# Patient Record
Sex: Male | Born: 1970
Health system: Southern US, Community
[De-identification: ages and names within clinical notes are randomized; demographics above are authoritative.]

## PROBLEM LIST (undated history)

## (undated) DIAGNOSIS — I4891 Unspecified atrial fibrillation: Secondary | ICD-10-CM

## (undated) DIAGNOSIS — K219 Gastro-esophageal reflux disease without esophagitis: Secondary | ICD-10-CM

## (undated) DIAGNOSIS — H18603 Keratoconus, unspecified, bilateral: Secondary | ICD-10-CM

## (undated) DIAGNOSIS — G4730 Sleep apnea, unspecified: Secondary | ICD-10-CM

## (undated) DIAGNOSIS — F419 Anxiety disorder, unspecified: Secondary | ICD-10-CM

## (undated) DIAGNOSIS — K5900 Constipation, unspecified: Secondary | ICD-10-CM

## (undated) DIAGNOSIS — I10 Essential (primary) hypertension: Secondary | ICD-10-CM

## (undated) DIAGNOSIS — R251 Tremor, unspecified: Secondary | ICD-10-CM

## (undated) DIAGNOSIS — J302 Other seasonal allergic rhinitis: Secondary | ICD-10-CM

## (undated) DIAGNOSIS — J309 Allergic rhinitis, unspecified: Secondary | ICD-10-CM

## (undated) DIAGNOSIS — D649 Anemia, unspecified: Secondary | ICD-10-CM

## (undated) DIAGNOSIS — F32A Depression, unspecified: Secondary | ICD-10-CM

## (undated) DIAGNOSIS — Z8614 Personal history of Methicillin resistant Staphylococcus aureus infection: Secondary | ICD-10-CM

## (undated) HISTORY — DX: Unspecified atrial fibrillation: I48.91

## (undated) HISTORY — DX: Depression, unspecified: F32.A

## (undated) HISTORY — DX: Tremor, unspecified: R25.1

## (undated) HISTORY — DX: Allergic rhinitis, unspecified: J30.9

## (undated) HISTORY — DX: Keratoconus, unspecified, bilateral: H18.603

## (undated) HISTORY — DX: Anxiety disorder, unspecified: F41.9

## (undated) HISTORY — DX: Gastro-esophageal reflux disease without esophagitis: K21.9

## (undated) HISTORY — DX: Constipation, unspecified: K59.00

## (undated) HISTORY — DX: Personal history of Methicillin resistant Staphylococcus aureus infection: Z86.14

## (undated) HISTORY — DX: Anemia, unspecified: D64.9

## (undated) HISTORY — DX: Essential (primary) hypertension: I10

## (undated) HISTORY — DX: Sleep apnea, unspecified: G47.30

## (undated) HISTORY — DX: Other seasonal allergic rhinitis: J30.2

---

## 1997-09-29 ENCOUNTER — Emergency Department (HOSPITAL_COMMUNITY): Admission: EM | Admit: 1997-09-29 | Discharge: 1997-09-29 | Payer: Self-pay | Admitting: Emergency Medicine

## 1997-09-29 ENCOUNTER — Ambulatory Visit (HOSPITAL_COMMUNITY): Admission: RE | Admit: 1997-09-29 | Discharge: 1997-09-29 | Payer: Self-pay | Admitting: Emergency Medicine

## 2000-04-06 ENCOUNTER — Emergency Department (HOSPITAL_COMMUNITY): Admission: EM | Admit: 2000-04-06 | Discharge: 2000-04-06 | Payer: Self-pay | Admitting: Emergency Medicine

## 2000-04-11 ENCOUNTER — Emergency Department (HOSPITAL_COMMUNITY): Admission: EM | Admit: 2000-04-11 | Discharge: 2000-04-11 | Payer: Self-pay | Admitting: Internal Medicine

## 2001-07-22 ENCOUNTER — Encounter: Payer: Self-pay | Admitting: Emergency Medicine

## 2001-07-22 ENCOUNTER — Emergency Department (HOSPITAL_COMMUNITY): Admission: EM | Admit: 2001-07-22 | Discharge: 2001-07-22 | Payer: Self-pay | Admitting: *Deleted

## 2001-11-29 ENCOUNTER — Encounter: Payer: Self-pay | Admitting: Emergency Medicine

## 2001-11-29 ENCOUNTER — Emergency Department (HOSPITAL_COMMUNITY): Admission: EM | Admit: 2001-11-29 | Discharge: 2001-11-29 | Payer: Self-pay

## 2004-01-06 ENCOUNTER — Ambulatory Visit: Payer: Self-pay | Admitting: Internal Medicine

## 2004-04-08 ENCOUNTER — Emergency Department (HOSPITAL_COMMUNITY): Admission: EM | Admit: 2004-04-08 | Discharge: 2004-04-08 | Payer: Self-pay | Admitting: Family Medicine

## 2004-04-12 ENCOUNTER — Emergency Department (HOSPITAL_COMMUNITY): Admission: EM | Admit: 2004-04-12 | Discharge: 2004-04-12 | Payer: Self-pay | Admitting: Family Medicine

## 2004-08-01 ENCOUNTER — Emergency Department (HOSPITAL_COMMUNITY): Admission: EM | Admit: 2004-08-01 | Discharge: 2004-08-01 | Payer: Self-pay | Admitting: Emergency Medicine

## 2004-09-08 ENCOUNTER — Emergency Department (HOSPITAL_COMMUNITY): Admission: EM | Admit: 2004-09-08 | Discharge: 2004-09-08 | Payer: Self-pay | Admitting: Emergency Medicine

## 2004-09-11 ENCOUNTER — Emergency Department (HOSPITAL_COMMUNITY): Admission: AD | Admit: 2004-09-11 | Discharge: 2004-09-11 | Payer: Self-pay | Admitting: Family Medicine

## 2005-06-05 ENCOUNTER — Emergency Department (HOSPITAL_COMMUNITY): Admission: EM | Admit: 2005-06-05 | Discharge: 2005-06-05 | Payer: Self-pay | Admitting: Family Medicine

## 2005-11-07 ENCOUNTER — Emergency Department (HOSPITAL_COMMUNITY): Admission: EM | Admit: 2005-11-07 | Discharge: 2005-11-07 | Payer: Self-pay | Admitting: Emergency Medicine

## 2006-01-12 ENCOUNTER — Emergency Department (HOSPITAL_COMMUNITY): Admission: EM | Admit: 2006-01-12 | Discharge: 2006-01-12 | Payer: Self-pay | Admitting: Family Medicine

## 2006-02-23 ENCOUNTER — Ambulatory Visit: Payer: Self-pay | Admitting: Internal Medicine

## 2006-12-31 ENCOUNTER — Encounter: Payer: Self-pay | Admitting: *Deleted

## 2006-12-31 DIAGNOSIS — Z8679 Personal history of other diseases of the circulatory system: Secondary | ICD-10-CM | POA: Insufficient documentation

## 2006-12-31 DIAGNOSIS — I10 Essential (primary) hypertension: Secondary | ICD-10-CM | POA: Insufficient documentation

## 2007-02-25 ENCOUNTER — Emergency Department (HOSPITAL_COMMUNITY): Admission: EM | Admit: 2007-02-25 | Discharge: 2007-02-25 | Payer: Self-pay | Admitting: Family Medicine

## 2007-03-06 ENCOUNTER — Emergency Department (HOSPITAL_COMMUNITY): Admission: EM | Admit: 2007-03-06 | Discharge: 2007-03-06 | Payer: Self-pay | Admitting: Family Medicine

## 2007-06-05 ENCOUNTER — Encounter: Payer: Self-pay | Admitting: Family

## 2010-01-05 ENCOUNTER — Encounter: Payer: Self-pay | Admitting: Family

## 2010-03-20 ENCOUNTER — Encounter: Payer: Self-pay | Admitting: Family

## 2010-03-22 ENCOUNTER — Ambulatory Visit: Admit: 2010-03-22 | Payer: Self-pay | Admitting: Family

## 2010-03-25 ENCOUNTER — Encounter: Payer: Self-pay | Admitting: Family

## 2010-03-25 ENCOUNTER — Ambulatory Visit: Payer: Self-pay | Admitting: Family

## 2010-03-25 DIAGNOSIS — I517 Cardiomegaly: Secondary | ICD-10-CM | POA: Insufficient documentation

## 2010-03-30 NOTE — Assessment & Plan Note (Signed)
Summary: TO RE-EST/HEA-- rm 5   Vital Signs:  Patient profile:   40 year old male Height:      68.75 inches Weight:      230 pounds BMI:     34.34 Temp:     98.5 degrees F oral Pulse rate:   60 / minute Pulse rhythm:   regular Resp:     18 per minute BP sitting:   126 / 70  (right arm) Cuff size:   large  Vitals Entered By: Mervin Kung CMA Duncan Dull) (March 25, 2010 2:57 PM) Is Patient Diabetic? No Pain Assessment Patient in pain? no      Comments Pt seen Sunday by [facility], diagnosed with enlarged heart and bronchitis. Will call for records.  Nicki Guadalajara Fergerson CMA Duncan Dull)  March 25, 2010 3:03 PM    History of Present Illness: Mr.  Devin [name] is a 40 year old male who presents today to re-establish care.  He has seen Dr. Artist Pais in the past- his last visit was in 2008.    1) Bronchitis- Was seen at [facility] on Sunday with Bronchitis and was found to have an enlarged heart.  Denies history of Rheumatic fever.  Denies shortness of breath.  Today notes some ches discomfort from all of his coughing.  Cough was dry.  Was treated with Zithromax, tessalon perles and an albuterol inhaler.    2) GERD-  uses ranitidine as needed, but has not needed lately.    3) HTN- reports that his BP improved with weight loss.    Preventive Screening-Counseling & Management  Alcohol-Tobacco     Alcohol drinks/day: 0     Smoking Status: never  Caffeine-Diet-Exercise     Caffeine use/day: 8 drinks daily     Does Patient Exercise: yes     Type of exercise: running     Times/week: 5  Allergies: 1)  ! Penicillin  Past History:  Past Medical History: Hx of HYPERTENSION, CONTROLLED (ICD-401.1) HEART MURMUR, HX OF (ICD-V12.50) Hx of MRSA      Past Surgical History: none reported  Family History: father-- living, HTN mother-- living,  CHF, diabetes, hypercholesterolemia, thyroid disease (graves disease) heavy smoker  2  brothers A&W 5 sisters-- 2 with thyroid  disease  63 Daughter-1 with asthma & ADHD 72 Daughter- alive and well 2004 Daughter- alive and well  Social History: Occupation: Education administrator  Married Never Smoked Alcohol use-no Regular exercise-yes (runs and uses elliptical machine)  Quit school in the 8th gread Caffeine use/day:  8 drinks daily Does Patient Exercise:  yes  Review of Systems       Constitutional: Denies Fever ENT:  + nasal congestion or sore throat. Resp: + cough (improving) CV:  denies palpitations GI:  Denies nausea or vomitting, or diarrhea GU: Denies dysuria Lymphatic: Denies lymphadenopathy Musculoskeletal:  Denies muscle/joint pain Skin:  Denies Rashes Psychiatric: Denies depression or anxiety- wife notes "severe mood swings." Neuro: occasional numbness of right hand with prolonged driving or sleep.       Physical Exam  General:  Well-developed,well-nourished,in no acute distress; alert,appropriate and cooperative throughout examination Head:  Normocephalic and atraumatic without obvious abnormalities. No apparent alopecia or balding. Eyes:  PERRLA, sclera are clear Ears:  External ear exam shows no significant lesions or deformities.  Otoscopic examination reveals clear canals, tympanic membranes are intact bilaterally without bulging, retraction, inflammation or discharge. Hearing is grossly normal bilaterally. Mouth:  Oral mucosa and oropharynx without lesions or exudates.  Teeth in good  repair. Neck:  No deformities, masses, or tenderness noted. No thyroid enlargement noted Lungs:  Normal respiratory effort, chest expands symmetrically. Lungs are clear to auscultation, no crackles or wheezes. Heart:  PMI is palpated in 6th intercostal space  s1, s2, RRR,  soft grade 1/6 systolic murmur Extremities:  No clubbing, cyanosis, edema, or deformity noted with normal full range of motion of all joints.   Skin:  Dry skin noted, no rashes or induration noted. Psych:  Cognition and judgment appear  intact. Alert and cooperative with normal attention span and concentration. No apparent delusions, illusions, hallucinations   Impression & Recommendations:  Problem # 1:  CARDIOMEGALY (ICD-429.3) Assessment New New finding per patient.  Need to rule out valve disease or CHF.  Records requested from [facility].  May be due to history of hypertension.  Pt is noted to have a soft murmur.  Will request 2-d echo.  Sinus bradycardia on EKG today.  Rate was noted to be 52-60 on two separate readings while here.   Orders: Misc. Referral (Misc. Ref)  Problem # 2:  Hx of HYPERTENSION, CONTROLLED (ICD-401.1) Assessment: Comment Only Patient reports that he has lost 31 pounds since October with exercise.   BP is stable today.    Patient Instructions: 1)  You will be contacted about your referral for your echocardiogram at [facility]. 2)  Please follow up in 1 month for a complete physical. 3)  Come fasting to this appointment.   Orders Added: 1)  Misc. Referral [Misc. Ref] 2)  New Patient Level IV [14782]    Current Allergies (reviewed today): ! PENICILLIN     Preventive Care Screening  Last Tetanus Booster:    Date:  02/21/2008    Results:  Historical     Vital Signs:  Patient Profile:   40 year old male Height:     68.75 inches Weight:      230 pounds BMI:     34.34 Temp:     98.5 degrees F oral Pulse rate:   60 / minute Pulse rhythm:   regular Resp:     18 per minute BP sitting:   126 / 70 Cuff size:   large

## 2010-03-31 ENCOUNTER — Encounter: Payer: Self-pay | Admitting: Family

## 2010-04-01 ENCOUNTER — Telehealth: Payer: Self-pay | Admitting: Family

## 2010-04-04 ENCOUNTER — Ambulatory Visit (HOSPITAL_COMMUNITY): Payer: Self-pay | Attending: Internal Medicine

## 2010-04-04 DIAGNOSIS — I10 Essential (primary) hypertension: Secondary | ICD-10-CM | POA: Insufficient documentation

## 2010-04-04 DIAGNOSIS — I079 Rheumatic tricuspid valve disease, unspecified: Secondary | ICD-10-CM | POA: Insufficient documentation

## 2010-04-04 DIAGNOSIS — I517 Cardiomegaly: Secondary | ICD-10-CM | POA: Insufficient documentation

## 2010-04-04 DIAGNOSIS — I379 Nonrheumatic pulmonary valve disorder, unspecified: Secondary | ICD-10-CM | POA: Insufficient documentation

## 2010-04-07 NOTE — Progress Notes (Signed)
Summary: records received  Phone Note Other Incoming   Summary of Call: Records received from [facility] and forwarded to Provider for review.. Initial call taken by: Mervin Kung CMA Duncan Dull),  April 01, 2010 10:28 AM

## 2010-04-11 ENCOUNTER — Telehealth: Payer: Self-pay | Admitting: Family

## 2010-04-12 ENCOUNTER — Telehealth: Payer: Self-pay | Admitting: Family

## 2010-04-13 NOTE — Letter (Signed)
Summary: Records from [facility] 2009  Records from [facility] 2009   Imported By: Maryln Gottron 04/08/2010 11:05:22  _____________________________________________________________________  External Attachment:    Type:   Image     Comment:   External Document

## 2010-04-19 NOTE — Progress Notes (Signed)
Summary: records received  Phone Note From Other Clinic   Summary of Call: Received records from [facility] and [facility] and forwarded to Provider for review. Initial call taken by: Mervin Kung CMA Duncan Dull),  April 11, 2010 4:37 PM

## 2010-04-19 NOTE — Progress Notes (Signed)
  Phone Note Outgoing Call   Call placed by: Lemont Fillers FNP,  April 12, 2010 8:38 AM Call placed to: Patient Summary of Call: Echo results reviewed with patient. Initial call taken by: Lemont Fillers FNP,  April 12, 2010 8:38 AM

## 2010-04-28 NOTE — Letter (Signed)
Summary: Records from [facility] 2010 - 2011  Records from [facility] 2010 - 2011   Imported By: Maryln Gottron 04/20/2010 14:15:39  _____________________________________________________________________  External Attachment:    Type:   Image     Comment:   External Document

## 2010-04-28 NOTE — Letter (Signed)
Summary: Records from [facility] 2006 - 2012  Records from [facility] 2006 - 2012   Imported By: Maryln Gottron 04/20/2010 14:13:47  _____________________________________________________________________  External Attachment:    Type:   Image     Comment:   External Document

## 2010-04-29 ENCOUNTER — Other Ambulatory Visit: Payer: Self-pay | Admitting: Family

## 2010-04-29 ENCOUNTER — Encounter: Payer: Self-pay | Admitting: Family

## 2010-04-29 ENCOUNTER — Encounter (INDEPENDENT_AMBULATORY_CARE_PROVIDER_SITE_OTHER): Payer: Self-pay | Admitting: Family

## 2010-04-29 DIAGNOSIS — H18609 Keratoconus, unspecified, unspecified eye: Secondary | ICD-10-CM | POA: Insufficient documentation

## 2010-04-29 DIAGNOSIS — Z0000 Encounter for general adult medical examination without abnormal findings: Secondary | ICD-10-CM

## 2010-04-29 DIAGNOSIS — F418 Other specified anxiety disorders: Secondary | ICD-10-CM | POA: Insufficient documentation

## 2010-04-29 LAB — CONVERTED CEMR LAB
Blood Glucose, AC Bkfst: 83 mg/dL
TSH: 1.547 microintl units/mL (ref 0.350–4.500)

## 2010-04-29 LAB — TSH: TSH: 1.547 u[IU]/mL (ref 0.350–4.500)

## 2010-05-02 ENCOUNTER — Encounter: Payer: Self-pay | Admitting: Family

## 2010-05-03 NOTE — Assessment & Plan Note (Signed)
Summary: cpemhf--rm 4   Vital Signs:  Patient profile:   40 year old male Height:      68.75 inches Weight:      218.25 pounds BMI:     0.04 Temp:     97.9 degrees F oral Pulse rate:   72 / minute Pulse rhythm:   regular Resp:     16 per minute BP sitting:   128 / 70  (right arm) Cuff size:   large  Vitals Entered By: Mervin Kung CMA Duncan Dull) (April 29, 2010 8:41 AM) CC: Pt here for physical, fasting. Is Patient Diabetic? No Pain Assessment Patient in pain? no        Primary Care Provider:  Lemont Fillers FNP  CC:  Pt here for physical and fasting.Marland Kitchen  History of Present Illness: Mr. Ahn is a 40 year old male who presents today for his complete physical.  Preventative-  runs 2-5 days a week for 35 minutes each time.  (5K) Diet- eats too many biscuits.  Otherwise diet is healthy.  Immunizations up to date.    Irritability-  Pt notes that he becomes easily frustrated with his family.  Denies suicide ideation.  Denies buying sprees/sexual indiscretions.  Doesn't sleep well.  Usually wakes up several times a night.  Can only sleep for about 5 hours.  Poor libido.   Keratoconus eye disease- tells me that he has seen several opthalmologists for this.  Notes that vision is poor.  Has trouble seeing at night when he is driving.    Preventive Screening-Counseling & Management  Alcohol-Tobacco     Alcohol drinks/day: 0     Smoking Status: never  Caffeine-Diet-Exercise     Caffeine use/day: 8 drinks daily     Does Patient Exercise: yes     Type of exercise: running     Times/week: 5  Allergies: 1)  ! Penicillin  Past History:  Past Surgical History: Last updated: 03/25/2010 none reported  Family History: Last updated: 03/25/2010 father-- living, HTN mother-- living,  CHF, diabetes, hypercholesterolemia, thyroid disease (graves disease) heavy smoker  2  brothers A&W 5 sisters-- 2 with thyroid disease  59 Daughter-1 with asthma & ADHD 53  Daughter- alive and well 2004 Daughter- alive and well  Social History: Last updated: 03/25/2010 Occupation: Education administrator  Married Never Smoked Alcohol use-no Regular exercise-yes (runs and uses elliptical machine)  Quit school in the 8th gread  Past Medical History: Hx of HYPERTENSION, CONTROLLED (ICD-401.1) HEART MURMUR, HX OF (ICD-V12.50) Hx of MRSA Keratoconus eye disease      Family History: Reviewed history from 03/25/2010 and no changes required. father-- living, HTN mother-- living,  CHF, diabetes, hypercholesterolemia, thyroid disease (graves disease) heavy smoker  2  brothers A&W 5 sisters-- 2 with thyroid disease  82 Daughter-1 with asthma & ADHD 53 Daughter- alive and well 2004 Daughter- alive and well  Social History: Reviewed history from 03/25/2010 and no changes required. Occupation: [name]  Married Never Smoked Alcohol use-no Regular exercise-yes (runs and uses elliptical machine)  Quit school in the 8th gread  Review of Systems          General:  Denies fever and sweats. Eyes:  reports that he has seen multiple opthalmologists for his vision problems.. ENT:  Denies earache and sore throat. CV:  Denies palpitations; denies chest pain. Resp:  Denies cough and shortness of breath. GI:  Denies bloody stools and diarrhea. GU:  Denies dysuria and nocturia. MS:  bilateral knee pain, no  joint swelling. Derm:  Complains of dryness; denies rash; + mole under left arm. Neuro:  Denies numbness and weakness. Psych:  Complains of easily angered; denies anxiety and depression; Feels that stress plays a role. Endo:  Denies excessive thirst and excessive urination.  Physical Exam  General:  Well-developed,well-nourished,in no acute distress; alert,appropriate and cooperative throughout examination Head:  Normocephalic and atraumatic without obvious abnormalities. No apparent alopecia or balding. Eyes:  PERRLA, sclera clear. Ears:  External ear exam  shows no significant lesions or deformities.  Otoscopic examination reveals clear canals, tympanic membranes are intact bilaterally without bulging, retraction, inflammation or discharge. Hearing is grossly normal bilaterally. Mouth:  Oral mucosa and oropharynx without lesions or exudates.   Neck:  No deformities, masses, or tenderness noted. Lungs:  Normal respiratory effort, chest expands symmetrically. Lungs are clear to auscultation, no crackles or wheezes. Heart:  Normal rate and regular rhythm. S1 and S2 normal without gallop, murmur, click, rub or other extra sounds. Abdomen:  Bowel sounds positive,abdomen soft and non-tender without masses, organomegaly or hernias noted. Msk:  No deformity or scoliosis noted of thoracic or lumbar spine.   Extremities:  No clubbing, cyanosis, edema, or deformity noted with normal full range of motion of all joints.   Neurologic:  No cranial nerve deficits noted. Station and gait are normal. Plantar reflexes are down-going bilaterally. DTRs are symmetrical throughout. Sensory, motor and coordinative functions appear intact. Skin:  Intact without suspicious lesions or rashes.  + skin tag left axilla, freckle noted on right cheek.   Cervical Nodes:  No lymphadenopathy noted Psych:  Pt tearful during his visit today, pleasant and cooperative. Oriented X3, not suicidal, and not homicidal.  No apparent delusions.   Impression & Recommendations:  Problem # 1:  Preventive Health Care (ICD-V70.0)  Patient counseled on diet, exercise and weight loss.  Immunizations reviewed and up to date.     He has been doing great with his weight loss.  He has lost approx 13 pounds since his last visit.  Checked fasting CBG in office.  Strong family hx of thyroid dz.  Will check TSH.  Pt wishes to defer lipid panel to another visit due to cost.   Orders: T-TSH ([address]-60454) Fingerstick ([address]) Glucose, (CBG) (91478)  Problem # 2:  KERATOCONUS (ICD-371.60) Assessment:  Comment Only Pt has seen multiple opthalmologists- including [facility] in [place].  Reports difficulty seeing at night.  He was advised not to drive at night or during bad weather.  Will try to obtain his medical records from the specialists.  Problem # 3:  DEPRESSION (ICD-311) Assessment: New Pt completed PHQ9 today and pt scored: moderately severe depression.  Will add sertraline.  Pt couseled on side effects and f/u as noted in pt instructions.  Plan f/u in 1 month.  His updated medication list for this problem includes:    Sertraline Hcl 50 Mg Tabs (Sertraline hcl) ..... One tablet by mouth daily  Complete Medication List: 1)  Sertraline Hcl 50 Mg Tabs (Sertraline hcl) .... One tablet by mouth daily  Patient Instructions: 1)  Please start 1/2 tablet one a day for one week, then increase to a full tablet. 2)  It will likely take several weeks before you will notice improvement. 3)  Side effects of this medicine may include drowsiness or nausea.  If this becomes an issue for you call for further instructions. 4)  Very rarely people may develop suicidal thoughts when taking these types of medicines- should this happen  to you, discontinue medication and go directly to the emergency room. 5)  Please arrange a follow up appointment in 1 month  Prescriptions: SERTRALINE HCL 50 MG TABS (SERTRALINE HCL) one tablet by mouth daily  #30 x 1   Entered and Authorized by:   Lemont Fillers FNP   Signed by:   Lemont Fillers FNP on 04/29/2010   Method used:   Electronically to        [facility]  [address] [phone]* (retail)       [address]       [place], [place]  [address]       Ph: [phone] or [phone]       Fax: [phone]   RxID:   [id number]    Orders Added: 1)  T-TSH [[id number]] 2)  Fingerstick [36416] 3)  Glucose, (CBG) [82962] 4)  Est. Patient Level II [87564]    Current Allergies (reviewed today): ! PENICILLIN    Laboratory Results     Blood Tests    Date/Time Reported: Mervin Kung CMA Duncan Dull)  April 29, 2010 9:52 AM   CBG Fasting:: 83mg /dL

## 2010-05-10 NOTE — Letter (Signed)
   [place] [facility] [address] [place], [place]  [address]  [place] Phone: [phone]      May 02, 2010   [facility] [place] [address] [place], [place] [address]  RE:  LAB RESULTS  Dear  Mr. Goostree,  The following is an interpretation of your most recent lab tests.  Please take note of any instructions provided or changes to medications that have resulted from your lab work.  THYROID STUDIES:  Thyroid studies normal TSH: 1.547      Sincerely Yours,    Lemont Fillers FNP  Appended Document:  mailed

## 2010-05-10 NOTE — Letter (Signed)
Summary: Patient Health Questionnaire  Patient Health Questionnaire   Imported By: Maryln Gottron 05/05/2010 14:52:36  _____________________________________________________________________  External Attachment:    Type:   Image     Comment:   External Document

## 2010-05-26 ENCOUNTER — Encounter: Payer: Self-pay | Admitting: Family

## 2010-05-30 ENCOUNTER — Encounter: Payer: Self-pay | Admitting: Family

## 2010-05-30 ENCOUNTER — Ambulatory Visit: Payer: Self-pay | Admitting: Family

## 2010-05-30 VITALS — BP 116/64 | HR 56 | Temp 97.8°F | Resp 16 | Ht 68.74 in | Wt 213.0 lb

## 2010-05-30 DIAGNOSIS — F329 Major depressive disorder, single episode, unspecified: Secondary | ICD-10-CM

## 2010-05-30 MED ORDER — CITALOPRAM HYDROBROMIDE 20 MG PO TABS: 20.0000 mg | ORAL_TABLET | Freq: Every day | ORAL | Status: DC

## 2010-05-30 NOTE — Progress Notes (Signed)
  Subjective:    Patient ID: Devin [name], male    DOB: 1970/09/22, 40 y.o.   MRN: [id number]  HPI  Devin [name] is a 40 year old male who presents today for follow up of his depression.  Since starting sertraline, he has felt more lethargic.  Trouble getting up in the AM, now taking naps.  Still working out regularly.  His temper is improved.  Daughter recently broke her leg, and is very busy with his children.  Using "a lot of energy drinks."     Review of Systems  Constitutional: Positive for fatigue.  Neurological: Negative for weakness.  Psychiatric/Behavioral:       Denies anxiety or panic attacks.   Past Medical History  Diagnosis Date  . Hypertension     controlled  . History of MRSA infection   . Keratoconus of both eyes     History   Social History  . Marital Status: Married    Spouse Name: N/A    Number of Children: 3  . Years of Education: N/A   Occupational History  . [name]    Social History Main Topics  . Smoking status: Never Smoker   . Smokeless tobacco: Never Used  . Alcohol Use: No  . Drug Use: Not on file  . Sexually Active: Not on file   Other Topics Concern  . Not on file   Social History Narrative   Regular exercise: yes (runs and uses elliptical machine)Quit school in the 8th grade.    No past surgical history on file.  Family History  Problem Relation Age of Onset  . Diabetes Mother   . Hyperlipidemia Mother   . Heart disease Mother     CHF  . Thyroid disease Mother     Graves Disease  . Hypertension Father   . Asthma Daughter   . ADD / ADHD Daughter     Allergies  Allergen Reactions  . Penicillins     REACTION: causes rash and sob.    No current outpatient prescriptions on file prior to visit.    BP 116/64  Pulse 56  Temp(Src) 97.8 F (36.6 C) (Oral)  Resp 16  Ht 5' 8.74" (1.746 m)  Wt 213 lb (96.616 kg)  BMI 31.69 kg/m2        Objective:   Physical Exam  Constitutional: He appears well-developed and  well-nourished.  Cardiovascular: Normal rate and regular rhythm.   Pulmonary/Chest: Effort normal and breath sounds normal.  Psychiatric: He has a normal mood and affect. His behavior is normal.          Assessment & Plan:

## 2010-05-30 NOTE — Patient Instructions (Addendum)
Stop zoloft, start citalopram once daily. Call if you develop worsening depression or if you are very tired after starting this medicine.

## 2010-06-01 NOTE — Assessment & Plan Note (Signed)
Depression is improving, however he is feeling too drowsy on the sertraline. We'll give him trial of citalopram. Have patient follow up in one-month. 15 minutes spent with the patient today, greater than 50% of this time was spent counseling the patient on his depression.

## 2010-06-29 ENCOUNTER — Ambulatory Visit: Payer: Self-pay | Admitting: Family

## 2010-09-06 ENCOUNTER — Other Ambulatory Visit: Payer: Self-pay | Admitting: Family

## 2010-09-07 NOTE — Telephone Encounter (Signed)
Received refill request for Citalopram. Sent 30 day supply to pharmacy with note to schedule appt for future refills. Judeth Cornfield, can you call pt to schedule f/u.

## 2010-09-07 NOTE — Telephone Encounter (Signed)
Left detailed message for patient stating that 30 day refill was sent to pharmacy and that he needs to schedule a follow up appt.

## 2010-10-07 ENCOUNTER — Telehealth: Payer: Self-pay | Admitting: Family

## 2010-10-07 ENCOUNTER — Ambulatory Visit: Payer: Self-pay | Admitting: Family

## 2010-10-07 DIAGNOSIS — F329 Major depressive disorder, single episode, unspecified: Secondary | ICD-10-CM

## 2010-10-07 MED ORDER — CITALOPRAM HYDROBROMIDE 20 MG PO TABS: 20.0000 mg | ORAL_TABLET | Freq: Every day | ORAL | Status: DC

## 2010-10-07 NOTE — Telephone Encounter (Signed)
8/17 appt cancelled due to provider.  RX for 30 days sent to pharmacy.

## 2010-10-07 NOTE — Telephone Encounter (Signed)
We had to rsch appt for 10-07-10. appt was for med refill. Can we refill citalopram to last patient until his next appt? [facility] in [place].

## 2010-11-09 LAB — POCT RAPID STREP A: Streptococcus, Group A Screen (Direct): NEGATIVE

## 2010-11-23 ENCOUNTER — Other Ambulatory Visit: Payer: Self-pay | Admitting: *Deleted

## 2010-11-23 DIAGNOSIS — F329 Major depressive disorder, single episode, unspecified: Secondary | ICD-10-CM

## 2010-11-23 MED ORDER — CITALOPRAM HYDROBROMIDE 20 MG PO TABS
20.0000 mg | ORAL_TABLET | Freq: Every day | ORAL | Status: DC
Start: 2010-11-23 — End: 2010-12-09

## 2010-11-23 NOTE — Telephone Encounter (Signed)
Received call from pt's wife stating pt has been working out of town and unable to arrange follow up. Pt is almost out of Citalopram and is requesting refill until he can be seen. Olena Heckle that we could give a 2 week supply until pt is seen. Appt scheduled for 12/02/10 at 3:45pm. Refill sent to pharmacy.

## 2010-12-02 ENCOUNTER — Ambulatory Visit: Payer: Self-pay | Admitting: Family

## 2010-12-09 ENCOUNTER — Ambulatory Visit: Payer: Self-pay | Admitting: Family

## 2010-12-09 ENCOUNTER — Other Ambulatory Visit: Payer: Self-pay | Admitting: *Deleted

## 2010-12-09 DIAGNOSIS — F329 Major depressive disorder, single episode, unspecified: Secondary | ICD-10-CM

## 2010-12-09 DIAGNOSIS — F3289 Other specified depressive episodes: Secondary | ICD-10-CM

## 2010-12-09 MED ORDER — CITALOPRAM HYDROBROMIDE 20 MG PO TABS: 20.0000 mg | ORAL_TABLET | Freq: Every day | ORAL | Status: DC

## 2010-12-09 NOTE — Telephone Encounter (Signed)
OK to send a 1 week supply.

## 2010-12-09 NOTE — Telephone Encounter (Signed)
Received call from pt's wife that pt's mom is in the hospital dying and he is unable to come in for his appt today. Pt is requesting that we send in a 1 week supply until he can be seen. Please advise.

## 2010-12-09 NOTE — Telephone Encounter (Signed)
Notified pt's wife and sent 1 week supply to pharmacy.

## 2010-12-19 ENCOUNTER — Encounter: Payer: Self-pay | Admitting: Family

## 2010-12-19 ENCOUNTER — Ambulatory Visit: Payer: Self-pay | Admitting: Family

## 2010-12-19 DIAGNOSIS — F329 Major depressive disorder, single episode, unspecified: Secondary | ICD-10-CM

## 2010-12-19 MED ORDER — DULOXETINE HCL 60 MG PO CPEP: 60.0000 mg | ORAL_CAPSULE | Freq: Every day | ORAL | Status: DC

## 2010-12-19 NOTE — Patient Instructions (Addendum)
Weigh yourself once weekly.   Call if you continue to gain weight.  Follow up in 6 weeks.

## 2010-12-19 NOTE — Assessment & Plan Note (Signed)
Improved. I am concerned about his weight gain however as a side effect of the citalopram. Will transition him to cymbalta and will help initiate patient assist.  Samples provided today.  Pt was encouraged to resume his work out routine and to work on his [employer]. He is to follow up in 6 weeks.

## 2010-12-19 NOTE — Progress Notes (Signed)
  Subjective:    Patient ID: Devin [name], male    DOB: Jun 16, 1970, 40 y.o.   MRN: [id number]  HPI  [name] is a 40 yr old male who presents today for follow up of his depression.  He was last seen in April.  Depression-Last visit he was noted to be sleepy during the day on sertraline.  He is less sleepy on citalopram.  Feels that his depression is better controlled.   He feels less irritable.  Denies suicide ideation or homocidal ideation.  He stopped working out 2 months ago. Notes that his diet has not been healthy. He has gained 20 pounds since his last visit. Has restarted weight watchers this week.  Wants to run to tonight.    Review of Systems    Objective:   Physical Exam  Constitutional: He appears well-developed and well-nourished.  Psychiatric: He has a normal mood and affect. His behavior is normal. Judgment and thought content normal.          Assessment & Plan:

## 2011-01-10 ENCOUNTER — Other Ambulatory Visit: Payer: Self-pay | Admitting: *Deleted

## 2011-01-10 MED ORDER — DULOXETINE HCL 60 MG PO CPEP: 60.0000 mg | ORAL_CAPSULE | Freq: Every day | ORAL | Status: DC

## 2011-01-10 NOTE — Telephone Encounter (Signed)
Message copied by Kathi Simpers on Tue Jan 10, 2011  9:51 AM ------      Message from: [place], MELISSA      Created: Mon Dec 19, 2010  4:19 PM       Could you pls initiate pt assistance for cymbalta? thanks

## 2011-01-10 NOTE — Telephone Encounter (Signed)
Pt assistance form for Cymbalta printed from [employer]. Pt has been notified and will stop by office on Friday to complete application and provide proof of income. Form has been left at front desk. Samples left at front desk for pt to pick up on Friday.

## 2011-01-18 MED ORDER — DULOXETINE HCL 60 MG PO CPEP: 60.0000 mg | ORAL_CAPSULE | Freq: Every day | ORAL | Status: DC

## 2011-01-18 NOTE — Telephone Encounter (Signed)
Pt dropped off financial documentation and signed application yesterday. Rx printed and forwarded to Provider for signature and completion of application.

## 2011-01-18 NOTE — Telephone Encounter (Signed)
Rx signed and mailed with application to: [facility] [address] [place], [place]  [address]  A refill/reorder form will be included with each shipment and will need to be completed and returned to get next shipment.

## 2011-01-30 ENCOUNTER — Ambulatory Visit: Payer: Self-pay | Admitting: Family

## 2011-01-30 ENCOUNTER — Encounter: Payer: Self-pay | Admitting: Family

## 2011-01-30 DIAGNOSIS — F329 Major depressive disorder, single episode, unspecified: Secondary | ICD-10-CM

## 2011-01-30 MED ORDER — DULOXETINE HCL 60 MG PO CPEP: 60.0000 mg | ORAL_CAPSULE | Freq: Every day | ORAL | Status: DC

## 2011-01-30 NOTE — Patient Instructions (Signed)
Please follow up in 4 months, sooner if problems or concerns.  

## 2011-01-30 NOTE — Progress Notes (Signed)
  Subjective:    Patient ID: Devin [name], male    DOB: [birth year], 41 y.o.   MRN: [id number]  HPI  [name] is a 40 yr old male who presents today for follow up of his depression.  He is currently on cymbalta.  Reports that his depression is well controlled. He does note that his mother died 1 month ago.  He was not close to her.  So far he hasn't been very emotional about her death. He recently started back to the gym.  He has been running 25 miles a week.  He reports improved energy.    Review of Systems See HPI  Past Medical History  Diagnosis Date  . Hypertension     controlled  . History of MRSA infection   . Keratoconus of both eyes     History   Social History  . Marital Status: Married    Spouse Name: N/A    Number of Children: 3  . Years of Education: N/A   Occupational History  . [name]    Social History Main Topics  . Smoking status: Never Smoker   . Smokeless tobacco: Never Used  . Alcohol Use: No  . Drug Use: Not on file  . Sexually Active: Not on file   Other Topics Concern  . Not on file   Social History Narrative   Regular exercise: yes (runs and uses elliptical machine)Quit school in the 8th grade.    No past surgical history on file.  Family History  Problem Relation Age of Onset  . Diabetes Mother   . Hyperlipidemia Mother   . Heart disease Mother     CHF  . Thyroid disease Mother     Graves Disease  . Hypertension Father   . Asthma Daughter   . ADD / ADHD Daughter     Allergies  Allergen Reactions  . Penicillins     REACTION: causes rash and sob.    No current outpatient prescriptions on file prior to visit.    BP 120/80  Pulse 72  Temp(Src) 97.9 F (36.6 C) (Oral)  Resp 16  Ht 5\' 8"  (1.727 m)  Wt 227 lb (102.967 kg)  BMI 34.52 kg/m2       Objective:   Physical Exam  Constitutional: He appears well-developed and well-nourished.  Cardiovascular: Normal rate and regular rhythm.   Murmur  heard. Pulmonary/Chest: Effort normal and breath sounds normal. No respiratory distress. He has no wheezes. He has no rales. He exhibits no tenderness.  Psychiatric: He has a normal mood and affect. His behavior is normal. Judgment and thought content normal.          Assessment & Plan:

## 2011-01-31 ENCOUNTER — Telehealth: Payer: Self-pay | Admitting: *Deleted

## 2011-01-31 MED ORDER — DULOXETINE HCL 60 MG PO CPEP: 60.0000 mg | ORAL_CAPSULE | Freq: Every day | ORAL | Status: DC

## 2011-01-31 NOTE — Assessment & Plan Note (Signed)
Improved.  Will continue cymbalta.  15 minutes spent with the pt today.  Nearly all of this time was spent counseling him on his depression.

## 2011-01-31 NOTE — Telephone Encounter (Signed)
Received voice message from pt's wife stating pt is unable to afford Cymbalta Rx and wanted to know the status of pt assistance request. Left detailed message on her voicemail that application was mailed on 01/18/11 and may take up to 4 weeks to get a determination. Advised her I will leave samples x 3 boxes at the front desk to help until pt assistance is determined and to call if any questions.

## 2011-02-07 ENCOUNTER — Telehealth: Payer: Self-pay | Admitting: *Deleted

## 2011-02-07 NOTE — Telephone Encounter (Signed)
Message copied by Kathi Simpers on Tue Feb 07, 2011 10:30 AM ------      Message from: Mervin Kung A      Created: Wed Jan 18, 2011  1:15 PM       Mailed cymbalta pt assist application to [facility]. Awaiting approval/denial status and samples.

## 2011-02-07 NOTE — Telephone Encounter (Signed)
Received approval letter from [facility] that pt has been approved for Cymbalta

## 2011-02-10 ENCOUNTER — Telehealth: Payer: Self-pay | Admitting: *Deleted

## 2011-02-10 NOTE — Telephone Encounter (Signed)
[facility] w/contact name & number that his [patient assistance] medication had arrived at the office and would be available for p/u until 5pm today, 2pm on Mon 12.24.12 and then not again until Wednesday 12.26.12 [Holiday schedule]. Placed at front w/Marjorie.

## 2011-06-05 ENCOUNTER — Ambulatory Visit: Payer: Self-pay | Admitting: Family

## 2011-06-23 ENCOUNTER — Ambulatory Visit: Payer: Self-pay | Admitting: Family

## 2011-06-26 ENCOUNTER — Ambulatory Visit: Payer: Self-pay | Admitting: Family

## 2011-06-26 ENCOUNTER — Encounter: Payer: Self-pay | Admitting: Family

## 2011-06-26 VITALS — BP 150/80 | HR 61 | Temp 98.0°F | Resp 16 | Ht 68.0 in | Wt 256.1 lb

## 2011-06-26 DIAGNOSIS — G8929 Other chronic pain: Secondary | ICD-10-CM | POA: Insufficient documentation

## 2011-06-26 DIAGNOSIS — M25562 Pain in left knee: Secondary | ICD-10-CM | POA: Insufficient documentation

## 2011-06-26 DIAGNOSIS — I10 Essential (primary) hypertension: Secondary | ICD-10-CM

## 2011-06-26 DIAGNOSIS — F329 Major depressive disorder, single episode, unspecified: Secondary | ICD-10-CM

## 2011-06-26 MED ORDER — MELOXICAM 7.5 MG PO TABS: 7.5000 mg | ORAL_TABLET | Freq: Every day | ORAL | Status: DC

## 2011-06-26 NOTE — Assessment & Plan Note (Signed)
BP is up today.  He has gained nearly 30 pounds since December which he attributes to stopping running and overeating.  I have advised him on a low sodium diet, exercise and weight loss.  Follow up in 1 month.  If no improvement, plan to start BP med at that time.

## 2011-06-26 NOTE — Assessment & Plan Note (Signed)
Suspect OA which is exacerbated by recent weight gain.

## 2011-06-26 NOTE — Assessment & Plan Note (Signed)
41 yr old male with history of depression.  Appears well controlled on cymbalta which he is receiving through patient assist.

## 2011-06-26 NOTE — Progress Notes (Signed)
  Subjective:    Patient ID: Devin [name], male    DOB: 07-Apr-1970, 41 y.o.   MRN: [id number]  HPI  Devin [name] is a 41 yr old male who presents today with chief complaint of left knee pain.  Reports that knee started acting up 1 month ago.  Has tried ibuprofen without improvement.  Notes occasional swelling and "locking up on him."  Worse with running.  Hurts beneath the left knee cap.  He reports that he had some mild associated bruising on his knee.   Depression- on cymbalta.    Review of Systems See HPI  Past Medical History  Diagnosis Date  . Hypertension     controlled  . History of MRSA infection   . Keratoconus of both eyes     History   Social History  . Marital Status: Married    Spouse Name: N/A    Number of Children: 3  . Years of Education: N/A   Occupational History  . [name]    Social History Main Topics  . Smoking status: Never Smoker   . Smokeless tobacco: Never Used  . Alcohol Use: No  . Drug Use: Not on file  . Sexually Active: Not on file   Other Topics Concern  . Not on file   Social History Narrative   Regular exercise: yes (runs and uses elliptical machine)Quit school in the 8th grade.    No past surgical history on file.  Family History  Problem Relation Age of Onset  . Diabetes Mother   . Hyperlipidemia Mother   . Heart disease Mother     CHF  . Thyroid disease Mother     Graves Disease  . Hypertension Father   . Asthma Daughter   . ADD / ADHD Daughter     Allergies  Allergen Reactions  . Penicillins     REACTION: causes rash and sob.    Current Outpatient Prescriptions on File Prior to Visit  Medication Sig Dispense Refill  . DULoxetine (CYMBALTA) 60 MG capsule Take 1 capsule (60 mg total) by mouth daily.  21 capsule  0  . Multiple Vitamin (MULTIVITAMIN) tablet Take 1 tablet by mouth daily.          BP 150/80  Pulse 61  Temp(Src) 98 F (36.7 C) (Oral)  Resp 16  Ht 5\' 8"  (1.727 m)  Wt 256 lb 1.3 oz (116.157  kg)  BMI 38.94 kg/m2  SpO2 99%       Objective:   Physical Exam  Constitutional: He appears well-developed and well-nourished. No distress.  Musculoskeletal:       + crepitus bilateral knees.  Left knee is stable:  Neg Drawer sign and no swelling noted          Assessment & Plan:   BP Readings from Last 3 Encounters:  06/26/11 150/80  01/30/11 120/80  12/19/10 130/64

## 2011-06-26 NOTE — Patient Instructions (Signed)
Please follow up in 1 month.  

## 2011-07-21 ENCOUNTER — Ambulatory Visit: Payer: Self-pay | Admitting: Family

## 2011-07-24 ENCOUNTER — Telehealth: Payer: Self-pay | Admitting: *Deleted

## 2011-07-24 MED ORDER — DULOXETINE HCL 60 MG PO CPEP: 60.0000 mg | ORAL_CAPSULE | Freq: Every day | ORAL | Status: DC

## 2011-07-24 NOTE — Telephone Encounter (Signed)
Received call from pt's wife on Friday that pt has run out of samples for Cymbalta and needs to reorder from [facility]. Advised her I would leave samples at the front desk to hold him over until refill is mailed to him. Completed fax refill request and forwarded to Provider for signature.

## 2011-07-24 NOTE — Telephone Encounter (Signed)
Signed.

## 2011-07-24 NOTE — Telephone Encounter (Signed)
Refill request form faxed to [phone].

## 2011-07-25 ENCOUNTER — Ambulatory Visit: Payer: Self-pay | Admitting: Family

## 2011-07-25 ENCOUNTER — Encounter: Payer: Self-pay | Admitting: Family

## 2011-07-25 VITALS — BP 134/80 | HR 69 | Temp 98.0°F | Resp 16 | Ht 68.0 in | Wt 256.1 lb

## 2011-07-25 DIAGNOSIS — M542 Cervicalgia: Secondary | ICD-10-CM

## 2011-07-25 DIAGNOSIS — T148XXA Other injury of unspecified body region, initial encounter: Secondary | ICD-10-CM

## 2011-07-25 DIAGNOSIS — M549 Dorsalgia, unspecified: Secondary | ICD-10-CM

## 2011-07-25 MED ORDER — MELOXICAM 7.5 MG PO TABS: 7.5000 mg | ORAL_TABLET | Freq: Every day | ORAL | Status: DC

## 2011-07-25 MED ORDER — SULFAMETHOXAZOLE-TMP DS 800-160 MG PO TABS: 1.0000 | ORAL_TABLET | Freq: Two times a day (BID) | ORAL | Status: AC
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
## 2011-07-25 NOTE — Patient Instructions (Signed)
Call if symptoms worsen, or if no improvement in 2-3 weeks.

## 2011-07-31 DIAGNOSIS — T148XXA Other injury of unspecified body region, initial encounter: Secondary | ICD-10-CM | POA: Insufficient documentation

## 2011-07-31 DIAGNOSIS — S134XXA Sprain of ligaments of cervical spine, initial encounter: Secondary | ICD-10-CM | POA: Insufficient documentation

## 2011-07-31 DIAGNOSIS — M549 Dorsalgia, unspecified: Secondary | ICD-10-CM | POA: Insufficient documentation
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
## 2011-07-31 NOTE — Assessment & Plan Note (Signed)
Will rx empirically for possible early cellulitis with bactrim.
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
## 2011-08-07 NOTE — Telephone Encounter (Signed)
Received Cymbalta 60mg  #4 bottles from [employer] pt assistance. Left detailed message on pt's home # that samples are at the front desk for pick up.

## 2011-12-14 ENCOUNTER — Telehealth: Payer: Self-pay | Admitting: Family

## 2011-12-14 NOTE — Telephone Encounter (Signed)
The patient is out and needs cymbalta 60 mg samples and his meds from the manufacturer ordered asap

## 2011-12-15 MED ORDER — DULOXETINE HCL 60 MG PO CPEP: 60.0000 mg | ORAL_CAPSULE | Freq: Every day | ORAL | Status: DC

## 2011-12-15 NOTE — Telephone Encounter (Signed)
Signed.

## 2011-12-15 NOTE — Telephone Encounter (Signed)
Refill form completed and forwarded to Provider for signature. We do not have samples on hand and pt is out of medication.  Please advise.

## 2011-12-15 NOTE — Telephone Encounter (Signed)
Spoke with pt, he states he has been out of medication x 2 weeks. Has been having nightmares. Advised pt we are out of samples but I could send a 2 week supply to his pharmacy to purchase out of pocket until pt assistance samples arrive. Form faxed to [facility] [phone]. Pt states he is unsure if he wants to continue Cymbalta. Advised pt per verbal from Provider that he will need f/u with [name] to discuss before he restarts medication if he firmly wants to stop medication. Pt states he will talk with his wife first and will let us know on Monday if he wishes to d/c med. Pt requested 2 week supply be sent to pharmacy to have in case he wants to restart it over the weekend. Refill sent. Awaiting samples from [facility].

## 2011-12-15 NOTE — Telephone Encounter (Signed)
Rx did not go through electronically. Rep came be office this p.m. With samples. Notified pt and he will pick up 2 boxes from our office. Awaiting samples from [facility].

## 2011-12-20 ENCOUNTER — Encounter: Payer: Self-pay | Admitting: Family

## 2011-12-20 ENCOUNTER — Ambulatory Visit (INDEPENDENT_AMBULATORY_CARE_PROVIDER_SITE_OTHER): Payer: Self-pay | Admitting: Family

## 2011-12-20 VITALS — BP 130/86 | HR 86 | Temp 97.8°F | Resp 18 | Ht 68.0 in | Wt 268.0 lb

## 2011-12-20 DIAGNOSIS — R635 Abnormal weight gain: Secondary | ICD-10-CM

## 2011-12-20 DIAGNOSIS — F329 Major depressive disorder, single episode, unspecified: Secondary | ICD-10-CM

## 2011-12-20 NOTE — Patient Instructions (Signed)
Please complete your blood work prior to leaving.  Follow up in 6 months, sooner if problems or concerns.  

## 2011-12-20 NOTE — Progress Notes (Signed)
  Subjective:    Patient ID: Devin [name], male    DOB: January 12, 1971, 41 y.o.   MRN: [id number]  HPI [name] is a 41 yr old male who presents today for follow up.   Depression- Reports + weight gain.  Reports that he feels more motivated to exercise since he stopped cymbalta. Feels motivated to lose his weight.  Reports that he loves to run.  Reports overall mood is good.  Elevated blood pressure- Watches sodium intake.   Review of Systems Se HPI    Objective:   Physical Exam  Constitutional: He is oriented to person, place, and time.       Overweight white male. NAD.  Musculoskeletal: He exhibits no edema.  Neurological: He is alert and oriented to person, place, and time.  Psychiatric: He has a normal mood and affect. His behavior is normal. Judgment and thought content normal.          Assessment & Plan:

## 2011-12-21 ENCOUNTER — Encounter: Payer: Self-pay | Admitting: Family

## 2011-12-21 DIAGNOSIS — R635 Abnormal weight gain: Secondary | ICD-10-CM | POA: Insufficient documentation

## 2011-12-21 NOTE — Assessment & Plan Note (Signed)
TSH is normal. Pt has not been exercising.  Encouraged healthy diet and exercise.

## 2011-12-21 NOTE — Assessment & Plan Note (Signed)
Seems stable off of cymbalta. Continue off.  Monitor.

## 2012-01-12 NOTE — Telephone Encounter (Signed)
Samples were received from pt assistance. Pt noted at 12/20/11 office visit that he no longer wanted to take Cymbalta. Company will not take medication back. Meds discarded.

## 2012-02-22 ENCOUNTER — Ambulatory Visit: Payer: Self-pay | Admitting: Internal Medicine

## 2012-06-17 ENCOUNTER — Ambulatory Visit: Payer: Self-pay | Admitting: Family

## 2013-07-11 ENCOUNTER — Ambulatory Visit (INDEPENDENT_AMBULATORY_CARE_PROVIDER_SITE_OTHER): Payer: No Typology Code available for payment source | Admitting: Family

## 2013-07-11 ENCOUNTER — Encounter: Payer: Self-pay | Admitting: Family

## 2013-07-11 VITALS — BP 130/74 | HR 73 | Temp 98.3°F | Resp 16 | Ht 68.0 in | Wt 256.1 lb

## 2013-07-11 DIAGNOSIS — F329 Major depressive disorder, single episode, unspecified: Secondary | ICD-10-CM

## 2013-07-11 DIAGNOSIS — I10 Essential (primary) hypertension: Secondary | ICD-10-CM

## 2013-07-11 DIAGNOSIS — F3289 Other specified depressive episodes: Secondary | ICD-10-CM

## 2013-07-11 MED ORDER — CITALOPRAM HYDROBROMIDE 20 MG PO TABS: ORAL_TABLET | ORAL | Status: DC

## 2013-07-11 NOTE — Assessment & Plan Note (Signed)
BP stable off of med.

## 2013-07-11 NOTE — Progress Notes (Signed)
Subjective:    Patient ID: Devin [name], male    DOB: 09/24/1970, 43 y.o.   MRN: [id number]  HPI  Mr. Marson is a 43 yr old male who presents today with chief complaint of difficulty staying asleep. Only eating 1 meal a day.  Reports that he has been busy working.  Reports + anxiety.  Anxiety has been bothering him for since the winter time.  Reports that the last 2-3 months he has had depression. Doesn't look forward to things.  Wife reports that he believes she is having an affair which claims is not true. She reports that he remains fixated on this.  She even resorted to a lie detector test to prove to him that this is not true. When asked if he has HI/SI, wife told me that he told her a few weeks back that he was going to "face time me and blow his head off."  He denies current SI/HI.  Wife recently returned to work and he is no longer able to take to her during the day which is difficult for him.   Wt Readings from Last 3 Encounters:  07/11/13 256 lb 1.3 oz (116.157 kg)  12/20/11 268 lb (121.564 kg)  07/25/11 256 lb 1.3 oz (116.157 kg)       Review of Systems See HPI  Past Medical History  Diagnosis Date  . Hypertension     controlled  . History of MRSA infection   . Keratoconus of both eyes     History   Social History  . Marital Status: Married    Spouse Name: N/A    Number of Children: 3  . Years of Education: N/A   Occupational History  . [name]    Social History Main Topics  . Smoking status: Never Smoker   . Smokeless tobacco: Never Used  . Alcohol Use: No  . Drug Use: Not on file  . Sexual Activity: Not on file   Other Topics Concern  . Not on file   Social History Narrative   Regular exercise: yes (runs and uses elliptical machine)   Quit school in the 8th grade.    No past surgical history on file.  Family History  Problem Relation Age of Onset  . Diabetes Mother   . Hyperlipidemia Mother   . Heart disease Mother     CHF  .  Thyroid disease Mother     Graves Disease  . Hypertension Father   . Asthma Daughter   . ADD / ADHD Daughter     Allergies  Allergen Reactions  . Penicillins     REACTION: causes rash and sob.    No current outpatient prescriptions on file prior to visit.   No current facility-administered medications on file prior to visit.    BP 130/74  Pulse 73  Temp(Src) 98.3 F (36.8 C) (Oral)  Resp 16  Ht 5\' 8"  (1.727 m)  Wt 256 lb 1.3 oz (116.157 kg)  BMI 38.95 kg/m2  SpO2 99%       Objective:   Physical Exam  Constitutional: He is oriented to person, place, and time. He appears well-developed and well-nourished. No distress.  HENT:  Head: Normocephalic and atraumatic.  Musculoskeletal: He exhibits no edema.  Lymphadenopathy:    He has no cervical adenopathy.  Neurological: He is alert and oriented to person, place, and time.  Psychiatric: His behavior is normal. Judgment normal.  Flat affect, tearful  Assessment & Plan:

## 2013-07-11 NOTE — Patient Instructions (Addendum)
Start citalopram 20mg , 1/2 tab once daily for 1 week, then increase to a full tablet once daily on week two. Please schedule a follow up appointment in 1 month.

## 2013-07-11 NOTE — Progress Notes (Signed)
Pre visit review using our clinic review tool, if applicable. No additional management support is needed unless otherwise documented below in the visit note. 

## 2013-07-11 NOTE — Assessment & Plan Note (Addendum)
Deteriorated.  I think that this is cause for insomnia issues.  He has tolerated citalopram in the past. Will initiate citalopram 20mg .  I instructed pt to start 1/2 tablet once daily for 1 week and then increase to a full tablet once daily on week two as tolerated.  We discussed common side effects such as nausea, drowsiness and weight gain.  Also discussed rare but serious side effect of suicide ideation.  He is instructed to discontinue medication go directly to ED if this occurs.  Pt verbalizes understanding.  Plan follow up in 1 month to evaluate progress.    20 minutes spent with pt today.  >50% of this time was spent counseling pt on anxiety and depression.

## 2013-07-15 ENCOUNTER — Telehealth: Payer: Self-pay | Admitting: Family

## 2013-07-15 ENCOUNTER — Telehealth: Payer: Self-pay | Admitting: *Deleted

## 2013-07-15 MED ORDER — ALPRAZOLAM 0.25 MG PO TABS: 0.2500 mg | ORAL_TABLET | Freq: Two times a day (BID) | ORAL | Status: DC | PRN

## 2013-07-15 NOTE — Telephone Encounter (Signed)
Relevant patient education assigned to patient using Emmi. ° °

## 2013-07-15 NOTE — Telephone Encounter (Signed)
Received message from pt's wife that she thinks pt has been having side effects since stating Celexa and she wants [name] to call pt. Spoke with pt, he reports paranoia and panic sensations since starting Celexa. Has noticed that he will be trembling. Wants to know if this could be side effect of Celexa? Was not having these symptoms prior to starting Celexa.  Per verbal from Provider, ok to try xanax 0.25mg  three times daily as needed for anxiety, #20 x no refills. Do not drive or operate machinery when taking xanax. Notified pt. He voices understanding. States he doesn't want to take anything long term that could possibly cause addiction. Advised pt this only intended as short term use until he is adjusted to celexa. He was advised to call us if symptoms do not improve and Provider will consider changing medication. Made pt aware to seek care in ER if he develops suicidal or homicidal thoughts and he voices understanding. Rx called to pharmacy voicemail.

## 2013-08-12 ENCOUNTER — Other Ambulatory Visit: Payer: Self-pay | Admitting: Family

## 2013-08-18 ENCOUNTER — Ambulatory Visit (INDEPENDENT_AMBULATORY_CARE_PROVIDER_SITE_OTHER): Payer: BC Managed Care – PPO | Admitting: Family

## 2013-08-18 ENCOUNTER — Encounter: Payer: Self-pay | Admitting: Family

## 2013-08-18 VITALS — BP 128/80 | HR 61 | Temp 98.1°F | Resp 16 | Ht 68.0 in | Wt 261.1 lb

## 2013-08-18 DIAGNOSIS — F341 Dysthymic disorder: Secondary | ICD-10-CM

## 2013-08-18 DIAGNOSIS — F418 Other specified anxiety disorders: Secondary | ICD-10-CM

## 2013-08-18 MED ORDER — CITALOPRAM HYDROBROMIDE 20 MG PO TABS: ORAL_TABLET | ORAL | Status: DC

## 2013-08-18 NOTE — Assessment & Plan Note (Signed)
Improving on citalopram. Continue same. We discussed adding in some exercise as he really enjoys this and he has gained some weight recently.  I have asked him to monitor his weight at home and call if further weight gain.  15 min spent with pt today.  >50% of this time was spent counseling pt on anxiety, depression, exercise.

## 2013-08-18 NOTE — Patient Instructions (Signed)
Continue citalopram.   Please follow up in 2 months.

## 2013-08-18 NOTE — Progress Notes (Signed)
Pre visit review using our clinic review tool, if applicable. No additional management support is needed unless otherwise documented below in the visit note.,  

## 2013-08-18 NOTE — Progress Notes (Signed)
   Subjective:    Patient ID: Devin [name], male    DOB: [birth year], 43 y.o.   MRN: [id number]  HPI  Devin [name] is a 43 yr old male who presents today for follow up of depression. Last visit citalopram was added to his regimen. Reports that anxiety is better.  Sleeping much better.  Feels like he is sleeping too much- has been going to bed earlier and sleeping through the night.  Denies SI/HI. Wants to get back into running/working out.   Wt Readings from Last 3 Encounters:  08/18/13 261 lb 1.9 oz (118.443 kg)  07/11/13 256 lb 1.3 oz (116.157 kg)  12/20/11 268 lb (121.564 kg)      Review of Systems See HPI  Past Medical History  Diagnosis Date  . Hypertension     controlled  . History of MRSA infection   . Keratoconus of both eyes     History   Social History  . Marital Status: Married    Spouse Name: N/A    Number of Children: 3  . 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 Smoking status: Never Smoker   . Smokeless tobacco: Never Used  . Alcohol Use: No  . Drug Use: Not on file  . Sexual Activity: Not on file   Other Topics Concern  . Not on file   Social History Narrative   Regular exercise: yes (runs and uses elliptical machine)   Quit school in the 8th grade.    No past surgical history on file.  Family History  Problem Relation Age of Onset  . Diabetes Mother   . Hyperlipidemia Mother   . Heart disease Mother     CHF  . Thyroid disease Mother     Graves Disease  . Hypertension Father   . Asthma Daughter   . ADD / ADHD Daughter     Allergies  Allergen Reactions  . Penicillins     REACTION: causes rash and sob.    Current Outpatient Prescriptions on File Prior to Visit  Medication Sig Dispense Refill  . ALPRAZolam (XANAX) 0.25 MG tablet Take 1 tablet (0.25 mg total) by mouth 2 (two) times daily as needed for anxiety.  20 tablet  0   No current facility-administered medications on  file prior to visit.    BP 128/80  Pulse 61  Temp(Src) 98.1 F (36.7 C) (Oral)  Resp 16  Ht 5\' 8"  (1.727 m)  Wt 261 lb 1.9 oz (118.443 kg)  BMI 39.71 kg/m2  SpO2 98%       Objective:   Physical Exam  Constitutional: He appears well-developed and well-nourished. No distress.  Psychiatric: He has a normal mood and affect. His behavior is normal. Judgment and thought content normal.          Assessment & Plan:

## 2013-09-01 ENCOUNTER — Encounter: Payer: Self-pay | Admitting: Family

## 2013-09-01 ENCOUNTER — Ambulatory Visit (INDEPENDENT_AMBULATORY_CARE_PROVIDER_SITE_OTHER): Payer: BC Managed Care – PPO | Admitting: Family

## 2013-09-01 VITALS — BP 130/82 | HR 71 | Temp 98.3°F | Resp 16 | Ht 68.0 in | Wt 265.1 lb

## 2013-09-01 DIAGNOSIS — F341 Dysthymic disorder: Secondary | ICD-10-CM

## 2013-09-01 DIAGNOSIS — F418 Other specified anxiety disorders: Secondary | ICD-10-CM

## 2013-09-01 DIAGNOSIS — M542 Cervicalgia: Secondary | ICD-10-CM

## 2013-09-01 MED ORDER — METHYLPREDNISOLONE 4 MG PO KIT: PACK | ORAL | Status: DC

## 2013-09-01 MED ORDER — VENLAFAXINE HCL 37.5 MG PO TABS: ORAL_TABLET | ORAL | Status: DC

## 2013-09-01 NOTE — Patient Instructions (Signed)
Start medrol dose pak for neck pain. Decrease citalopram to 10mg  (1/2 tab) once daily. Start effexor. Call if depression or neck pain worsen or if symptoms do not improve. Follow up in 1 month.

## 2013-09-01 NOTE — Progress Notes (Signed)
   Subjective:    Patient ID: Devin [name], male    DOB: 09/04/1970, 43 y.o.   MRN: [id number]  HPI  Devin [name] is a 43 yr old male who presents today with two concerns:  1) Neck pain/stiffness- has been present x 3 days.  Has been using ibuprofen without improvement.  Pain radiates down the left side of the neck into the left shoulder. Trouble sleeping due to pain.   2) Depression-  Reports that he has days when he does not want to get out of bed.  Feeling very sleep on citalopram and has gained 4 pounds since his last visit.    Review of Systems See HPI  Past Medical History  Diagnosis Date  . Hypertension     controlled  . History of MRSA infection   . Keratoconus of both eyes     History   Social History  . Marital Status: Married    Spouse Name: N/A    Number of Children: 3  . 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 Smoking status: Never Smoker   . Smokeless tobacco: Never Used  . Alcohol Use: No  . Drug Use: Not on file  . Sexual Activity: Not on file   Other Topics Concern  . Not on file   Social History Narrative   Regular exercise: yes (runs and uses elliptical machine)   Quit school in the 8th grade.    No past surgical history on file.  Family History  Problem Relation Age of Onset  . Diabetes Mother   . Hyperlipidemia Mother   . Heart disease Mother     CHF  . Thyroid disease Mother     Graves Disease  . Hypertension Father   . Asthma Daughter   . ADD / ADHD Daughter     Allergies  Allergen Reactions  . Penicillins     REACTION: causes rash and sob.    Current Outpatient Prescriptions on File Prior to Visit  Medication Sig Dispense Refill  . ALPRAZolam (XANAX) 0.25 MG tablet Take 1 tablet (0.25 mg total) by mouth 2 (two) times daily as needed for anxiety.  20 tablet  0   No current facility-administered medications on file prior to visit.    BP 130/82  Pulse 71   Temp(Src) 98.3 F (36.8 C) (Oral)  Resp 16  Ht 5\' 8"  (1.727 m)  Wt 265 lb 1.3 oz (120.239 kg)  BMI 40.31 kg/m2  SpO2 99%       Objective:   Physical Exam  Constitutional: He appears well-developed and well-nourished. No distress.  Musculoskeletal:       Cervical back: He exhibits tenderness.  Bilateral hand grasps/UE strength is 5/5.   Neurological:  Reflex Scores:      Bicep reflexes are 2+ on the right side and 3+ on the left side. Psychiatric: He has a normal mood and affect. His speech is normal and behavior is normal.  Slightly flat affect          Assessment & Plan:

## 2013-09-01 NOTE — Progress Notes (Signed)
Pre visit review using our clinic review tool, if applicable. No additional management support is needed unless otherwise documented below in the visit note. 

## 2013-09-01 NOTE — Assessment & Plan Note (Signed)
Remains uncontrolled. Will decrease citalopram, add effexor with close monitoring of BP.  If continued weight gain and sleepiness persist despite decreased dose of citalopram, consider discontinuation next visit.

## 2013-09-01 NOTE — Assessment & Plan Note (Signed)
Suspect cervical DD. Advised pt to start medrol dose pak. If symptoms worsen or if symptoms do not improve, consider MRI C spine for further evaluation.

## 2013-10-06 ENCOUNTER — Other Ambulatory Visit: Payer: Self-pay | Admitting: Family

## 2013-10-20 ENCOUNTER — Ambulatory Visit: Payer: BC Managed Care – PPO | Admitting: Family

## 2013-10-28 ENCOUNTER — Telehealth: Payer: Self-pay | Admitting: Family

## 2013-10-28 MED ORDER — CYCLOBENZAPRINE HCL 5 MG PO TABS: 5.0000 mg | ORAL_TABLET | Freq: Every day | ORAL | Status: DC

## 2013-10-28 NOTE — Telephone Encounter (Signed)
Notified pt's spouse and she voices understanding. 

## 2013-10-28 NOTE — Telephone Encounter (Signed)
Caller name: Nadim  Relation to pt: self  Call back number: [phone]   Reason for call:   pt was seen in [facility] 10/27/13 for pain in left shoulder pt is currently on Prednisone. Pt is still in pain in need of clinical advice.

## 2013-10-28 NOTE — Telephone Encounter (Signed)
Spoke with pt's wife. Pt woke up Sunday with shoulder pain and went to urgent care. States previous episode of left side neck and shoulder pain resolved after treatment in July. She states pt has been prescribed a prednisone dose pack and low dose of Norco. States pt is still in considerable pain and has decreased range of motion due to his pain. Scheduled pt appt for tomorrow at 1:15pm. Is there anything else you recommend?

## 2013-10-28 NOTE — Telephone Encounter (Signed)
I sent rx for flexeril that he can take at bedtime as needed.  Do not take during day due to risk of drowsiness.

## 2013-10-29 ENCOUNTER — Encounter: Payer: Self-pay | Admitting: Family

## 2013-10-29 ENCOUNTER — Ambulatory Visit (INDEPENDENT_AMBULATORY_CARE_PROVIDER_SITE_OTHER): Payer: BC Managed Care – PPO | Admitting: Family

## 2013-10-29 VITALS — BP 150/80 | HR 99 | Temp 97.6°F | Resp 16 | Ht 68.0 in | Wt 272.2 lb

## 2013-10-29 DIAGNOSIS — R259 Unspecified abnormal involuntary movements: Secondary | ICD-10-CM

## 2013-10-29 DIAGNOSIS — F418 Other specified anxiety disorders: Secondary | ICD-10-CM

## 2013-10-29 DIAGNOSIS — G252 Other specified forms of tremor: Secondary | ICD-10-CM | POA: Insufficient documentation

## 2013-10-29 DIAGNOSIS — I10 Essential (primary) hypertension: Secondary | ICD-10-CM

## 2013-10-29 DIAGNOSIS — R635 Abnormal weight gain: Secondary | ICD-10-CM

## 2013-10-29 DIAGNOSIS — M542 Cervicalgia: Secondary | ICD-10-CM

## 2013-10-29 DIAGNOSIS — F341 Dysthymic disorder: Secondary | ICD-10-CM

## 2013-10-29 DIAGNOSIS — Z23 Encounter for immunization: Secondary | ICD-10-CM

## 2013-10-29 MED ORDER — PROPRANOLOL HCL 40 MG PO TABS
40.0000 mg | ORAL_TABLET | Freq: Two times a day (BID) | ORAL | Status: DC
Start: 2013-10-29 — End: 2013-11-24

## 2013-10-29 NOTE — Assessment & Plan Note (Signed)
BP up slightly today. Propranolol will hopefully help both tremor and BP.

## 2013-10-29 NOTE — Progress Notes (Signed)
Subjective:    Patient ID: Devin [name], male    DOB: Jun 11, 1970, 43 y.o.   MRN: [id number]  HPI  [name] presents today with several concerns:  1) Left neck and shoulder pain-  Reports that he went to the urgent care on Sunday and was given rx for prednisone.  Also taking ibuprofen. Symptoms started Saturday. Pain starts in the left base of neck and radiates down the left arm.  Reports increased pain when he tries to lift the left arm.  Notes only slight improvement in symptoms since he started prednisone. He denies numbness in the left arm.   2) Weight gain-  Our records show that his weight in may was 256. He has gained 16 pounds since that time. Drinks 18 beers on a Friday or Saturday.   Wt Readings from Last 3 Encounters:  10/29/13 272 lb 3.2 oz (123.469 kg)  09/01/13 265 lb 1.3 oz (120.239 kg)  08/18/13 261 lb 1.9 oz (118.443 kg)   3) Tremors-  Reports + hx of familial tremor. He has had a tremor his "whole life."  Interfering with his ability to wear contacts because he cannot place contacts due to tremor.   4) Depression/anxiety-  He is currently on citalopram and effexor.  Reports that his symptoms are well controlled.   BP Readings from Last 3 Encounters:  10/29/13 150/80  09/01/13 130/82  08/18/13 128/80    Review of Systems    see HPI  Past Medical History  Diagnosis Date  . Hypertension     controlled  . History of MRSA infection   . Keratoconus of both eyes     History   Social History  . Marital Status: Married    Spouse Name: N/A    Number of Children: 3  . Years of Education: N/A   Occupational History  . [name]    Social History Main Topics  . Smoking status: Never Smoker   . Smokeless tobacco: Never Used  . Alcohol Use: No  . Drug Use: Not on file  . Sexual Activity: Not on file   Other Topics Concern  . Not on file   Social History Narrative   Regular exercise: yes (runs and uses elliptical machine)   Quit school in the  8th grade.    No past surgical history on file.  Family History  Problem Relation Age of Onset  . Diabetes Mother   . Hyperlipidemia Mother   . Heart disease Mother     CHF  . Thyroid disease Mother     Graves Disease  . Hypertension Father   . Asthma Daughter   . ADD / ADHD Daughter     Allergies  Allergen Reactions  . Penicillins     REACTION: causes rash and sob.    Current Outpatient Prescriptions on File Prior to Visit  Medication Sig Dispense Refill  . ALPRAZolam (XANAX) 0.25 MG tablet Take 1 tablet (0.25 mg total) by mouth 2 (two) times daily as needed for anxiety.  20 tablet  0  . citalopram (CELEXA) 20 MG tablet 10 mg. Take one tab by mouth daily      . cyclobenzaprine (FLEXERIL) 5 MG tablet Take 1 tablet (5 mg total) by mouth at bedtime.  15 tablet  0  . venlafaxine (EFFEXOR) 37.5 MG tablet TAKE ONE TABLET BY MOUTH ONCE DAILY FOR 3 DAYS AND THEN ONE TABLET TWICE DAILY  60 tablet  0   No current facility-administered medications on  file prior to visit.    BP 150/80  Pulse 99  Temp(Src) 97.6 F (36.4 C) (Oral)  Resp 16  Ht  (1.727 m)  Wt 272 lb 3.2 oz (123.469 kg)  BMI 41.40 kg/m2  SpO2 99%    Objective:   Physical Exam  Constitutional: He appears well-developed. No distress.  HENT:  Head: Normocephalic and atraumatic.  Musculoskeletal: He exhibits no edema.  + tenderness to palpation at base of left neck and down the left shoulder.    Lymphadenopathy:    He has no cervical adenopathy.  Neurological: He is alert.  Fine resting hand tremor is noted.   Psychiatric: He has a normal mood and affect. His behavior is normal. Judgment and thought content normal.          Assessment & Plan:

## 2013-10-29 NOTE — Assessment & Plan Note (Signed)
Recurrent. Continue prednisone.  Advised pt to call if symptoms not improved in 2 days, at which time will consider MRI of the C spine.

## 2013-10-29 NOTE — Assessment & Plan Note (Signed)
I am concerned that SSRI is contributing to weight gain. He is motivated to start weight watchers and increase his exercise.  I have asked him to weigh himself at home and notify me if he has continued weight gain.

## 2013-10-29 NOTE — Patient Instructions (Addendum)
Start Propranolol for tremor. Continue prednisone.   Call me on Friday if your symptoms are not improved. Work hard on diet (start [employer] in addition to your exercise). Follow up in 1-2 weeks for BP recheck.

## 2013-10-29 NOTE — Progress Notes (Signed)
Pre visit review using our clinic review tool, if applicable. No additional management support is needed unless otherwise documented below in the visit note. 

## 2013-10-29 NOTE — Assessment & Plan Note (Signed)
Trial of propranolol.    

## 2013-10-29 NOTE — Assessment & Plan Note (Signed)
Currently stable. I am hesitant to change his meds unless absolutely necessary (see weight gain below).

## 2013-11-03 ENCOUNTER — Ambulatory Visit: Payer: BC Managed Care – PPO | Admitting: Family

## 2013-11-06 ENCOUNTER — Other Ambulatory Visit: Payer: Self-pay | Admitting: Family Medicine

## 2013-11-11 ENCOUNTER — Ambulatory Visit: Payer: BC Managed Care – PPO | Admitting: Family

## 2013-11-11 ENCOUNTER — Encounter: Payer: Self-pay | Admitting: *Deleted

## 2013-11-24 ENCOUNTER — Ambulatory Visit (INDEPENDENT_AMBULATORY_CARE_PROVIDER_SITE_OTHER): Payer: BC Managed Care – PPO | Admitting: Family

## 2013-11-24 ENCOUNTER — Encounter: Payer: Self-pay | Admitting: Family

## 2013-11-24 VITALS — BP 128/68 | HR 51 | Temp 98.2°F | Resp 16 | Ht 68.0 in | Wt 274.4 lb

## 2013-11-24 DIAGNOSIS — R258 Other abnormal involuntary movements: Secondary | ICD-10-CM

## 2013-11-24 DIAGNOSIS — M542 Cervicalgia: Secondary | ICD-10-CM

## 2013-11-24 DIAGNOSIS — I10 Essential (primary) hypertension: Secondary | ICD-10-CM

## 2013-11-24 DIAGNOSIS — G252 Other specified forms of tremor: Secondary | ICD-10-CM

## 2013-11-24 DIAGNOSIS — R259 Unspecified abnormal involuntary movements: Secondary | ICD-10-CM

## 2013-11-24 MED ORDER — MELOXICAM 7.5 MG PO TABS: 7.5000 mg | ORAL_TABLET | Freq: Every day | ORAL | Status: DC

## 2013-11-24 MED ORDER — PROPRANOLOL HCL 40 MG PO TABS: 40.0000 mg | ORAL_TABLET | Freq: Two times a day (BID) | ORAL | Status: DC

## 2013-11-24 NOTE — Assessment & Plan Note (Addendum)
BP is improved on propranolol.  Mild bradycardia noted today.  Pt is asymptomatic. Continue same. Monitor heart rate.

## 2013-11-24 NOTE — Progress Notes (Signed)
Pre visit review using our clinic review tool, if applicable. No additional management support is needed unless otherwise documented below in the visit note. 

## 2013-11-24 NOTE — Patient Instructions (Addendum)
You will be contacted about your MRI. You may start meloxicam once daily as needed for pain. Stop ibuprofen. Follow up in 3 months, sooner if problems/concerns.

## 2013-11-24 NOTE — Assessment & Plan Note (Signed)
Will refer for mri of the c-spine for further evaluation.

## 2013-11-24 NOTE — Progress Notes (Signed)
Subjective:    Patient ID: Devin [name], male    DOB: [birth year], 43 y.o.   MRN: [id number]  HPI  Mr. Devin [name] is a 43 yr old male who presents today for follow up. He just returned from a cruise.  1) Tremor- was given trial of propranolol. No improvement in his tremor. Declines neuro referral to neuro.    2) HTN- BP is improved on propranolol.  BP Readings from Last 3 Encounters:  11/24/13 128/68  10/29/13 150/80  09/01/13 130/82   3) Shoulder/neck pain- persists. He would like to proceed with MRI of the C-Spine. Slight improvement with a tens unit. Pain radiates from neck down the left shoulder. Denies numbness or weakness of the LUE.       Review of Systems    see HPI  Past Medical History  Diagnosis Date  . Hypertension     controlled  . History of MRSA infection   . Keratoconus of both eyes     History   Social History  . Marital Status: Married    Spouse Name: N/A    Number of Children: 3  . Years of Education: N/A   Occupational History  . Devin [name]    Social History Main Topics  . Smoking status: Never Smoker   . Smokeless tobacco: Never Used  . Alcohol Use: No  . Drug Use: Not on file  . Sexual Activity: Not on file   Other Topics Concern  . Not on file   Social History Narrative   Regular exercise: yes (runs and uses elliptical machine)   Quit school in the 8th grade.    No past surgical history on file.  Family History  Problem Relation Age of Onset  . Diabetes Mother   . Hyperlipidemia Mother   . Heart disease Mother     CHF  . Thyroid disease Mother     Graves Disease  . Hypertension Father   . Asthma Daughter   . ADD / ADHD Daughter     Allergies  Allergen Reactions  . Penicillins     REACTION: causes rash and sob.    Current Outpatient Prescriptions on File Prior to Visit  Medication Sig Dispense Refill  . ALPRAZolam (XANAX) 0.25 MG tablet Take 1 tablet (0.25 mg total) by mouth 2 (two) times daily as needed for  anxiety.  20 tablet  0  . citalopram (CELEXA) 20 MG tablet 10 mg. Take one tab by mouth daily      . cyclobenzaprine (FLEXERIL) 5 MG tablet Take 1 tablet (5 mg total) by mouth at bedtime.  15 tablet  0  . predniSONE (DELTASONE) 20 MG tablet Take 20 mg by mouth.      . propranolol (INDERAL) 40 MG tablet Take 1 tablet (40 mg total) by mouth 2 (two) times daily.  60 tablet  1  . venlafaxine (EFFEXOR) 37.5 MG tablet Take 1 tablet (37.5 mg total) by mouth 2 (two) times daily.  60 tablet  0   No current facility-administered medications on file prior to visit.    BP 128/68  Pulse 51  Temp(Src) 98.2 F (36.8 C) (Oral)  Resp 16  Ht 5\' 8"  (1.727 m)  Wt 274 lb 6.4 oz (124.467 kg)  BMI 41.73 kg/m2  SpO2 100%    Objective:   Physical Exam  Constitutional: He is oriented to person, place, and time. He appears well-developed and well-nourished. No distress.  Musculoskeletal:  No shoulder tenderness to palpation. + tenderness  to palpation at the base of the neck on left  Neurological: He is alert and oriented to person, place, and time.  Psychiatric: He has a normal mood and affect. His behavior is normal. Judgment and thought content normal.          Assessment & Plan:

## 2013-11-24 NOTE — Assessment & Plan Note (Signed)
Unchanged. We discussed referral to neurology but he declines at this time.

## 2013-11-29 ENCOUNTER — Ambulatory Visit (HOSPITAL_BASED_OUTPATIENT_CLINIC_OR_DEPARTMENT_OTHER)
Admission: RE | Admit: 2013-11-29 | Discharge: 2013-11-29 | Disposition: A | Discharge status: Home / Self Care | Payer: BC Managed Care – PPO | Source: Ambulatory Visit | Attending: Family | Admitting: Family

## 2013-11-29 DIAGNOSIS — M25512 Pain in left shoulder: Secondary | ICD-10-CM | POA: Diagnosis not present

## 2013-11-29 DIAGNOSIS — M542 Cervicalgia: Secondary | ICD-10-CM

## 2013-11-30 ENCOUNTER — Telehealth: Payer: Self-pay | Admitting: Family

## 2013-11-30 DIAGNOSIS — M5090 Cervical disc disorder, unspecified, unspecified cervical region: Secondary | ICD-10-CM

## 2013-11-30 NOTE — Telephone Encounter (Signed)
Please contact pt and let him know that MRI shows bulging disc and degenerative disc changes in his cervical spine. This explains the pain he has been having radiating into his left shoulder.  I would like for him to be evaluated by Neurosurgery.

## 2013-12-01 MED ORDER — MELOXICAM 7.5 MG PO TABS
7.5000 mg | ORAL_TABLET | Freq: Every day | ORAL | Status: DC
Start: 2013-12-01 — End: 2013-12-23

## 2013-12-01 NOTE — Telephone Encounter (Signed)
Notified pt and he voices understanding and is agreeable to proceed with referral. Pt has 1 week left of Meloxicam and wants to know if he will be able to get another refills of that or what should he do for his pain until he gets in with neurosurgery?

## 2013-12-01 NOTE — Telephone Encounter (Signed)
Refill sent.

## 2013-12-02 NOTE — Telephone Encounter (Signed)
Notified pt. 

## 2013-12-08 ENCOUNTER — Telehealth: Payer: Self-pay | Admitting: Family

## 2013-12-08 MED ORDER — TRAMADOL HCL 50 MG PO TABS: 50.0000 mg | ORAL_TABLET | Freq: Three times a day (TID) | ORAL | Status: DC | PRN

## 2013-12-08 NOTE — Telephone Encounter (Signed)
See rx for tramadol

## 2013-12-08 NOTE — Telephone Encounter (Signed)
Rx faxed to pharmacy. Marj, pt states pain is getting worse. Can you call neuro and see if they will expedite appt?

## 2013-12-08 NOTE — Telephone Encounter (Signed)
Shoulder pain is worse.  [facility] has not set appt yet.  [facility]

## 2013-12-18 ENCOUNTER — Encounter: Payer: Self-pay | Admitting: Family

## 2013-12-18 ENCOUNTER — Ambulatory Visit (INDEPENDENT_AMBULATORY_CARE_PROVIDER_SITE_OTHER): Payer: BC Managed Care – PPO | Admitting: Family

## 2013-12-18 VITALS — BP 118/60 | HR 56 | Temp 98.6°F | Wt 277.8 lb

## 2013-12-18 DIAGNOSIS — J209 Acute bronchitis, unspecified: Secondary | ICD-10-CM | POA: Insufficient documentation

## 2013-12-18 MED ORDER — BENZONATATE 100 MG PO CAPS: 100.0000 mg | ORAL_CAPSULE | Freq: Three times a day (TID) | ORAL | Status: DC | PRN

## 2013-12-18 MED ORDER — DOXYCYCLINE HYCLATE 100 MG PO TABS: 100.0000 mg | ORAL_TABLET | Freq: Two times a day (BID) | ORAL | Status: DC

## 2013-12-18 NOTE — Progress Notes (Signed)
Subjective:    Patient ID: Devin BrackenJamie L [name], male    DOB: [birth year], 43 y.o.   MRN: [id number]  HPI  Mr. Devin [name] is a 43 yr old male who presents today with chief complaint of cough. Cough started last night and is described as deep.  Cough is dry mostly with occasional "nasty stuff coming up."  Reports associated sore throat,  No associated fever.  Using sudafed without improvement.  Has felt sleepy/malaise yesterday.   Review of Systems See HPI  Past Medical History  Diagnosis Date  . Hypertension     controlled  . History of MRSA infection   . Keratoconus of both eyes     History   Social History  . Marital Status: Married    Spouse Name: N/A    Number of Children: 3  . Years of Education: N/A   Occupational History  . 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 Smoking status: Never Smoker   . Smokeless tobacco: Never Used  . Alcohol Use: No  . Drug Use: Not on file  . Sexual Activity: Not on file   Other Topics Concern  . Not on file   Social History Narrative   Regular exercise: yes (runs and uses elliptical machine)   Quit school in the 8th grade.    No past surgical history on file.  Family History  Problem Relation Age of Onset  . Diabetes Mother   . Hyperlipidemia Mother   . Heart disease Mother     CHF  . Thyroid disease Mother     Graves Disease  . Hypertension Father   . Asthma Daughter   . ADD / ADHD Daughter     Allergies  Allergen Reactions  . Penicillins     REACTION: causes rash and sob.    Current Outpatient Prescriptions on File Prior to Visit  Medication Sig Dispense Refill  . citalopram (CELEXA) 20 MG tablet 10 mg. Take one tab by mouth daily      . cyclobenzaprine (FLEXERIL) 5 MG tablet Take 1 tablet (5 mg total) by mouth at bedtime.  15 tablet  0  . meloxicam (MOBIC) 7.5 MG tablet Take 1 tablet (7.5 mg total) by mouth daily.  14 tablet  0  . propranolol (INDERAL) 40 MG tablet Take 1 tablet (40 mg total) by mouth 2 (two)  times daily.  60 tablet  2  . traMADol (ULTRAM) 50 MG tablet Take 1 tablet (50 mg total) by mouth every 8 (eight) hours as needed.  20 tablet  0  . venlafaxine (EFFEXOR) 37.5 MG tablet Take 1 tablet (37.5 mg total) by mouth 2 (two) times daily.  60 tablet  0  . ALPRAZolam (XANAX) 0.25 MG tablet Take 1 tablet (0.25 mg total) by mouth 2 (two) times daily as needed for anxiety.  20 tablet  0   No current facility-administered medications on file prior to visit.    BP 118/60  Pulse 56  Temp(Src) 98.6 F (37 C) (Oral)  Wt 277 lb 12.8 oz (126.009 kg)  SpO2 99%       Objective:   Physical Exam  Constitutional: He is oriented to person, place, and time. He appears well-developed and well-nourished. No distress.  HENT:  Right Ear: Tympanic membrane and ear canal normal.  Left Ear: Tympanic membrane and ear canal normal.  Mouth/Throat: Posterior oropharyngeal erythema present. No posterior oropharyngeal edema.  Cardiovascular: Normal rate and regular rhythm.   No murmur heard.  Pulmonary/Chest: Effort normal. He has rales in the left lower field.  Musculoskeletal: He exhibits no edema.  Lymphadenopathy:    He has no cervical adenopathy.  Neurological: He is alert and oriented to person, place, and time.  Psychiatric: He has a normal mood and affect. His behavior is normal. Judgment and thought content normal.          Assessment & Plan:

## 2013-12-18 NOTE — Progress Notes (Signed)
Pre visit review using our clinic review tool, if applicable. No additional management support is needed unless otherwise documented below in the visit note. 

## 2013-12-18 NOTE — Patient Instructions (Signed)
Start doxycycline and as needed tessalon for cough. Call if symptoms worsen or if not improved in 1 week.

## 2013-12-18 NOTE — Assessment & Plan Note (Signed)
Pt with some crackles at the left base, will rx empirically with doxy for bronchitis/early pneumonia. Add prn tessalon.

## 2013-12-23 ENCOUNTER — Other Ambulatory Visit: Payer: Self-pay | Admitting: Family

## 2013-12-23 NOTE — Telephone Encounter (Signed)
Please advise mobic refill. Tramadol rx printed and forwarded to provider for signature.

## 2013-12-24 NOTE — Telephone Encounter (Signed)
Rx was faxed to pharmacy on 12/23/13.

## 2014-02-03 HISTORY — PX: NECK SURGERY: SHX720

## 2014-02-24 ENCOUNTER — Encounter: Payer: Self-pay | Admitting: Family

## 2014-02-24 ENCOUNTER — Ambulatory Visit (INDEPENDENT_AMBULATORY_CARE_PROVIDER_SITE_OTHER): Payer: BC Managed Care – PPO | Admitting: Family

## 2014-02-24 VITALS — BP 110/78 | HR 57 | Temp 97.7°F | Resp 16 | Ht 68.0 in | Wt 270.0 lb

## 2014-02-24 DIAGNOSIS — F418 Other specified anxiety disorders: Secondary | ICD-10-CM

## 2014-02-24 DIAGNOSIS — M542 Cervicalgia: Secondary | ICD-10-CM | POA: Insufficient documentation

## 2014-02-24 DIAGNOSIS — I10 Essential (primary) hypertension: Secondary | ICD-10-CM

## 2014-02-24 LAB — BASIC METABOLIC PANEL
BUN: 11 mg/dL (ref 6–23)
CHLORIDE: 109 meq/L (ref 96–112)
CO2: 25 meq/L (ref 19–32)
CREATININE: 1 mg/dL (ref 0.4–1.5)
Calcium: 9.1 mg/dL (ref 8.4–10.5)
GFR: 84.54 mL/min (ref >60.00)
Glucose, Bld: 106 mg/dL — ABNORMAL HIGH (ref 70–99)
Potassium: 4.1 mEq/L (ref 3.5–5.1)
Sodium: 141 mEq/L (ref 135–145)

## 2014-02-24 MED ORDER — HYDROCHLOROTHIAZIDE 25 MG PO TABS: 25.0000 mg | ORAL_TABLET | Freq: Every day | ORAL | Status: DC

## 2014-02-24 NOTE — Assessment & Plan Note (Addendum)
BP stable but he reports erectile dysfunction and some fatigue likely related to the beta blocker. DC propranolol and Rx for HCTZ. BMET today and follow up in 2 weeks for visit and BMET.

## 2014-02-24 NOTE — Patient Instructions (Signed)
Please complete lab work prior to leaving.  Stop propranolol start hctz. Follow up in 2 weeks.

## 2014-02-24 NOTE — Progress Notes (Signed)
Subjective:    Patient ID: Devin [name], male    DOB: October 11, 1970, 44 y.o.   MRN: [id number]  HPI Devin [name] is here for follow up today.  1. HYPERTENSION: Has not been taking propranolol for a few weeks. Took yesterday and today. The patient denies the following associated symptoms: chest pain, dyspnea, blurred vision, or lower extremity edema. Reports some fatigue and erectile dysfunction. BP Readings from Last 3 Encounters:  02/24/14 110/78  12/18/13 118/60  11/24/13 128/68   2. Depression: Depression is described as stable. Has not been taking citalopram and venlafaxine for the last 3 weeks due to surgery. Resumed yesterday. Denies nausea. Not taking alprazolam. Denies anxiety.  3. Neck pain: Dr. Jule SerNudleman did cervical fusion surgery 3 weeks ago. Pain in left shoulder is resolved.    Review of Systems  Constitutional: Negative for fever, chills and appetite change.  Gastrointestinal: Positive for constipation. Negative for diarrhea.  Neurological: Positive for tremors.   Past Medical History  Diagnosis Date  . Hypertension     controlled  . History of MRSA infection   . Keratoconus of both eyes     History   Social History  . Marital Status: Married    Spouse Name: N/A    Number of Children: 3  . 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 Smoking status: Never Smoker   . Smokeless tobacco: Never Used  . Alcohol Use: No  . Drug Use: Not on file  . Sexual Activity: Not on file   Other Topics Concern  . Not on file   Social History Narrative   Regular exercise: yes (runs and uses elliptical machine)   Quit school in the 8th grade.    Past Surgical History  Procedure Laterality Date  . Neck surgery  02/03/14    Family History  Problem Relation Age of Onset  . Diabetes Mother   . Hyperlipidemia Mother   . Heart disease Mother     CHF  . Thyroid disease Mother     Graves Disease  .  Hypertension Father   . Asthma Daughter   . ADD / ADHD Daughter     Allergies  Allergen Reactions  . Penicillins     REACTION: causes rash and sob.    Current Outpatient Prescriptions on File Prior to Visit  Medication Sig Dispense Refill  . citalopram (CELEXA) 20 MG tablet 10 mg. Take one tab by mouth daily    . venlafaxine (EFFEXOR) 37.5 MG tablet TAKE ONE TABLET BY MOUTH TWICE DAILY 60 tablet 2   No current facility-administered medications on file prior to visit.    BP 110/78 mmHg  Pulse 57  Temp(Src) 97.7 F (36.5 C) (Oral)  Resp 16  Ht 5\' 8"  (1.727 m)  Wt 270 lb (122.471 kg)  BMI 41.06 kg/m2  SpO2 98%        Objective:   Physical Exam  Constitutional: He is oriented to person, place, and time. He appears well-developed and well-nourished. No distress.  HENT:  Head: Normocephalic and atraumatic.  Neck: Normal range of motion.  Cardiovascular: Normal rate, regular rhythm and normal heart sounds.  Exam reveals no gallop and no friction rub.   No murmur heard. Pulmonary/Chest: Effort normal and breath sounds normal. No respiratory distress. He has no wheezes. He has no rales.  Neurological: He is alert and oriented to person, place, and time.  Skin: Skin is  warm and dry. He is not diaphoretic.  Psychiatric: He has a normal mood and affect. His behavior is normal. Thought content normal.          Assessment & Plan:  Patient seen along with [facility] NP-student.  I have personally seen and examined patient and agree with Ms. Whitmire's assessment and plan.

## 2014-02-24 NOTE — Progress Notes (Signed)
Pre visit review using our clinic review tool, if applicable. No additional management support is needed unless otherwise documented below in the visit note. 

## 2014-02-24 NOTE — Assessment & Plan Note (Signed)
Resolved following cervical fusion, management per neurosurgery.

## 2014-02-24 NOTE — Assessment & Plan Note (Signed)
Had cervical fusion 01/2014 by Dr. Newell CoralNudelman. Neck pain is alleviated.

## 2014-02-24 NOTE — Assessment & Plan Note (Signed)
Depression is alleviated. Continue citalopram and venlafaxine.

## 2014-02-25 ENCOUNTER — Encounter: Payer: Self-pay | Admitting: Family

## 2014-02-26 ENCOUNTER — Telehealth: Payer: Self-pay | Admitting: Family

## 2014-02-26 DIAGNOSIS — N529 Male erectile dysfunction, unspecified: Secondary | ICD-10-CM

## 2014-02-26 NOTE — Telephone Encounter (Signed)
Caller name:Duff Noseworthy Relation to ZO:XWRUpt:self Call back [phone] Pharmacy:  Reason for call: pt would like for you to call him, states you informed him to call you back if he had any questions concerning his meds.

## 2014-02-27 NOTE — Telephone Encounter (Signed)
Patient returned phone call. [facility] # [phone].

## 2014-02-27 NOTE — Telephone Encounter (Signed)
See mychart.  

## 2014-02-27 NOTE — Telephone Encounter (Signed)
Left message on pt's voicemail to call back or send mychart message with detailed questions.

## 2014-02-27 NOTE — Telephone Encounter (Signed)
Attempted to reach pt and left message that he may leave detailed questions / concerns with telephone operator or send us a mychart message if he doesn't want to go into detail with operators.

## 2014-02-27 NOTE — Telephone Encounter (Signed)
Pt called stating he has had no improvement with erectile dysfunction. Has been occuring x 2 weeks. He said he was told at his last visit to call back if there was no improvement.  Please advise.

## 2014-03-07 ENCOUNTER — Encounter: Payer: Self-pay | Admitting: Family

## 2014-03-11 ENCOUNTER — Ambulatory Visit (INDEPENDENT_AMBULATORY_CARE_PROVIDER_SITE_OTHER): Payer: BC Managed Care – PPO | Admitting: Family

## 2014-03-11 ENCOUNTER — Telehealth: Payer: Self-pay | Admitting: *Deleted

## 2014-03-11 ENCOUNTER — Encounter: Payer: Self-pay | Admitting: Family

## 2014-03-11 DIAGNOSIS — N529 Male erectile dysfunction, unspecified: Secondary | ICD-10-CM

## 2014-03-11 DIAGNOSIS — I10 Essential (primary) hypertension: Secondary | ICD-10-CM

## 2014-03-11 LAB — BASIC METABOLIC PANEL
BUN: 13 mg/dL (ref 6–23)
CO2: 26 mEq/L (ref 19–32)
Calcium: 9.2 mg/dL (ref 8.4–10.5)
Chloride: 106 mEq/L (ref 96–112)
Creatinine, Ser: 0.89 mg/dL (ref 0.40–1.50)
GFR: 100.04 mL/min (ref >60.00)
Glucose, Bld: 98 mg/dL (ref 70–99)
Potassium: 3.9 mEq/L (ref 3.5–5.1)
Sodium: 138 mEq/L (ref 135–145)

## 2014-03-11 MED ORDER — TADALAFIL 20 MG PO TABS: 10.0000 mg | ORAL_TABLET | ORAL | Status: DC | PRN

## 2014-03-11 NOTE — Progress Notes (Signed)
Pre visit review using our clinic review tool, if applicable. No additional management support is needed unless otherwise documented below in the visit note. 

## 2014-03-11 NOTE — Patient Instructions (Signed)
Please complete lab work prior to leaving. Start cialis as needed. Follow up in 3 months.

## 2014-03-11 NOTE — Progress Notes (Signed)
Subjective:    Patient ID: Devin [name], male    DOB: 01-28-1971, 44 y.o.   MRN: [id number]  HPI Mr. Manolis is here today for follow up for HTN. 1. HYPERTENSION: Reports compliance with HCTZ.  The patient denies the following associated symptoms: chest pain, dyspnea, blurred vision, headache, or lower extremity edema.  BP Readings from Last 3 Encounters:  03/11/14 114/80  02/24/14 110/78  12/18/13 118/60  Reports no exercise. Wt Readings from Last 3 Encounters:  03/11/14 269 lb 3.2 oz (122.108 kg)  02/24/14 270 lb (122.471 kg)  12/18/13 277 lb 12.8 oz (126.009 kg)    2. Reports trouble with maintaining and erection for about a month. Has been off of propranolol for over a month.   3. Neck pain-resolved since cervical fusion in December. Reports hoarseness in his voice which surgeon told him is to be expected following surgery. Will follow up with Dr. Newell Coral (surgeon) in 2 months.  Review of Systems Denies depression/anxiety.     Past Medical History  Diagnosis Date  . Hypertension     controlled  . History of MRSA infection   . Keratoconus of both eyes     History   Social History  . Marital Status: Married    Spouse Name: N/A    Number of Children: 3  . Years of Education: N/A   Occupational History  . [name]    Social History Main Topics  . Smoking status: Never Smoker   . Smokeless tobacco: Never Used  . Alcohol Use: No  . Drug Use: Not on file  . Sexual Activity: Not on file   Other Topics Concern  . Not on file   Social History Narrative   Regular exercise: yes (runs and uses elliptical machine)   Quit school in the 8th grade.    Past Surgical History  Procedure Laterality Date  . Neck surgery  02/03/14    Family History  Problem Relation Age of Onset  . Diabetes Mother   . Hyperlipidemia Mother   . Heart disease Mother     CHF  . Thyroid disease Mother     Graves Disease  . Hypertension Father   . Asthma Daughter   . ADD /  ADHD Daughter     Allergies  Allergen Reactions  . Penicillins     REACTION: causes rash and sob.    Current Outpatient Prescriptions on File Prior to Visit  Medication Sig Dispense Refill  . citalopram (CELEXA) 20 MG tablet 10 mg. Take one tab by mouth daily    . hydrochlorothiazide (HYDRODIURIL) 25 MG tablet Take 1 tablet (25 mg total) by mouth daily. 30 tablet 0  . venlafaxine (EFFEXOR) 37.5 MG tablet TAKE ONE TABLET BY MOUTH TWICE DAILY 60 tablet 2   No current facility-administered medications on file prior to visit.    BP 114/80 mmHg  Pulse 54  Temp(Src) 97.7 F (36.5 C) (Oral)  Resp 18  Ht  (1.727 m)  Wt 269 lb 3.2 oz (122.108 kg)  BMI 40.94 kg/m2  SpO2 98%    Objective:   Physical Exam  Constitutional: He is oriented to person, place, and time. He appears well-developed and well-nourished. No distress.  HENT:  Head: Normocephalic and atraumatic.  Cardiovascular: Normal rate, regular rhythm and normal heart sounds.  Exam reveals no gallop and no friction rub.   No murmur heard. Pulmonary/Chest: Effort normal and breath sounds normal. No respiratory distress. He has no wheezes. He has  no rales.  Musculoskeletal: He exhibits no edema.  Neurological: He is alert and oriented to person, place, and time.  Skin: Skin is warm and dry. He is not diaphoretic.  Psychiatric: He has a normal mood and affect. His behavior is normal. Judgment and thought content normal.          Assessment & Plan:

## 2014-03-11 NOTE — Telephone Encounter (Signed)
No lavender was drawn today; a1c cannot be added. Pt is supposed to follow up in 3 months. Can we do this at that time?

## 2014-03-11 NOTE — Telephone Encounter (Signed)
It was not, could you please see if lab can add on A1C? Dx hyperglycemia.

## 2014-03-11 NOTE — Telephone Encounter (Signed)
Received email from pharmacy re: prior auth for cialis rx.  PA initiated via covermymeds. Awaiting determination.

## 2014-03-11 NOTE — Telephone Encounter (Signed)
Please contact pt re: unread mychart message. 

## 2014-03-11 NOTE — Telephone Encounter (Signed)
Pt was seen in the office today. Do you know if this was addressed at the visit?

## 2014-03-11 NOTE — Assessment & Plan Note (Signed)
Stable on citalopram and venlafaxine. Continue at same dose.

## 2014-03-11 NOTE — Telephone Encounter (Signed)
Sugar normal today. Will repeat in 3 mos.

## 2014-03-11 NOTE — Assessment & Plan Note (Addendum)
BP controlled on hctz. Obtain BMET. Discussed beginning exercise for weight control and HTN -  walking 30 minutes 5 days per week. Follow up in 3 months.

## 2014-03-11 NOTE — Assessment & Plan Note (Addendum)
Rx for cialis.  Will also check serum testosterone level (free and total). It is possible that effexor and celexa could be contributing to ED, however his depression is currently well controlled and I hope not to have to d/c these meds.

## 2014-03-11 NOTE — Assessment & Plan Note (Signed)
Stable management per Dr. Newell CoralNudelman. Recovering well.

## 2014-03-12 LAB — TESTOSTERONE, FREE, TOTAL, SHBG
SEX HORMONE BINDING: 15 nmol/L (ref 10–50)
TESTOSTERONE: 147 ng/dL — AB (ref 300–890)
Testosterone, Free: 40.1 pg/mL — ABNORMAL LOW (ref 47.0–244.0)
Testosterone-% Free: 2.7 % (ref 1.6–2.9)

## 2014-03-14 NOTE — Telephone Encounter (Signed)
Devin [name], please contact pt and let him know that his testosterone is low.  I will forward this info to urology.  Marj- could you please forward testosterone level to urology?

## 2014-03-16 NOTE — Telephone Encounter (Signed)
Notified pt re: Cialis denial and urology referral and pt is agreeable to proceed.

## 2014-03-16 NOTE — Telephone Encounter (Signed)
Patient has ED was this indicated on the form?

## 2014-03-16 NOTE — Telephone Encounter (Signed)
PA for cialis denied. Will cover if patient is diagnosed with BPH and/or erectile dysfunction.

## 2014-04-14 ENCOUNTER — Telehealth: Payer: Self-pay | Admitting: Family

## 2014-04-14 ENCOUNTER — Ambulatory Visit (INDEPENDENT_AMBULATORY_CARE_PROVIDER_SITE_OTHER): Payer: BC Managed Care – PPO | Admitting: Family

## 2014-04-14 ENCOUNTER — Ambulatory Visit (HOSPITAL_BASED_OUTPATIENT_CLINIC_OR_DEPARTMENT_OTHER)
Admission: RE | Admit: 2014-04-14 | Discharge: 2014-04-14 | Disposition: A | Discharge status: Home / Self Care | Payer: BLUE CROSS/BLUE SHIELD | Source: Ambulatory Visit | Attending: Family | Admitting: Family

## 2014-04-14 ENCOUNTER — Encounter: Payer: Self-pay | Admitting: Family

## 2014-04-14 VITALS — BP 140/65 | HR 86 | Temp 98.6°F | Resp 20 | Ht 69.0 in | Wt 272.0 lb

## 2014-04-14 DIAGNOSIS — R0989 Other specified symptoms and signs involving the circulatory and respiratory systems: Secondary | ICD-10-CM | POA: Insufficient documentation

## 2014-04-14 DIAGNOSIS — R05 Cough: Secondary | ICD-10-CM | POA: Diagnosis not present

## 2014-04-14 DIAGNOSIS — R059 Cough, unspecified: Secondary | ICD-10-CM

## 2014-04-14 DIAGNOSIS — J209 Acute bronchitis, unspecified: Secondary | ICD-10-CM | POA: Insufficient documentation

## 2014-04-14 MED ORDER — PREDNISONE 10 MG PO TABS: ORAL_TABLET | ORAL | Status: DC

## 2014-04-14 NOTE — Patient Instructions (Addendum)
Please complete chest x ray on the first floor. Start prednisone taper, continue doxycycline, continue albuterol as needed. Follow up if fever >101 or if symptoms are not improved in 1 week.

## 2014-04-14 NOTE — Progress Notes (Signed)
Subjective:    Patient ID: Devin [name], male    DOB: 1970-04-14, 44 y.o.   MRN: [id number]  HPI  Devin [name] is a 44 yr old male who presents today with chief complaint of cough.  Reports that symptoms started 3 weeks ago.  He was seen in urgent care and was treated with levofloxacin.  He complete levofloxacin and symptoms were not improved. He returned to urgent care, was started on doxycycline since 2/15, which he is currently taking. Has been using hycodan cough syrup which helps some and helps him to sleep.  He does have albuterol inhaler which he has used about 3 times, though was not sure that he was using correctly.   Reports that he continues to cough.  He reports that he had a chest x ray initially and was told that he did not have pneumonia.  Denies significant post nasal drip. Denies GERD symptoms. Cough is productive at times of small amount of clear sputum.     Review of Systems    see HPI  Past Medical History  Diagnosis Date  . Hypertension     controlled  . History of MRSA infection   . Keratoconus of both eyes     History   Social History  . Marital Status: Married    Spouse Name: N/A  . Number of Children: 3  . Years of Education: N/A   Occupational History  . Devin [name]    Social History Main Topics  . Smoking status: Never Smoker   . Smokeless tobacco: Never Used  . Alcohol Use: No  . Drug Use: Not on file  . Sexual Activity: Not on file   Other Topics Concern  . Not on file   Social History Narrative   Regular exercise: yes (runs and uses elliptical machine)   Quit school in the 8th grade.    Past Surgical History  Procedure Laterality Date  . Neck surgery  02/03/14    Family History  Problem Relation Age of Onset  . Diabetes Mother   . Hyperlipidemia Mother   . Heart disease Mother     CHF  . Thyroid disease Mother     Graves Disease  . Hypertension Father   . Asthma Daughter   . ADD / ADHD Daughter     Allergies    Allergen Reactions  . Penicillins     REACTION: causes rash and sob.  . Shellfish Allergy     shrimp    Current Outpatient Prescriptions on File Prior to Visit  Medication Sig Dispense Refill  . citalopram (CELEXA) 20 MG tablet 10 mg. Take one tab by mouth daily    . hydrochlorothiazide (HYDRODIURIL) 25 MG tablet Take 1 tablet (25 mg total) by mouth daily. 30 tablet 0  . venlafaxine (EFFEXOR) 37.5 MG tablet TAKE ONE TABLET BY MOUTH TWICE DAILY 60 tablet 2  . tadalafil (CIALIS) 20 MG tablet Take 0.5-1 tablets (10-20 mg total) by mouth every other day as needed for erectile dysfunction. (Patient not taking: Reported on 04/14/2014) 5 tablet 5   No current facility-administered medications on file prior to visit.    BP 140/65 mmHg  Pulse 86  Temp(Src) 98.6 F (37 C) (Oral)  Resp 20  Ht  (1.753 m)  Wt 272 lb (123.378 kg)  BMI 40.15 kg/m2  SpO2 97%    Objective:   Physical Exam  Constitutional: He is oriented to person, place, and time. He appears well-developed and well-nourished. No  distress.  HENT:  Head: Normocephalic and atraumatic.  Right Ear: Tympanic membrane and ear canal normal.  Left Ear: Tympanic membrane and ear canal normal.  Cardiovascular: Normal rate and regular rhythm.   No murmur heard. Pulmonary/Chest: Effort normal and breath sounds normal. No respiratory distress. He has no wheezes. He has no rales.  Musculoskeletal: He exhibits no edema.  Neurological: He is alert and oriented to person, place, and time.  Skin: Skin is warm and dry.  Psychiatric: He has a normal mood and affect. His behavior is normal. Thought content normal.          Assessment & Plan:

## 2014-04-14 NOTE — Telephone Encounter (Signed)
Caller name: Devin [name], Devin [name] Relation to pt: self  Call back number: [phone] [facility] [address] - [place], [place] - [address] [phone] (Phone) [phone] (Fax)         Reason for call:  Pt is inquiring about the fever blister RX.

## 2014-04-14 NOTE — Assessment & Plan Note (Signed)
No overt wheezing today but he continues with a dry hacking cough noted in exam room. Will obtain follow up cxr to exclude pna, continue albuterol and add prednisone taper.  I suspect some bronchospastic component to his cough.  Pt is advised to: Please complete chest x ray on the first floor. Start prednisone taper, continue doxycycline, continue albuterol as needed. Follow up if fever >101 or if symptoms are not improved in 1 week.

## 2014-04-14 NOTE — Progress Notes (Signed)
Pre visit review using our clinic review tool, if applicable. No additional management support is needed unless otherwise documented below in the visit note. 

## 2014-04-15 MED ORDER — VALACYCLOVIR HCL 1 G PO TABS: ORAL_TABLET | ORAL | Status: DC

## 2014-06-30 NOTE — Telephone Encounter (Signed)
Yes. Erectile dysfunction was the only diagnosis listed on the form.

## 2014-08-25 ENCOUNTER — Telehealth: Payer: Self-pay | Admitting: *Deleted

## 2014-08-25 MED ORDER — HYDROCHLOROTHIAZIDE 25 MG PO TABS: 25.0000 mg | ORAL_TABLET | Freq: Every day | ORAL | Status: DC

## 2014-08-25 NOTE — Telephone Encounter (Signed)
Received refill request from [facility] dated 08/19/14 for HCTZ 25mg  once a day. Last refill 02/2014. Last office visit 04/14/14 for acute visit. Refills sent to pharmacy.  When should pt follow up in the office?

## 2014-08-26 NOTE — Telephone Encounter (Signed)
Lets bring him in this month please.

## 2014-08-27 NOTE — Telephone Encounter (Signed)
Notified pt and scheduled f/u for 09/18/14 at 7am.

## 2014-09-18 ENCOUNTER — Encounter: Payer: Self-pay | Admitting: Family

## 2014-09-18 ENCOUNTER — Ambulatory Visit (INDEPENDENT_AMBULATORY_CARE_PROVIDER_SITE_OTHER): Payer: BLUE CROSS/BLUE SHIELD | Admitting: Family

## 2014-09-18 VITALS — BP 130/84 | HR 67 | Temp 97.7°F | Resp 16 | Ht 69.0 in | Wt 268.0 lb

## 2014-09-18 DIAGNOSIS — I10 Essential (primary) hypertension: Secondary | ICD-10-CM

## 2014-09-18 DIAGNOSIS — F418 Other specified anxiety disorders: Secondary | ICD-10-CM | POA: Diagnosis not present

## 2014-09-18 DIAGNOSIS — E669 Obesity, unspecified: Secondary | ICD-10-CM | POA: Insufficient documentation

## 2014-09-18 NOTE — Assessment & Plan Note (Signed)
Currently stable off of meds. Advised pt to call if he begins to develop recurrent depression symptoms.  He verbalizes understanding.

## 2014-09-18 NOTE — Assessment & Plan Note (Signed)
Discussed adding cardio back into his routine.

## 2014-09-18 NOTE — Progress Notes (Signed)
Subjective:    Patient ID: Devin [name], male    DOB: [birth year], 44 y.o.   MRN: [id number]  HPI  Devin [name] is a 44 yr old male who presents today for follow up.  1) Depression/Anxiety-  Reports that he has not bee taking citalopram or effexor. Stopped meds months ago.  Reports feeling well off of meds.    2) HTN-  Reports that he stopped the hctz due to feeling faint while taking.  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 Hypertension     controlled  . History of MRSA infection   . Keratoconus of both eyes     History   Social History  . Marital Status: Married    Spouse Name: N/A  . Number of Children: 3  . Years of Education: N/A   Occupational History  . [name]    Social History Main Topics  . Smoking status: Never Smoker   . Smokeless tobacco: Never Used  . Alcohol Use: No  . Drug Use: Not on file  . Sexual Activity: Not on file   Other Topics Concern  . Not on file   Social History Narrative   Regular exercise: yes (runs and uses elliptical machine)   Quit school in the 8th grade.    Past Surgical History  Procedure Laterality Date  . Neck surgery  02/03/14    Family History  Problem Relation Age of Onset  . Diabetes Mother   . Hyperlipidemia Mother   . Heart disease Mother     CHF  . Thyroid disease Mother     Graves Disease  . Hypertension Father   . Asthma Daughter   . ADD / ADHD Daughter     Allergies  Allergen Reactions  . Penicillins     REACTION: causes rash and sob.  . Shellfish Allergy     shrimp    Current Outpatient Prescriptions on File Prior to Visit    Medication Sig Dispense Refill  . Albuterol Sulfate 108 (90 BASE) MCG/ACT AEPB Inhale into the lungs.    . citalopram (CELEXA) 20 MG tablet 10 mg. Take one tab by mouth daily    . doxycycline (VIBRA-TABS) 100 MG tablet Take 100 mg by mouth 2 (two) times daily.    . hydrochlorothiazide (HYDRODIURIL) 25 MG tablet Take 1 tablet (25 mg total) by mouth daily. 30 tablet 3  . HYDROcodone-homatropine (HYCODAN) 5-1.5 MG/5ML syrup Take 5 mLs by mouth every 6 (six) hours as needed for cough.    . predniSONE (DELTASONE) 10 MG tablet 4 tabs by mouth once daily for 2 days, then 3 tabs daily x 2 days, then 2 tabs daily x 2 days, then 1 tab daily x 2 days then stop 20 tablet 0  . tadalafil (CIALIS) 20 MG tablet Take 0.5-1 tablets (10-20 mg total) by mouth every other day as needed for erectile dysfunction. (Patient not taking: Reported on 04/14/2014) 5 tablet 5  . valACYclovir (VALTREX) 1000 MG tablet 2 tabs PO now, then 2 tabs PO in 12 hrs  as needed for cold sores 12 tablet 1  . venlafaxine (EFFEXOR) 37.5 MG tablet TAKE ONE TABLET BY MOUTH TWICE DAILY 60 tablet 2   No current facility-administered medications on file prior to visit.    BP 130/84 mmHg  Pulse 67  Temp(Src) 97.7 F (36.5 C) (Oral)  Resp 16  Ht  (1.753 m)  Wt 268 lb (121.564 kg)  BMI 39.56 kg/m2  SpO2 99%       Objective:   Physical Exam  Constitutional: He is oriented to person, place, and time. He appears well-developed and well-nourished. No distress.  HENT:  Head: Normocephalic and atraumatic.  Cardiovascular: Normal rate and regular rhythm.   No murmur heard. Pulmonary/Chest: Effort normal and breath sounds normal. No respiratory distress. He has no wheezes. He has no rales.  Musculoskeletal: He exhibits no edema.  Neurological: He is alert and oriented to person, place, and time.  Skin: Skin is warm and dry.  Psychiatric: He has a normal mood and affect. His behavior is normal. Thought content normal.           Assessment & Plan:

## 2014-09-18 NOTE — Progress Notes (Signed)
Pre visit review using our clinic review tool, if applicable. No additional management support is needed unless otherwise documented below in the visit note. 

## 2014-09-18 NOTE — Patient Instructions (Signed)
Call if you develop depression symptoms. Try to add some regular cardio to your routine to help with weight loss.

## 2014-09-18 NOTE — Assessment & Plan Note (Signed)
BP looks ok off of HCTZ. Advised pt ok to remain off. He will monitor blood pressure at home and let me know if he begins to get readings >130/90.

## 2014-12-18 ENCOUNTER — Ambulatory Visit: Payer: BLUE CROSS/BLUE SHIELD | Admitting: Family

## 2014-12-25 ENCOUNTER — Ambulatory Visit (INDEPENDENT_AMBULATORY_CARE_PROVIDER_SITE_OTHER): Payer: BLUE CROSS/BLUE SHIELD | Admitting: Family

## 2014-12-25 ENCOUNTER — Encounter: Payer: Self-pay | Admitting: Family

## 2014-12-25 VITALS — BP 136/68 | HR 56 | Temp 98.1°F | Resp 18 | Ht 69.0 in | Wt 270.0 lb

## 2014-12-25 DIAGNOSIS — R0789 Other chest pain: Secondary | ICD-10-CM | POA: Diagnosis not present

## 2014-12-25 DIAGNOSIS — R079 Chest pain, unspecified: Secondary | ICD-10-CM

## 2014-12-25 DIAGNOSIS — I10 Essential (primary) hypertension: Secondary | ICD-10-CM | POA: Diagnosis not present

## 2014-12-25 DIAGNOSIS — Z23 Encounter for immunization: Secondary | ICD-10-CM | POA: Diagnosis not present

## 2014-12-25 DIAGNOSIS — F418 Other specified anxiety disorders: Secondary | ICD-10-CM

## 2014-12-25 MED ORDER — BUPROPION HCL 75 MG PO TABS: ORAL_TABLET | ORAL | Status: DC

## 2014-12-25 NOTE — Progress Notes (Signed)
Subjective:    Patient ID: Devin [name], male    DOB: 04/07/1970, 44 y.o.   MRN: [id number]  HPI   Devin [name] is a 44 yr old male who presents today for follow up.  1) HTN- not on meds.  Not exercising, working more.   BP Readings from Last 3 Encounters:  12/25/14 136/68  09/18/14 130/84  04/14/14 140/65   2) Depression/anxiety-reports some days he "doesn't even care to get up."  Denies SI. Wants to start a medication- worried about gaining more weight.   3) Chest pain- Pt reports that he has had some sharp substernal chest pains. These occurs only at rest when laying down. Denies associated SOB.  Reports some anterior chest tenderness.  Pain is made worse by moving his arm.    Review of Systems See HPI  Past Medical History  Diagnosis Date  . Hypertension     controlled  . History of MRSA infection   . Keratoconus of both eyes     Social History   Social History  . Marital Status: Married    Spouse Name: N/A  . Number of Children: 3  . 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 Smoking status: Never Smoker   . Smokeless tobacco: Never Used  . Alcohol Use: No  . Drug Use: Not on file  . Sexual Activity: Not on file   Other Topics Concern  . Not on file   Social History Narrative   Regular exercise: yes (runs and uses elliptical machine)   Quit school in the 8th grade.    Past Surgical History  Procedure Laterality Date  . Neck surgery  02/03/14    Family History  Problem Relation Age of Onset  . Diabetes Mother   . Hyperlipidemia Mother   . Heart disease Mother     CHF  . Thyroid disease Mother     Graves Disease  . Hypertension Father   . Asthma Daughter   . ADD / ADHD Daughter     Allergies  Allergen Reactions  . Penicillins     REACTION: causes rash and sob.  . Shellfish Allergy     shrimp    Current Outpatient Prescriptions on File Prior to Visit  Medication Sig Dispense  Refill  . valACYclovir (VALTREX) 1000 MG tablet 2 tabs PO now, then 2 tabs PO in 12 hrs as needed for cold sores (Patient not taking: Reported on 12/25/2014) 12 tablet 1   No current facility-administered medications on file prior to visit.    BP 136/68 mmHg  Pulse 56  Temp(Src) 98.1 F (36.7 C) (Oral)  Resp 18  Ht 5\' 9"  (1.753 m)  Wt 270 lb (122.471 kg)  BMI 39.85 kg/m2  SpO2 100%       Objective:   Physical Exam  Constitutional: He is oriented to person, place, and time. He appears well-developed and well-nourished. No distress.  HENT:  Head: Normocephalic and atraumatic.  Cardiovascular: Normal rate and regular rhythm.   No murmur heard. Pulmonary/Chest: Effort normal and breath sounds normal. No respiratory distress. He has no wheezes. He has no rales.  Musculoskeletal: He exhibits no edema.  Neurological: He is alert and oriented to person, place, and time.  Skin: Skin is warm and dry.  Psychiatric: He has a normal mood and affect. His behavior is normal. Thought content normal.  Assessment & Plan:  EKG tracing is personally reviewed.  EKG notes NSR (sinus Bradycardia).  No acute changes.

## 2014-12-25 NOTE — Progress Notes (Signed)
Pre visit review using our clinic review tool, if applicable. No additional management support is needed unless otherwise documented below in the visit note. 

## 2014-12-25 NOTE — Patient Instructions (Addendum)
You will be contacted about your referral for stress test.   Go to ER if you develop recurrent chest pain. Start wellbutrin for depression.

## 2014-12-27 DIAGNOSIS — R0789 Other chest pain: Secondary | ICD-10-CM | POA: Insufficient documentation

## 2014-12-27 NOTE — Assessment & Plan Note (Signed)
BP stable, monitor.  

## 2014-12-27 NOTE — Assessment & Plan Note (Signed)
Deteriorated.  Trial of wellbutrin for depression and will also help some with weight loss efforts.

## 2014-12-27 NOTE — Assessment & Plan Note (Signed)
EKG NSR. Will refer for stress test for further evaluation. Doubt cardiac etiology.

## 2015-01-22 ENCOUNTER — Encounter: Payer: Self-pay | Admitting: Family

## 2015-01-22 ENCOUNTER — Ambulatory Visit (INDEPENDENT_AMBULATORY_CARE_PROVIDER_SITE_OTHER): Payer: BLUE CROSS/BLUE SHIELD | Admitting: Family

## 2015-01-22 VITALS — BP 129/71 | HR 68 | Temp 98.0°F | Resp 18 | Ht 69.0 in | Wt 268.6 lb

## 2015-01-22 DIAGNOSIS — R258 Other abnormal involuntary movements: Secondary | ICD-10-CM | POA: Diagnosis not present

## 2015-01-22 DIAGNOSIS — R259 Unspecified abnormal involuntary movements: Principal | ICD-10-CM

## 2015-01-22 DIAGNOSIS — R0789 Other chest pain: Secondary | ICD-10-CM | POA: Diagnosis not present

## 2015-01-22 DIAGNOSIS — G252 Other specified forms of tremor: Secondary | ICD-10-CM

## 2015-01-22 DIAGNOSIS — F418 Other specified anxiety disorders: Secondary | ICD-10-CM | POA: Diagnosis not present

## 2015-01-22 MED ORDER — BUPROPION HCL ER (XL) 300 MG PO TB24: 300.0000 mg | ORAL_TABLET | Freq: Every day | ORAL | Status: DC

## 2015-01-22 NOTE — Assessment & Plan Note (Signed)
Atypical CP- stable, check status of stress test scheduling.

## 2015-01-22 NOTE — Progress Notes (Signed)
Subjective:    Patient ID: Devin BrackenJamie L [name], male    DOB: 1970/03/14, 44 y.o.   MRN: [id number]  HPI  Devin [name] is a 44 yr old male who presents today for follow up of his depression. Last visit he reported worsening depression symptoms. He was started on wellbutrin. He reports mood is essentially unchanged.  He has not drank ETOH x 2 months.  Started walking 30 min a day.  Wt Readings from Last 3 Encounters:  01/22/15 268 lb 9.6 oz (121.836 kg)  12/25/14 270 lb (122.471 kg)  09/18/14 268 lb (121.564 kg)   Hand Tremors-  Notes hand tremors have worsened.   Atypical Chest pain- last visit pt was referred for stress test. Denies recent chest pain, has not been contacted re: stress test.   Review of Systems    see HPI  Past Medical History  Diagnosis Date  . Hypertension     controlled  . History of MRSA infection   . Keratoconus of both eyes     Social History   Social History  . Marital Status: Married    Spouse Name: N/A  . Number of Children: 3  . 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 Smoking status: Never Smoker   . Smokeless tobacco: Never Used  . Alcohol Use: No  . Drug Use: Not on file  . Sexual Activity: Not on file   Other Topics Concern  . Not on file   Social History Narrative   Regular exercise: yes (runs and uses elliptical machine)   Quit school in the 8th grade.    Past Surgical History  Procedure Laterality Date  . Neck surgery  02/03/14    Family History  Problem Relation Age of Onset  . Diabetes Mother   . Hyperlipidemia Mother   . Heart disease Mother     CHF  . Thyroid disease Mother     Graves Disease  . Hypertension Father   . Asthma Daughter   . ADD / ADHD Daughter     Allergies  Allergen Reactions  . Penicillins     REACTION: causes rash and sob.  . Shellfish Allergy     shrimp    Current Outpatient Prescriptions on File Prior to Visit  Medication Sig  Dispense Refill  . buPROPion (WELLBUTRIN) 75 MG tablet One tab once daily for 3 days, then increase to one tab twice daily 60 tablet 0  . valACYclovir (VALTREX) 1000 MG tablet 2 tabs PO now, then 2 tabs PO in 12 hrs as needed for cold sores (Patient not taking: Reported on 12/25/2014) 12 tablet 1   No current facility-administered medications on file prior to visit.    BP 129/71 mmHg  Pulse 68  Temp(Src) 98 F (36.7 C) (Oral)  Resp 18  Ht 5\' 9"  (1.753 m)  Wt 268 lb 9.6 oz (121.836 kg)  BMI 39.65 kg/m2  SpO2 98%    Objective:   Physical Exam  Constitutional: He is oriented to person, place, and time. He appears well-developed and well-nourished. No distress.  HENT:  Head: Normocephalic and atraumatic.  Cardiovascular: Normal rate and regular rhythm.   No murmur heard. Pulmonary/Chest: Effort normal and breath sounds normal. No respiratory distress. He has no wheezes. He has no rales.  Musculoskeletal: He exhibits no edema.  Neurological: He is alert and oriented to person, place, and time.  Skin: Skin is warm and  dry.  Psychiatric: He has a normal mood and affect. His behavior is normal. Thought content normal.          Assessment & Plan:

## 2015-01-22 NOTE — Assessment & Plan Note (Signed)
Depression is unchanged on current dose of wellbutrin. Increase to 300mg  xl once daily, follow up in 6 weeks.

## 2015-01-22 NOTE — Progress Notes (Signed)
Pre visit review using our clinic review tool, if applicable. No additional management support is needed unless otherwise documented below in the visit note. 

## 2015-01-22 NOTE — Assessment & Plan Note (Signed)
Deteriorated, refer to neuro.

## 2015-01-22 NOTE — Patient Instructions (Signed)
Increase wellbutrin to 300mg  once daily. You will be contacted about your referral to neurology and we will check status of you stress test appointment.

## 2015-01-28 ENCOUNTER — Ambulatory Visit: Payer: BLUE CROSS/BLUE SHIELD | Admitting: Neurology

## 2015-02-02 ENCOUNTER — Telehealth: Payer: Self-pay | Admitting: Family

## 2015-02-02 MED ORDER — VALACYCLOVIR HCL 1 G PO TABS: ORAL_TABLET | ORAL | Status: DC

## 2015-02-02 NOTE — Telephone Encounter (Signed)
Caller name:eILENE Relationship to patient:WIFE Can be [phone] [facility] Reason for call:HAS A FEVER BLISTER NEEDS THE [facility] CALL IN

## 2015-02-02 NOTE — Telephone Encounter (Signed)
Rx sent, notified pt. 

## 2015-03-05 ENCOUNTER — Ambulatory Visit: Payer: BLUE CROSS/BLUE SHIELD | Admitting: Family

## 2015-03-05 ENCOUNTER — Telehealth: Payer: Self-pay | Admitting: Family

## 2015-03-08 ENCOUNTER — Ambulatory Visit (INDEPENDENT_AMBULATORY_CARE_PROVIDER_SITE_OTHER): Payer: BLUE CROSS/BLUE SHIELD | Admitting: Family

## 2015-03-08 ENCOUNTER — Telehealth: Payer: Self-pay | Admitting: Family

## 2015-03-08 ENCOUNTER — Encounter: Payer: Self-pay | Admitting: Family

## 2015-03-08 VITALS — BP 126/83 | HR 53 | Temp 98.3°F | Resp 16 | Ht 69.0 in | Wt 265.6 lb

## 2015-03-08 DIAGNOSIS — R0789 Other chest pain: Secondary | ICD-10-CM

## 2015-03-08 DIAGNOSIS — G252 Other specified forms of tremor: Secondary | ICD-10-CM

## 2015-03-08 DIAGNOSIS — F418 Other specified anxiety disorders: Secondary | ICD-10-CM

## 2015-03-08 DIAGNOSIS — R259 Unspecified abnormal involuntary movements: Secondary | ICD-10-CM

## 2015-03-08 DIAGNOSIS — R258 Other abnormal involuntary movements: Secondary | ICD-10-CM | POA: Diagnosis not present

## 2015-03-08 MED ORDER — BUPROPION HCL ER (XL) 300 MG PO TB24: 300.0000 mg | ORAL_TABLET | Freq: Every day | ORAL | Status: DC

## 2015-03-08 NOTE — Assessment & Plan Note (Signed)
Improved on wellbutrin 300mg .

## 2015-03-08 NOTE — Assessment & Plan Note (Signed)
Unchanged, pt will reschedule with neurology.

## 2015-03-08 NOTE — Assessment & Plan Note (Signed)
Will check status of stress test scheduling.

## 2015-03-08 NOTE — Patient Instructions (Addendum)
Please continue current dose of wellbutrin. We will check the status of your exercise stress test. Please reschedule neurology appointment when you are able.

## 2015-03-08 NOTE — Progress Notes (Signed)
Pre visit review using our clinic review tool, if applicable. No additional management support is needed unless otherwise documented below in the visit note. 

## 2015-03-08 NOTE — Progress Notes (Signed)
Subjective:    Patient ID: Devin [name], male    DOB: 02-19-1971, 45 y.o.   MRN: [id number]  HPI  [name] is a 45 yr old male who presents today for follow up.  1) Depression/anxiety- Last visit he reported ongoing depression symptoms despite wellbutrin 150.  The dose was increased to 300mg  xl once daily.   Feeling better. Tolerating dose, feels more motivated.   2) Resting Tremor- will reschedule with neuro. Was waiting on new insurance card.   3) Atypical CP- has not been contacted re: scheduling of exercise stress test.   Review of Systems See HPI  Past Medical History  Diagnosis Date  . Hypertension     controlled  . History of MRSA infection   . Keratoconus of both eyes     Social History   Social History  . Marital Status: Married    Spouse Name: N/A  . Number of Children: 3  . 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 Smoking status: Never Smoker   . Smokeless tobacco: Never Used  . Alcohol Use: No  . Drug Use: Not on file  . Sexual Activity: Not on file   Other Topics Concern  . Not on file   Social History Narrative   Regular exercise: yes (runs and uses elliptical machine)   Quit school in the 8th grade.    Past Surgical History  Procedure Laterality Date  . Neck surgery  02/03/14    Family History  Problem Relation Age of Onset  . Diabetes Mother   . Hyperlipidemia Mother   . Heart disease Mother     CHF  . Thyroid disease Mother     Graves Disease  . Hypertension Father   . Asthma Daughter   . ADD / ADHD Daughter     Allergies  Allergen Reactions  . Penicillins     REACTION: causes rash and sob.  . Shellfish Allergy     shrimp    Current Outpatient Prescriptions on File Prior to Visit  Medication Sig Dispense Refill  . buPROPion (WELLBUTRIN XL) 300 MG 24 hr tablet Take 1 tablet (300 mg total) by mouth daily. 30 tablet 1  . valACYclovir (VALTREX) 1000 MG tablet 2  tabs PO now, then 2 tabs PO in 12 hrs as needed for cold sores 12 tablet 1   No current facility-administered medications on file prior to visit.    BP 126/83 mmHg  Pulse 53  Temp(Src) 98.3 F (36.8 C) (Oral)  Resp 16  Ht 5\' 9"  (1.753 m)  Wt 265 lb 9.6 oz (120.475 kg)  BMI 39.20 kg/m2  SpO2 100%       Objective:   Physical Exam  Constitutional: He is oriented to person, place, and time. He appears well-developed and well-nourished. No distress.  HENT:  Head: Normocephalic and atraumatic.  Cardiovascular: Normal rate and regular rhythm.   No murmur heard. Pulmonary/Chest: Effort normal and breath sounds normal. No respiratory distress. He has no wheezes. He has no rales.  Musculoskeletal: He exhibits no edema.  Neurological: He is alert and oriented to person, place, and time.  Skin: Skin is warm and dry.  Psychiatric: He has a normal mood and affect. His behavior is normal. Thought content normal.          Assessment & Plan:   Wt Readings from Last 3 Encounters:  03/08/15 265 lb 9.6 oz (120.475  kg)  01/22/15 268 lb 9.6 oz (121.836 kg)  12/25/14 270 lb (122.471 kg)

## 2015-03-08 NOTE — Telephone Encounter (Signed)
Could you please check with [facility] re: exercise stress test scheduling? Pt has not been contacted? thanks

## 2015-03-09 NOTE — Telephone Encounter (Signed)
Pt was no show for appt 03/05/15 8:45am, pt called and rescheduled for 03/08/15 and came in, charge or no charge?

## 2015-03-09 NOTE — Telephone Encounter (Signed)
No charge. 

## 2015-03-09 NOTE — Telephone Encounter (Signed)
Order sent to back to [facility]

## 2015-03-15 ENCOUNTER — Encounter: Payer: Self-pay | Admitting: Family

## 2015-03-15 NOTE — Telephone Encounter (Signed)
Patient is scheduled for 03/30/15 

## 2015-03-15 NOTE — Telephone Encounter (Signed)
This still has not been scheduled, could you please call cardiology and check status? Thank you.

## 2015-03-30 ENCOUNTER — Ambulatory Visit (INDEPENDENT_AMBULATORY_CARE_PROVIDER_SITE_OTHER): Payer: BLUE CROSS/BLUE SHIELD

## 2015-03-30 ENCOUNTER — Encounter: Payer: BLUE CROSS/BLUE SHIELD | Admitting: Physician Assistant

## 2015-03-30 DIAGNOSIS — R079 Chest pain, unspecified: Secondary | ICD-10-CM

## 2015-03-30 LAB — EXERCISE TOLERANCE TEST
CSEPED: 8 min
CSEPEW: 10.1 METS
CSEPPHR: 171 {beats}/min
Exercise duration (sec): 0 s
MPHR: 178 {beats}/min
Percent HR: 96 %
RPE: 16
Rest HR: 69 {beats}/min

## 2015-05-26 ENCOUNTER — Encounter: Payer: Self-pay | Admitting: Family

## 2015-05-26 ENCOUNTER — Ambulatory Visit (INDEPENDENT_AMBULATORY_CARE_PROVIDER_SITE_OTHER): Payer: BLUE CROSS/BLUE SHIELD | Admitting: Family

## 2015-05-26 VITALS — BP 122/72 | HR 54 | Temp 97.7°F | Resp 16 | Ht 69.0 in | Wt 259.4 lb

## 2015-05-26 DIAGNOSIS — F418 Other specified anxiety disorders: Secondary | ICD-10-CM

## 2015-05-26 DIAGNOSIS — L089 Local infection of the skin and subcutaneous tissue, unspecified: Secondary | ICD-10-CM | POA: Diagnosis not present

## 2015-05-26 MED ORDER — VENLAFAXINE HCL 37.5 MG PO TABS: ORAL_TABLET | ORAL | Status: DC

## 2015-05-26 MED ORDER — DOXYCYCLINE HYCLATE 100 MG PO TABS: 100.0000 mg | ORAL_TABLET | Freq: Two times a day (BID) | ORAL | Status: DC

## 2015-05-26 MED ORDER — ALPRAZOLAM 0.25 MG PO TABS: 0.2500 mg | ORAL_TABLET | Freq: Two times a day (BID) | ORAL | Status: DC | PRN

## 2015-05-26 NOTE — Progress Notes (Signed)
Subjective:    Patient ID: Devin BrackenJamie L [name], male    DOB: [birth year], 45 y.o.   MRN: [id number]  HPI  Devin [name] is a 45 yr old male who presents today for follow up of depression/anxiety.   Reports that he has been having panic attacks every few days.  Has been on alprazolam in the past which helped him. (has used an old rx which is helping). Feels like his depression is very well controlled on wellbutrin.  Reports that the anxiety is improved by use of xanax.  Denies SI/HI.  Does continue to have some irritability though.    Review of Systems See HPI  Past Medical History  Diagnosis Date  . Hypertension     controlled  . History of MRSA infection   . Keratoconus of both eyes     Social History   Social History  . Marital Status: Married    Spouse Name: N/A  . Number of Children: 3  . 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 Smoking status: Never Smoker   . Smokeless tobacco: Never Used  . Alcohol Use: No  . Drug Use: Not on file  . Sexual Activity: Not on file   Other Topics Concern  . Not on file   Social History Narrative   Regular exercise: yes (runs and uses elliptical machine)   Quit school in the 8th grade.    Past Surgical History  Procedure Laterality Date  . Neck surgery  02/03/14    Family History  Problem Relation Age of Onset  . Diabetes Mother   . Hyperlipidemia Mother   . Heart disease Mother     CHF  . Thyroid disease Mother     Graves Disease  . Hypertension Father   . Asthma Daughter   . ADD / ADHD Daughter     Allergies  Allergen Reactions  . Penicillins     REACTION: causes rash and sob.  . Shellfish Allergy     shrimp    Current Outpatient Prescriptions on File Prior to Visit  Medication Sig Dispense Refill  . buPROPion (WELLBUTRIN XL) 300 MG 24 hr tablet Take 1 tablet (300 mg total) by mouth daily. 30 tablet 3  . valACYclovir (VALTREX) 1000 MG tablet 2 tabs PO now,  then 2 tabs PO in 12 hrs as needed for cold sores 12 tablet 1   No current facility-administered medications on file prior to visit.    BP 122/72 mmHg  Pulse 54  Temp(Src) 97.7 F (36.5 C) (Oral)  Resp 16  Ht 5\' 9"  (1.753 m)  Wt 259 lb 6.4 oz (117.663 kg)  BMI 38.29 kg/m2  SpO2 98%       Objective:   Physical Exam  Constitutional: He is oriented to person, place, and time. He appears well-developed and well-nourished. No distress.  HENT:  Head: Normocephalic and atraumatic.  Cardiovascular: Normal rate and regular rhythm.   No murmur heard. Pulmonary/Chest: Effort normal and breath sounds normal. No respiratory distress. He has no wheezes. He has no rales.  Musculoskeletal: He exhibits no edema.  Neurological: He is alert and oriented to person, place, and time.  Skin: Skin is warm and dry.  Small pustule right buttock with 1 inch diameter erythema surrounding.   Psychiatric: He has a normal mood and affect. His behavior is normal. Thought content normal.          Assessment &  Plan:  Skin infection- early/mild.  rx with doxy.

## 2015-05-26 NOTE — Assessment & Plan Note (Signed)
Uncontrolled.  Add back effexor.  Continue wellbutrin, add xanax as needed. Controlled substance contract is signed and patient will be sent for UDS.

## 2015-05-26 NOTE — Patient Instructions (Addendum)
Please complete lab work prior to leaving (UDS).  Start effexor.  You may use xanax as needed for anxiety.  Follow up in 1 month.

## 2015-05-26 NOTE — Progress Notes (Signed)
Pre visit review using our clinic review tool, if applicable. No additional management support is needed unless otherwise documented below in the visit note. 

## 2015-06-25 ENCOUNTER — Telehealth: Payer: Self-pay | Admitting: Family

## 2015-06-25 ENCOUNTER — Ambulatory Visit: Payer: BLUE CROSS/BLUE SHIELD | Admitting: Family

## 2015-06-25 DIAGNOSIS — Z0289 Encounter for other administrative examinations: Secondary | ICD-10-CM

## 2015-07-01 NOTE — Telephone Encounter (Signed)
Pt was no show 06/25/15 8:45am follow up appt, pt has not rescheduled, 2nd no show w/in 12 months, charge or no charge?

## 2015-07-04 NOTE — Telephone Encounter (Signed)
Yes please

## 2015-07-05 ENCOUNTER — Encounter: Payer: Self-pay | Admitting: Family

## 2015-07-05 NOTE — Telephone Encounter (Signed)
Marked to charge and mailing no show letter °

## 2015-09-16 ENCOUNTER — Other Ambulatory Visit: Payer: Self-pay | Admitting: Family

## 2015-10-22 ENCOUNTER — Other Ambulatory Visit: Payer: Self-pay | Admitting: Family

## 2015-10-26 NOTE — Telephone Encounter (Signed)
Pt last seen in 05/2015 and advised 1 month follow up of anxiety. Pt cancelled f/u and has not r/s. 2 week supply sent to pharmacy until pt can schedule appt with PCP. Mychart message sent to pt.

## 2015-10-27 ENCOUNTER — Encounter: Payer: Self-pay | Admitting: Family

## 2015-10-27 ENCOUNTER — Ambulatory Visit (INDEPENDENT_AMBULATORY_CARE_PROVIDER_SITE_OTHER): Payer: BLUE CROSS/BLUE SHIELD | Admitting: Family

## 2015-10-27 DIAGNOSIS — R030 Elevated blood-pressure reading, without diagnosis of hypertension: Secondary | ICD-10-CM | POA: Diagnosis not present

## 2015-10-27 DIAGNOSIS — IMO0001 Reserved for inherently not codable concepts without codable children: Secondary | ICD-10-CM | POA: Insufficient documentation

## 2015-10-27 DIAGNOSIS — F418 Other specified anxiety disorders: Secondary | ICD-10-CM

## 2015-10-27 DIAGNOSIS — Z23 Encounter for immunization: Secondary | ICD-10-CM | POA: Diagnosis not present

## 2015-10-27 MED ORDER — BUPROPION HCL ER (XL) 300 MG PO TB24: 300.0000 mg | ORAL_TABLET | Freq: Every day | ORAL | 5 refills | Status: DC

## 2015-10-27 NOTE — Progress Notes (Signed)
Pre visit review using our clinic review tool, if applicable. No additional management support is needed unless otherwise documented below in the visit note. 

## 2015-10-27 NOTE — Assessment & Plan Note (Signed)
Stable on wellbutrin, rarely needing xanax for anxiety.  Refill provided of wellbutrin. Continue same.

## 2015-10-27 NOTE — Progress Notes (Signed)
Subjective:    Patient ID: Devin [name], male    DOB: November 11, 1970, 45 y.o.   MRN: [id number]  HPI  Devin [name] is a 45 yr old male who presents today for follow up of his depression. He reports that he only took effexor for a short while.  He continues wellbutrin. Using xanax.  Has been working a lot.   Wt Readings from Last 3 Encounters:  10/27/15 265 lb 6.4 oz (120.4 kg)  05/26/15 259 lb 6.4 oz (117.7 kg)  03/08/15 265 lb 9.6 oz (120.5 kg)    Elevated blood pressure- pt reports that he has checked his BP a few times at the drug store and it has been elevated.  Reports not eating healthy, not exercising.  BP Readings from Last 3 Encounters:  10/27/15 (!) 143/79  05/26/15 122/72  03/08/15 126/83       Review of Systems    see HPI  Past Medical History:  Diagnosis Date  . History of MRSA infection   . Hypertension    controlled  . Keratoconus of both eyes      Social History   Social History  . Marital status: Married    Spouse name: N/A  . Number of children: 3  . 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 Smoking status: Never Smoker  . Smokeless tobacco: Never Used  . Alcohol use No  . Drug use: Unknown  . Sexual activity: Not on file   Other Topics Concern  . Not on file   Social History Narrative   Regular exercise: yes (runs and uses elliptical machine)   Quit school in the 8th grade.    Past Surgical History:  Procedure Laterality Date  . NECK SURGERY  02/03/14    Family History  Problem Relation Age of Onset  . Diabetes Mother   . Hyperlipidemia Mother   . Heart disease Mother     CHF  . Thyroid disease Mother     Graves Disease  . Hypertension Father   . Asthma Daughter   . ADD / ADHD Daughter     Allergies  Allergen Reactions  . Penicillins     REACTION: causes rash and sob.  . Shellfish Allergy     shrimp    Current Outpatient Prescriptions on File  Prior to Visit  Medication Sig Dispense Refill  . ALPRAZolam (XANAX) 0.25 MG tablet Take 1 tablet (0.25 mg total) by mouth 2 (two) times daily as needed for anxiety. 45 tablet 0  . buPROPion (WELLBUTRIN XL) 300 MG 24 hr tablet TAKE ONE TABLET BY MOUTH ONCE DAILY 14 tablet 0  . valACYclovir (VALTREX) 1000 MG tablet 2 tabs PO now, then 2 tabs PO in 12 hrs as needed for cold sores 12 tablet 1  . venlafaxine (EFFEXOR) 37.5 MG tablet 1 tab by mouth once daily x 3 days, then increase to twice daily 60 tablet 0   No current facility-administered medications on file prior to visit.     BP (!) 143/79 (BP Location: Left Arm, Patient Position: Sitting, Cuff Size: Large)   Pulse 61   Temp 98.1 F (36.7 C) (Oral)   Resp 17   Ht 5\' 9"  (1.753 m)   Wt 265 lb 6.4 oz (120.4 kg)   SpO2 100%   BMI 39.19 kg/m    Objective:   Physical Exam  Constitutional: He is oriented to person, place,  and time. He appears well-developed and well-nourished. No distress.  Neurological: He is alert and oriented to person, place, and time.  Psychiatric: He has a normal mood and affect. His behavior is normal. Judgment and thought content normal.          Assessment & Plan:  Flu shot today

## 2015-10-27 NOTE — Assessment & Plan Note (Signed)
Discussed diet, exercise, weight loss. Plan follow up in 3 months. If still elevated at that time, consider addition of antihypertensive.

## 2015-10-27 NOTE — Patient Instructions (Signed)
Work on diet/exercise and weight loss. (low sodium diet) Follow up in 3 months.,

## 2015-11-16 NOTE — Telephone Encounter (Signed)
Pt scheduled appt and seen on 10/27/15.

## 2016-01-26 ENCOUNTER — Ambulatory Visit: Payer: BLUE CROSS/BLUE SHIELD | Admitting: Family

## 2016-08-05 ENCOUNTER — Encounter (HOSPITAL_COMMUNITY): Payer: Self-pay | Admitting: Emergency Medicine

## 2016-08-05 ENCOUNTER — Ambulatory Visit (HOSPITAL_COMMUNITY)
Admission: EM | Admit: 2016-08-05 | Discharge: 2016-08-05 | Disposition: A | Discharge status: Home / Self Care | Payer: BLUE CROSS/BLUE SHIELD | Attending: Family Medicine | Admitting: Family Medicine

## 2016-08-05 DIAGNOSIS — R1032 Left lower quadrant pain: Secondary | ICD-10-CM | POA: Diagnosis not present

## 2016-08-05 MED ORDER — METRONIDAZOLE 500 MG PO TABS: 500.0000 mg | ORAL_TABLET | Freq: Three times a day (TID) | ORAL | 0 refills | Status: DC

## 2016-08-05 MED ORDER — CIPROFLOXACIN HCL 500 MG PO TABS: 500.0000 mg | ORAL_TABLET | Freq: Two times a day (BID) | ORAL | 0 refills | Status: DC

## 2016-08-05 NOTE — ED Triage Notes (Signed)
Pt c/o left flank pain onset 1 week... sts it was intermittent but has become more constant  Pain increases w/pressure  Denies fevers, chills, urinary sx, n/v/d  LBM = today (no abnormalities noted)  A&O x4... NAD... Ambulatory

## 2016-08-07 ENCOUNTER — Emergency Department (HOSPITAL_COMMUNITY): Payer: BLUE CROSS/BLUE SHIELD

## 2016-08-07 ENCOUNTER — Encounter (HOSPITAL_COMMUNITY): Payer: Self-pay

## 2016-08-07 ENCOUNTER — Emergency Department (HOSPITAL_COMMUNITY)
Admission: EM | Admit: 2016-08-07 | Discharge: 2016-08-07 | Disposition: A | Discharge status: Home / Self Care | Payer: BLUE CROSS/BLUE SHIELD | Attending: Emergency Medicine | Admitting: Emergency Medicine

## 2016-08-07 DIAGNOSIS — R1032 Left lower quadrant pain: Secondary | ICD-10-CM | POA: Diagnosis present

## 2016-08-07 DIAGNOSIS — I10 Essential (primary) hypertension: Secondary | ICD-10-CM | POA: Diagnosis not present

## 2016-08-07 DIAGNOSIS — K6389 Other specified diseases of intestine: Secondary | ICD-10-CM

## 2016-08-07 DIAGNOSIS — Z79899 Other long term (current) drug therapy: Secondary | ICD-10-CM | POA: Insufficient documentation

## 2016-08-07 DIAGNOSIS — Q438 Other specified congenital malformations of intestine: Secondary | ICD-10-CM | POA: Insufficient documentation

## 2016-08-07 DIAGNOSIS — K529 Noninfective gastroenteritis and colitis, unspecified: Secondary | ICD-10-CM

## 2016-08-07 LAB — COMPREHENSIVE METABOLIC PANEL
ALBUMIN: 3.8 g/dL (ref 3.5–5.0)
ALK PHOS: 75 U/L (ref 38–126)
ALT: 31 U/L (ref 17–63)
AST: 19 U/L (ref 15–41)
Anion gap: 8 (ref 5–15)
BUN: 14 mg/dL (ref 6–20)
CALCIUM: 9 mg/dL (ref 8.9–10.3)
CO2: 26 mmol/L (ref 22–32)
CREATININE: 0.96 mg/dL (ref 0.61–1.24)
Chloride: 106 mmol/L (ref 101–111)
GFR calc Af Amer: >60 mL/min (ref >60)
GFR calc non Af Amer: >60 mL/min (ref >60)
GLUCOSE: 120 mg/dL — AB (ref 65–99)
Potassium: 4.4 mmol/L (ref 3.5–5.1)
SODIUM: 140 mmol/L (ref 135–145)
Total Bilirubin: 0.3 mg/dL (ref 0.3–1.2)
Total Protein: 7.2 g/dL (ref 6.5–8.1)

## 2016-08-07 LAB — CBC
HCT: 39.6 % (ref 39.0–52.0)
Hemoglobin: 13.5 g/dL (ref 13.0–17.0)
MCH: 29.5 pg (ref 26.0–34.0)
MCHC: 34.1 g/dL (ref 30.0–36.0)
MCV: 86.7 fL (ref 78.0–100.0)
PLATELETS: 264 10*3/uL (ref 150–400)
RBC: 4.57 MIL/uL (ref 4.22–5.81)
RDW: 13.2 % (ref 11.5–15.5)
WBC: 7.7 10*3/uL (ref 4.0–10.5)

## 2016-08-07 LAB — URINALYSIS, ROUTINE W REFLEX MICROSCOPIC
BACTERIA UA: NONE SEEN
Bilirubin Urine: NEGATIVE
Glucose, UA: NEGATIVE mg/dL
Hgb urine dipstick: NEGATIVE
KETONES UR: NEGATIVE mg/dL
Nitrite: NEGATIVE
PH: 5 (ref 5.0–8.0)
Protein, ur: NEGATIVE mg/dL
SQUAMOUS EPITHELIAL / LPF: NONE SEEN
Specific Gravity, Urine: 1.02 (ref 1.005–1.030)

## 2016-08-07 LAB — LIPASE, BLOOD: Lipase: 21 U/L (ref 11–51)

## 2016-08-07 MED ORDER — IOPAMIDOL (ISOVUE-300) INJECTION 61%
100.0000 mL | Freq: Once | INTRAVENOUS | Status: AC | PRN
  Administered 2016-08-07: 100 mL via INTRAVENOUS

## 2016-08-07 MED ORDER — IOPAMIDOL (ISOVUE-300) INJECTION 61%
INTRAVENOUS | Status: AC
  Administered 2016-08-07: 100 mL via INTRAVENOUS
  Filled 2016-08-07: qty 100

## 2016-08-07 NOTE — ED Provider Notes (Signed)
[facility]-EMERGENCY DEPT Provider Note   CSN: [id number] Arrival date & time: 08/07/16  0818     History   Chief Complaint Chief Complaint  Patient presents with  . Flank Pain    Left    HPI Devin [name] is a 46 y.o. male.  Patient with past medical history of left inguinal hernia and hypertension, presents with worsening left lower quadrant abdominal pain since Saturday. Patient states pain is an intermittent for 3-4 weeks, however began worsening on Saturday. Patient reported urgent care on Saturday, diagnosed with possible diverticulitis and treated with Flagyl and Cipro. Patient states pain is continuing to worsen and not improving, 6-10, sharp, nonradiating, worse with lying on left side. Reports last bowel movement was this morning's, small and hard. States his bowel movements are normally regular every morning. States his left inguinal hernia has not been causing him problems, it is not currently swollen or painful. Patient denies nausea, vomiting, urinary symptoms, chest pain, shortness of breath, fever. Denies history of kidney stones or diverticulosis.       Past Medical History:  Diagnosis Date  . History of MRSA infection   . Hypertension    controlled  . Keratoconus of both eyes     Patient Active Problem List   Diagnosis Date Noted  . Elevated blood pressure 10/27/2015  . Atypical chest pain 12/27/2014  . Morbid obesity (HCC) 09/18/2014  . Erectile dysfunction 03/11/2014  . Neck pain 02/24/2014  . Resting tremor 10/29/2013  . Weight gain 12/21/2011  . Depression with anxiety 04/29/2010  . KERATOCONUS 04/29/2010  . CARDIOMEGALY 03/25/2010  . HTN (hypertension) 12/31/2006  . HEART MURMUR, HX OF 12/31/2006    Past Surgical History:  Procedure Laterality Date  . NECK SURGERY  02/03/14       Home Medications    Prior to Admission medications   Medication Sig Start Date End Date Taking? Authorizing Provider  ciprofloxacin (CIPRO) 500 MG tablet Take  1 tablet (500 mg total) by mouth 2 (two) times daily. 08/05/16  Yes Hagler, Arlys John, MD  ibuprofen (ADVIL,MOTRIN) 200 MG tablet Take 400-800 mg by mouth every 6 (six) hours as needed for mild pain or moderate pain.   Yes [provider]  metroNIDAZOLE (FLAGYL) 500 MG tablet Take 1 tablet (500 mg total) by mouth 3 (three) times daily. 08/05/16  Yes Hagler, Arlys John, MD  ALPRAZolam Prudy Feeler) 0.25 MG tablet Take 1 tablet (0.25 mg total) by mouth 2 (two) times daily as needed for anxiety. Patient not taking: Reported on 08/07/2016 05/26/15   Sandford Craze, NP  buPROPion (WELLBUTRIN XL) 300 MG 24 hr tablet Take 1 tablet (300 mg total) by mouth daily. Patient not taking: Reported on 08/07/2016 10/27/15   Sandford Craze, NP  valACYclovir (VALTREX) 1000 MG tablet 2 tabs PO now, then 2 tabs PO in 12 hrs as needed for cold sores Patient not taking: Reported on 08/07/2016 02/02/15   Sandford Craze, NP    Family History Family History  Problem Relation Age of Onset  . Diabetes Mother   . Hyperlipidemia Mother   . Heart disease Mother        CHF  . Thyroid disease Mother        Graves Disease  . Hypertension Father   . Asthma Daughter   . ADD / ADHD Daughter     Social History Social History  Substance Use Topics  . Smoking status: Never Smoker  . Smokeless tobacco: Never Used  . Alcohol use No  Allergies   Penicillins and Shellfish allergy   Review of Systems Review of Systems  Constitutional: Negative for fever.  HENT: Negative for trouble swallowing.   Respiratory: Negative for shortness of breath.   Cardiovascular: Negative for chest pain.  Gastrointestinal: Positive for abdominal pain (LLQ). Negative for blood in stool, constipation, diarrhea, nausea and vomiting.  Genitourinary: Negative for dysuria, hematuria and scrotal swelling.  Musculoskeletal: Negative for back pain.  Skin: Negative for color change.  Allergic/Immunologic: Negative for immunocompromised  state.  Neurological: Negative for headaches.     Physical Exam Updated Vital Signs BP (!) 145/90 (BP Location: Left Arm)   Pulse (!) 50   Temp 97.6 F (36.4 C) (Oral)   Resp 16   SpO2 100%   Physical Exam  Constitutional: He appears well-developed and well-nourished. No distress.  Pt is well-appearing. Pt is obese  HENT:  Head: Normocephalic and atraumatic.  Mouth/Throat: Oropharynx is clear and moist.  Eyes: Conjunctivae are normal.  Cardiovascular: Regular rhythm, normal heart sounds and intact distal pulses.  Exam reveals no friction rub.   No murmur heard. Slightly bradycardic  Pulmonary/Chest: Effort normal and breath sounds normal. No respiratory distress. He has no wheezes. He has no rales.  Abdominal: Soft. Normal appearance and bowel sounds are normal. He exhibits no distension and no mass. There is tenderness in the left lower quadrant. There is no rigidity, no rebound, no guarding, no CVA tenderness and no tenderness at McBurney's point.  Neurological: He is alert.  Skin: Skin is warm.  Psychiatric: He has a normal mood and affect. His behavior is normal.  Nursing note and vitals reviewed.    ED Treatments / Results  Labs (all labs ordered are listed, but only abnormal results are displayed) Labs Reviewed  URINALYSIS, ROUTINE W REFLEX MICROSCOPIC - Abnormal; Notable for the following:       Result Value   Leukocytes, UA SMALL (*)    All other components within normal limits  COMPREHENSIVE METABOLIC PANEL - Abnormal; Notable for the following:    Glucose, Bld 120 (*)    All other components within normal limits  LIPASE, BLOOD  CBC    EKG  EKG Interpretation None       Radiology Ct Abdomen Pelvis W Contrast  Result Date: 08/07/2016 CLINICAL DATA:  LEFT-sided flank pain. EXAM: CT ABDOMEN AND PELVIS WITH CONTRAST TECHNIQUE: Multidetector CT imaging of the abdomen and pelvis was performed using the standard protocol following bolus administration of  intravenous contrast. CONTRAST:  100 mL Isovue-300 COMPARISON:  None. FINDINGS: Lower chest: Lung bases are clear. Hepatobiliary: No focal hepatic lesion. No biliary duct dilatation. Gallbladder is normal. Common bile duct is normal. Pancreas: Pancreas is normal. No ductal dilatation. No pancreatic inflammation. Spleen: Normal spleen Adrenals/urinary tract: Adrenal glands normal. Kidneys, ureters and bladder normal. Low-density lesion in the RIGHT renal cortex most consistent benign cysts. Stomach/Bowel: Stomach, small bowel, appendix, and cecum are normal. Along the descending colon there is fat inflammation along the anti mesenteric border in an ovoid pattern measuring 3.4 cm (image 58, series 2). Typical pattern of epiploic appendagitis. Minimal diverticular disease. Vascular/Lymphatic: Abdominal aorta is normal caliber. There is no retroperitoneal or periportal lymphadenopathy. No pelvic lymphadenopathy. Reproductive: Prostate normal Other: No free fluid. Musculoskeletal: No aggressive osseous lesion. IMPRESSION: 1. Epiploic appendagitis of the descending colon. This is a benign self-limiting process which can be painful. 2. No additional acute findings in the abdomen or pelvis. Electronically Signed   By: Loura Halt.D.  On: 08/07/2016 10:56    Procedures Procedures (including critical care time)  Medications Ordered in ED Medications  iopamidol (ISOVUE-300) 61 % injection 100 mL (100 mLs Intravenous Contrast Given 08/07/16 1033)     Initial Impression / Assessment and Plan / ED Course  I have reviewed the triage vital signs and the nursing notes.  Pertinent labs & imaging results that were available during my care of the patient were reviewed by me and considered in my medical decision making (see chart for details).     Patient presenting with left lower quadrant abdominal pain. CT showing epiploic appendagitis to distal colon; no evidence of diverticulitis or other acute pathology.  Vital signs stable, afebrile, labs reassuring, not in distress. Provided patient with reassurance. Advil for pain until symptoms improve. Patient is safe for discharge home.  Patient discussed with Dr. Patria Maneampos, who agrees w care plan.  Discussed results, findings, treatment and follow up. Patient advised of return precautions. Patient verbalized understanding and agreed with plan.    Final Clinical Impressions(s) / ED Diagnoses   Final diagnoses:  Epiploic appendagitis    New Prescriptions Discharge Medication List as of 08/07/2016 11:37 AM       Russo, Swazil[place]Jordan N, PA-C 08/07/16 1649    Azalia Bilisampos, Kevin, MD 08/07/16 2152

## 2016-08-07 NOTE — ED Triage Notes (Signed)
Pt reports 6/10 left sided flank pain that radiates into his left lower abd. Pt [facility] and Rx'd abx. Pt denies urinary s/s, and hx of kidney stones.

## 2016-08-07 NOTE — ED Notes (Signed)
Patient transported to CT 

## 2016-08-07 NOTE — Discharge Instructions (Signed)
Begin taking 600 mg of Advil/Ibuprofen every 6 hours with food until symptoms improve. Your pain should begin improving in about 3-5 days. Follow up with your primary care provider as needed. Return to the ER for new or concerning symptoms.

## 2016-08-07 NOTE — ED Provider Notes (Signed)
[id number] 08/05/16 Arrival Time: 1733  ASSESSMENT & PLAN:  Abdominal pain, left lower quadrant Possibly diverticulitis.  Overall he looks fairly well. With tenderness of LLQ and otherwise benign abdominal exam will treat empirically for diverticulitis. Ensure adequate fluid intake. Close monitoring of symptoms.   Reviewed expectations re: course of current medical issues.  Discussed self-management of symptoms.  Outlined signs and symptoms indicating need for more acute intervention. Follow up here or Emergency Department if worsening.  Patient verbalized understanding. Questions answered.  After Visit Summary given.   Meds ordered this encounter  Medications  . ciprofloxacin (CIPRO) 500 MG tablet    Sig: Take 1 tablet (500 mg total) by mouth 2 (two) times daily.    Dispense:  20 tablet    Refill:  0  . metroNIDAZOLE (FLAGYL) 500 MG tablet    Sig: Take 1 tablet (500 mg total) by mouth 3 (three) times daily.    Dispense:  30 tablet    Refill:  0     SUBJECTIVE:  Devin [name] is a 46 y.o. male who presents with complaint of lower abdominal discomfort for the past 1-2 weeks. Gradual onset. On and off. Never limits him. Afebrile. No n/v/d. No testicular pain or swelling. No surgical history. Describes discomfort as dull but only when he pushes on that area. No skin changes or rashes. No OTC meds. No injuries. No urinary difficulties or hematuria. Normal PO intake. Ambulatory without problem. No LE edema. No back or flank pain.  ROS: As per HPI.  OBJECTIVE:  Vitals:   08/05/16 1810  BP: 135/75  Pulse: 71  Resp: 16  Temp: 98.5 F (36.9 C)  TempSrc: Oral  SpO2: 100%     General appearance: alert, cooperative, appears stated age and no distress Back: symmetric, no curvature. ROM normal. No CVA tenderness. Lungs: clear to auscultation bilaterally Chest wall: no tenderness Heart: regular rate and rhythm Abdomen: soft with mild tenderness to palpation over LLQ;  bowel sounds normal; no masses,  no organomegaly; no guarding or rebound tenderness Extremities: extremities normal, atraumatic, no cyanosis or edema Skin: Skin color, texture, turgor normal. No rashes or lesions Lymph nodes: no lymphadenopathy Neurologic:  Normal gait    Allergies  Allergen Reactions  . Penicillins     REACTION: causes rash and sob.  . Shellfish Allergy     shrimp    PMHx, SurgHx, SocialHx, Medications, and Allergies were reviewed in the Visit Navigator and updated as appropriate.       Mardella LaymanHagler, Marck Mcclenny, MD 08/07/16 [phone]

## 2018-01-08 DIAGNOSIS — H18603 Keratoconus, unspecified, bilateral: Secondary | ICD-10-CM | POA: Diagnosis not present

## 2018-01-08 DIAGNOSIS — Z880 Allergy status to penicillin: Secondary | ICD-10-CM | POA: Diagnosis not present

## 2018-06-14 DIAGNOSIS — M542 Cervicalgia: Secondary | ICD-10-CM | POA: Diagnosis not present

## 2019-01-12 DIAGNOSIS — S2232XA Fracture of one rib, left side, initial encounter for closed fracture: Secondary | ICD-10-CM | POA: Diagnosis not present

## 2019-01-12 DIAGNOSIS — R0781 Pleurodynia: Secondary | ICD-10-CM | POA: Diagnosis not present

## 2019-01-18 DIAGNOSIS — R0602 Shortness of breath: Secondary | ICD-10-CM | POA: Diagnosis not present

## 2019-01-18 DIAGNOSIS — Z981 Arthrodesis status: Secondary | ICD-10-CM | POA: Diagnosis not present

## 2019-01-18 DIAGNOSIS — S2232XA Fracture of one rib, left side, initial encounter for closed fracture: Secondary | ICD-10-CM | POA: Diagnosis not present

## 2019-02-05 DIAGNOSIS — S2232XA Fracture of one rib, left side, initial encounter for closed fracture: Secondary | ICD-10-CM | POA: Diagnosis not present

## 2019-02-27 DIAGNOSIS — Z20822 Contact with and (suspected) exposure to covid-19: Secondary | ICD-10-CM | POA: Diagnosis not present

## 2019-03-14 DIAGNOSIS — Z20822 Contact with and (suspected) exposure to covid-19: Secondary | ICD-10-CM | POA: Diagnosis not present

## 2019-04-18 DIAGNOSIS — H5211 Myopia, right eye: Secondary | ICD-10-CM | POA: Diagnosis not present

## 2019-04-18 DIAGNOSIS — H18603 Keratoconus, unspecified, bilateral: Secondary | ICD-10-CM | POA: Diagnosis not present

## 2019-05-21 ENCOUNTER — Other Ambulatory Visit: Payer: Self-pay

## 2019-05-22 ENCOUNTER — Ambulatory Visit: Payer: BC Managed Care – PPO | Admitting: Family Medicine

## 2019-05-22 ENCOUNTER — Encounter: Payer: Self-pay | Admitting: Family Medicine

## 2019-05-22 ENCOUNTER — Other Ambulatory Visit: Payer: Self-pay

## 2019-05-22 VITALS — BP 132/88 | HR 56 | Temp 95.3°F | Ht 70.0 in | Wt 222.0 lb

## 2019-05-22 DIAGNOSIS — R42 Dizziness and giddiness: Secondary | ICD-10-CM | POA: Diagnosis not present

## 2019-05-22 DIAGNOSIS — G4700 Insomnia, unspecified: Secondary | ICD-10-CM | POA: Diagnosis not present

## 2019-05-22 LAB — COMPREHENSIVE METABOLIC PANEL
ALT: 14 U/L (ref 0–53)
AST: 12 U/L (ref 0–37)
Albumin: 4.7 g/dL (ref 3.5–5.2)
Alkaline Phosphatase: 65 U/L (ref 39–117)
BUN: 17 mg/dL (ref 6–23)
CO2: 28 mEq/L (ref 19–32)
Calcium: 9.5 mg/dL (ref 8.4–10.5)
Chloride: 101 mEq/L (ref 96–112)
Creatinine, Ser: 0.93 mg/dL (ref 0.40–1.50)
GFR: 87.33 mL/min (ref >60.00)
Glucose, Bld: 77 mg/dL (ref 70–99)
Potassium: 4.9 mEq/L (ref 3.5–5.1)
Sodium: 136 mEq/L (ref 135–145)
Total Bilirubin: 1 mg/dL (ref 0.2–1.2)
Total Protein: 7.1 g/dL (ref 6.0–8.3)

## 2019-05-22 LAB — CBC
HCT: 40.8 % (ref 39.0–52.0)
Hemoglobin: 14 g/dL (ref 13.0–17.0)
MCHC: 34.4 g/dL (ref 30.0–36.0)
MCV: 89.3 fl (ref 78.0–100.0)
Platelets: 246 10*3/uL (ref 150.0–400.0)
RBC: 4.58 Mil/uL (ref 4.22–5.81)
RDW: 13.3 % (ref 11.5–15.5)
WBC: 6.9 10*3/uL (ref 4.0–10.5)

## 2019-05-22 LAB — TSH: TSH: 2.37 u[IU]/mL (ref 0.35–4.50)

## 2019-05-22 MED ORDER — TRAZODONE HCL 50 MG PO TABS: 25.0000 mg | ORAL_TABLET | Freq: Every evening | ORAL | 3 refills | Status: DC | PRN

## 2019-05-22 NOTE — Progress Notes (Signed)
Chief Complaint  Patient presents with  . Dizziness    Devin [name] is 49 y.o. pt here for dizziness.  He is here with his wife.  Duration: 6 months Pass out? Maybe passed out on 1 occasion, nothing recently Happens when he rises from a seated or laying position. Spinning? No Recent illness/fever? No Headache? No Neurologic signs? No Change in PO intake? Yes He takes no medications routinely. He does report his heart rate will drop to the low 40s while he is sleeping.  He is not having any symptoms correlating with his lightheadedness.  Sleep issues waking up at 2 AM over past year.  He usually goes to bed around 9 PM and has no difficulty falling asleep.  His mind will race around 2 AM.  He is not urinating.  He does not feel well rested.  He does not drink caffeine beyond 12 PM.  He does not take naps.  He does not drink copious amounts of alcohol.  Past Medical History:  Diagnosis Date  . History of MRSA infection   . Hypertension    controlled  . Keratoconus of both eyes     Family History  Problem Relation Age of Onset  . Diabetes Mother   . Hyperlipidemia Mother   . Heart disease Mother        CHF  . Thyroid disease Mother        Graves Disease  . Hypertension Father   . Asthma Daughter   . ADD / ADHD Daughter     BP 132/88 (BP Location: Left Arm, Patient Position: Sitting, Cuff Size: Normal)   Pulse (!) 56   Temp (!) 95.3 F (35.2 C) (Temporal)   Ht 5\' 10"  (1.778 m)   Wt 222 lb (100.7 kg)   SpO2 98%   BMI 31.85 kg/m  General: Awake, alert, appears stated age Eyes: PERRLA, EOMi Ears: Patent, TM's neg b/l Heart: RRR, no murmurs, no carotid bruits Lungs: CTAB, no accessory muscle use MSK: 5/5 strength throughout, gait normal Neuro: No cerebellar signs, patellar reflex 2/4 b/l wo clonus, calcaneal reflex 1/4 b/l wo clonus, biceps reflex 1/4 b/l wo clonus; Dix-Hall-Pike negative bilaterally. Psych: Age appropriate judgment and insight, normal mood and  affect  Light-headedness - Plan: CBC, Comprehensive metabolic panel, TSH  Insomnia, unspecified type - Plan: traZODone (DESYREL) 50 MG tablet  1-I think this is likely due to hypovolemia.  He should probably increase his water intake by 16-20 ounces daily.  He does drink a lot of coffee and is probably diuresing himself slightly.  Will check labs also. 2-behavioral health information provided.  Sleep hygiene information verbalized and written down.  Will trial trazodone.  I will see him in 1 month and if no improvement, will start treatment for anxiety. Pt and his wife voiced understanding and agreement to the plan.  [place], DO 05/22/19 12:00 PM

## 2019-05-22 NOTE — Patient Instructions (Addendum)
Give [name] 2-3 business days to get the results of your labs back.   Sleep is important to [name] all. Getting good sleep is imperative to adequate functioning during the day. Work with our counselors who are trained to help people obtain quality sleep. Call [phone] to schedule an appointment or if you are curious about insurance coverage/cost.  Sleep Hygiene Tips:  Do not watch TV or look at screens within 1 hour of going to bed. If you do, make sure there is a blue light filter (nighttime mode) involved.  Try to go to bed around the same time every night. Wake up at the same time within 1 hour of regular time. Ex: If you wake up at 7 AM for work, do not sleep past 8 AM on days that you don't work.  Do not drink alcohol before bedtime.  Do not consume caffeine-containing beverages after noon or within 9 hours of intended bedtime.  Get regular exercise/physical activity in your life, but not within 2 hours of planned bedtime.  Do not take naps.   Do not eat within 2 hours of planned bedtime.  Melatonin, 3-5 mg 30-60 minutes before planned bedtime may be helpful.   The bed should be for sleep or sex only. If after 20-30 minutes you are unable to fall asleep, get up and do something relaxing. Do this until you feel ready to go to sleep again.   Keep the diet clean and stay active.

## 2019-06-27 ENCOUNTER — Ambulatory Visit: Payer: BC Managed Care – PPO | Admitting: Family

## 2019-06-27 ENCOUNTER — Ambulatory Visit: Payer: BC Managed Care – PPO | Admitting: Family Medicine

## 2019-07-18 DIAGNOSIS — G5600 Carpal tunnel syndrome, unspecified upper limb: Secondary | ICD-10-CM | POA: Diagnosis not present

## 2019-07-18 DIAGNOSIS — G250 Essential tremor: Secondary | ICD-10-CM | POA: Diagnosis not present

## 2019-07-30 ENCOUNTER — Encounter (HOSPITAL_BASED_OUTPATIENT_CLINIC_OR_DEPARTMENT_OTHER): Payer: Self-pay

## 2019-07-30 ENCOUNTER — Other Ambulatory Visit: Payer: Self-pay

## 2019-07-30 ENCOUNTER — Emergency Department (HOSPITAL_BASED_OUTPATIENT_CLINIC_OR_DEPARTMENT_OTHER)
Admission: EM | Admit: 2019-07-30 | Discharge: 2019-07-31 | Disposition: A | Discharge status: Home / Self Care | Payer: BC Managed Care – PPO | Attending: Emergency Medicine | Admitting: Emergency Medicine

## 2019-07-30 ENCOUNTER — Emergency Department (HOSPITAL_BASED_OUTPATIENT_CLINIC_OR_DEPARTMENT_OTHER): Payer: BC Managed Care – PPO

## 2019-07-30 DIAGNOSIS — I480 Paroxysmal atrial fibrillation: Secondary | ICD-10-CM | POA: Diagnosis not present

## 2019-07-30 DIAGNOSIS — I10 Essential (primary) hypertension: Secondary | ICD-10-CM | POA: Diagnosis not present

## 2019-07-30 DIAGNOSIS — R0789 Other chest pain: Secondary | ICD-10-CM | POA: Diagnosis not present

## 2019-07-30 DIAGNOSIS — J939 Pneumothorax, unspecified: Secondary | ICD-10-CM | POA: Diagnosis not present

## 2019-07-30 DIAGNOSIS — R079 Chest pain, unspecified: Secondary | ICD-10-CM | POA: Diagnosis not present

## 2019-07-30 LAB — CBC
HCT: 43 % (ref 39.0–52.0)
Hemoglobin: 14.5 g/dL (ref 13.0–17.0)
MCH: 30.6 pg (ref 26.0–34.0)
MCHC: 33.7 g/dL (ref 30.0–36.0)
MCV: 90.7 fL (ref 80.0–100.0)
Platelets: 277 10*3/uL (ref 150–400)
RBC: 4.74 MIL/uL (ref 4.22–5.81)
RDW: 13 % (ref 11.5–15.5)
WBC: 7.5 10*3/uL (ref 4.0–10.5)
nRBC: 0 % (ref 0.0–0.2)

## 2019-07-30 LAB — BASIC METABOLIC PANEL
Anion gap: 10 (ref 5–15)
BUN: 19 mg/dL (ref 6–20)
CO2: 24 mmol/L (ref 22–32)
Calcium: 9 mg/dL (ref 8.9–10.3)
Chloride: 104 mmol/L (ref 98–111)
Creatinine, Ser: 0.99 mg/dL (ref 0.61–1.24)
GFR calc Af Amer: >60 mL/min (ref >60)
GFR calc non Af Amer: >60 mL/min (ref >60)
Glucose, Bld: 93 mg/dL (ref 70–99)
Potassium: 3.6 mmol/L (ref 3.5–5.1)
Sodium: 138 mmol/L (ref 135–145)

## 2019-07-30 LAB — TROPONIN I (HIGH SENSITIVITY): Troponin I (High Sensitivity): 5 ng/L (ref <18)

## 2019-07-30 MED ORDER — SODIUM CHLORIDE 0.9% FLUSH
3.0000 mL | Freq: Once | INTRAVENOUS | Status: DC
  Filled 2019-07-30: qty 3

## 2019-07-30 NOTE — ED Triage Notes (Signed)
Pt c/o CP x 2 hours-states feels heart is racing-NAD-steady gait

## 2019-07-30 NOTE — ED Notes (Signed)
Pt ambulated to to XR from waiting room without distress

## 2019-07-30 NOTE — ED Provider Notes (Signed)
[facility] EMERGENCY DEPARTMENT Provider Note   CSN: [id number] Arrival date & time: 07/30/19  2143     History Chief Complaint  Patient presents with  . Chest Pain    Devin [name] is a 49 y.o. male.  HPI      Heart started racing began suddenly while watching TV Apple watch read elevated heart rate, atrial fibrillation Chest felt strange sensation, something weird, chest with rapid heart rate, fluttering No shortness of breath, no pain  No nausea or vomiting   Also notes on and off over last month Hand tingling, thinks has pinched nerve had 2 vertebrae in neck Approx one month ago began to have intermittent numbness/tingling, comes and goes, doesn't notice it much, just comes and goes . Saw [facility] about it  Denies numbness, weakness, difficulty talking or walking, visual changes or facial droop.    No new medicines  No smoking, no other drugs occ etoh  Htn not on medications-told borderline didn't need it  No bleeding history, no black or bloody stools  Past Medical History:  Diagnosis Date  . History of MRSA infection   . Hypertension    controlled  . Keratoconus of both eyes     Patient Active Problem List   Diagnosis Date Noted  . Insomnia 05/22/2019  . Elevated blood pressure 10/27/2015  . Atypical chest pain 12/27/2014  . Morbid obesity (HCC) 09/18/2014  . Erectile dysfunction 03/11/2014  . Neck pain 02/24/2014  . Resting tremor 10/29/2013  . Weight gain 12/21/2011  . Depression with anxiety 04/29/2010  . KERATOCONUS 04/29/2010  . CARDIOMEGALY 03/25/2010  . HTN (hypertension) 12/31/2006  . HEART MURMUR, HX OF 12/31/2006    Past Surgical History:  Procedure Laterality Date  . NECK SURGERY  02/03/14       Family History  Problem Relation Age of Onset  . Diabetes Mother   . Hyperlipidemia Mother   . Heart disease Mother        CHF  . Thyroid disease Mother        Graves Disease  . Hypertension Father   . Asthma Daughter    . ADD / ADHD Daughter     Social History   Tobacco Use  . Smoking status: Never Smoker  . Smokeless tobacco: Never Used  Vaping Use  . Vaping Use: Never used  Substance Use Topics  . Alcohol use: Yes    Comment: weekly  . Drug use: Never    Home Medications Prior to Admission medications   Medication Sig Start Date End Date Taking? Authorizing Provider  apixaban (ELIQUIS) 5 MG TABS tablet Take 1 tablet (5 mg total) by mouth 2 (two) times daily. 07/31/19 08/30/19  Alvira Monday, MD  traZODone (DESYREL) 50 MG tablet Take 0.5-1 tablets (25-50 mg total) by mouth at bedtime as needed for sleep. 05/22/19   Sharlene Dory, DO    Allergies    Penicillins and Shellfish allergy  Review of Systems   Review of Systems  Constitutional: Negative for fever.  HENT: Negative for sore throat.   Eyes: Negative for visual disturbance.  Respiratory: Negative for cough and shortness of breath.   Cardiovascular: Positive for palpitations. Negative for chest pain and leg swelling.  Gastrointestinal: Negative for abdominal pain and nausea.  Genitourinary: Negative for difficulty urinating.  Musculoskeletal: Negative for back pain and neck stiffness.  Skin: Negative for rash.  Neurological: Negative for syncope and headaches. Numbness: tingling left hand and arm intermittently over the last  monght.    Physical Exam Updated Vital Signs BP 118/68 (BP Location: Left Arm)   Pulse 62   Temp 98 F (36.7 C) (Oral)   Resp 14   Ht 5\' 9"  (1.753 m)   Wt 103 kg   SpO2 100%   BMI 33.52 kg/m   Physical Exam Vitals and nursing note reviewed.  Constitutional:      General: He is not in acute distress.    Appearance: Normal appearance. He is well-developed. He is not ill-appearing or diaphoretic.  HENT:     Head: Normocephalic and atraumatic.  Eyes:     General: No visual field deficit.    Extraocular Movements: Extraocular movements intact.     Conjunctiva/sclera: Conjunctivae normal.      Pupils: Pupils are equal, round, and reactive to light.  Cardiovascular:     Rate and Rhythm: Normal rate and regular rhythm.     Pulses: Normal pulses.     Heart sounds: Normal heart sounds. No murmur heard.  No friction rub. No gallop.   Pulmonary:     Effort: Pulmonary effort is normal. No respiratory distress.     Breath sounds: Normal breath sounds. No wheezing or rales.  Abdominal:     General: There is no distension.     Palpations: Abdomen is soft.     Tenderness: There is no abdominal tenderness. There is no guarding.  Musculoskeletal:        General: No swelling or tenderness.     Cervical back: Normal range of motion.  Skin:    General: Skin is warm and dry.     Findings: No erythema or rash.  Neurological:     General: No focal deficit present.     Mental Status: He is alert and oriented to person, place, and time.     GCS: GCS eye subscore is 4. GCS verbal subscore is 5. GCS motor subscore is 6.     Cranial Nerves: No cranial nerve deficit, dysarthria or facial asymmetry.     Sensory: No sensory deficit.     Motor: No weakness or tremor.     Coordination: Coordination normal. Finger-Nose-Finger Test normal.     Gait: Gait normal.     ED Results / Procedures / Treatments   Labs (all labs ordered are listed, but only abnormal results are displayed) Labs Reviewed  BASIC METABOLIC PANEL  CBC  TROPONIN I (HIGH SENSITIVITY)  TROPONIN I (HIGH SENSITIVITY)    EKG EKG Interpretation  Date/Time:  Wednesday July 30 2019 21:43:00 EDT Ventricular Rate:  122 PR Interval:    QRS Duration: 90 QT Interval:  304 QTC Calculation: 433 R Axis:   -4 Text Interpretation: Atrial flutter with variable A-V block with premature ventricular or aberrantly conducted complexes Nonspecific ST and T wave abnormality Abnormal ECG No previous ECGs available Confirmed by 05-30-2003 (Alvira Monday) on 07/31/2019 12:01:53 AM   Radiology DG Chest 2 View  Result Date:  07/30/2019 CLINICAL DATA:  Chest pain for 2 hours. EXAM: CHEST - 2 VIEW COMPARISON:  04/14/2014 FINDINGS: The cardiomediastinal contours are normal. The lungs are clear. Pulmonary vasculature is normal. No consolidation, pleural effusion, or pneumothorax. No acute osseous abnormalities are seen. Surgical hardware in the lower cervical spine, partially included. IMPRESSION: No acute chest findings. Electronically Signed   By: 04/16/2014 M.D.   On: 07/30/2019 22:01    Procedures Procedures (including critical care time)  Medications Ordered in ED Medications - No data to display  ED  Course  I have reviewed the triage vital signs and the nursing notes.  Pertinent labs & imaging results that were available during my care of the patient were reviewed by me and considered in my medical decision making (see chart for details).    MDM Rules/Calculators/A&P                      49yo male with history of borderline hypertension (not on medications) presents with palpitations.  HR on arrival 120, irregular, not sinus rhythm, appearance of possible flutter waves and suspect atrial flutter with variable block or atrial fibrillation.  On my evaluation, he has returned to a normal sinus rhythm with decreased rate and is asymptomatic.  Suspect symptoms had previously were due to arrhythmia.  Doubt ACS, PE. No significant electrolyte abnormalities.    Does discuss intermittent tingling occurring over the last month to the left side.  Given frequency of symptoms over the last month I do not suspect TIA. No neuro deficits at this time to suggest CVA.  Has seen [facility] regarding symptoms.  At this time do not feel emergent neurologic work up indicated but that continued follow up with PCP appropriate.    CHADSVASc score is 1 if we account for possible htn history.  Will start eliquis for now and have him follow up with atrial fibrillation clinic. Given HR has history of being low at baseline, will hold on rate  control medication at this time. Patient discharged in stable condition with understanding of reasons to return.    Final Clinical Impression(s) / ED Diagnoses Final diagnoses:  Paroxysmal atrial fibrillation ([place])    Rx / DC Orders ED Discharge Orders         Ordered    apixaban (ELIQUIS) 5 MG TABS tablet  2 times daily     Discontinue  Reprint     07/31/19 0003           Gareth Morgan, MD 07/31/19 [phone]

## 2019-07-30 NOTE — ED Notes (Signed)
Patient transported to X-ray 

## 2019-07-31 MED ORDER — APIXABAN 5 MG PO TABS: 5.0000 mg | ORAL_TABLET | Freq: Two times a day (BID) | ORAL | 0 refills | Status: DC

## 2019-08-04 ENCOUNTER — Inpatient Hospital Stay: Payer: BC Managed Care – PPO | Admitting: Family

## 2019-08-05 ENCOUNTER — Ambulatory Visit (HOSPITAL_COMMUNITY)
Admission: RE | Admit: 2019-08-05 | Discharge: 2019-08-05 | Disposition: A | Discharge status: Home / Self Care | Payer: BC Managed Care – PPO | Source: Ambulatory Visit | Attending: Nurse Practitioner | Admitting: Nurse Practitioner

## 2019-08-05 ENCOUNTER — Ambulatory Visit: Payer: BC Managed Care – PPO | Admitting: Family

## 2019-08-05 ENCOUNTER — Encounter: Payer: Self-pay | Admitting: Family

## 2019-08-05 ENCOUNTER — Other Ambulatory Visit: Payer: Self-pay

## 2019-08-05 VITALS — BP 120/74 | HR 56 | Temp 98.0°F | Resp 16 | Ht 70.0 in | Wt 224.0 lb

## 2019-08-05 VITALS — BP 138/74 | HR 54 | Ht 70.0 in | Wt 223.4 lb

## 2019-08-05 DIAGNOSIS — E669 Obesity, unspecified: Secondary | ICD-10-CM | POA: Diagnosis not present

## 2019-08-05 DIAGNOSIS — I480 Paroxysmal atrial fibrillation: Secondary | ICD-10-CM | POA: Diagnosis not present

## 2019-08-05 DIAGNOSIS — I10 Essential (primary) hypertension: Secondary | ICD-10-CM

## 2019-08-05 DIAGNOSIS — Z7901 Long term (current) use of anticoagulants: Secondary | ICD-10-CM | POA: Insufficient documentation

## 2019-08-05 DIAGNOSIS — H18609 Keratoconus, unspecified, unspecified eye: Secondary | ICD-10-CM | POA: Diagnosis not present

## 2019-08-05 DIAGNOSIS — Z8249 Family history of ischemic heart disease and other diseases of the circulatory system: Secondary | ICD-10-CM | POA: Diagnosis not present

## 2019-08-05 DIAGNOSIS — Z79899 Other long term (current) drug therapy: Secondary | ICD-10-CM | POA: Insufficient documentation

## 2019-08-05 DIAGNOSIS — Z8614 Personal history of Methicillin resistant Staphylococcus aureus infection: Secondary | ICD-10-CM | POA: Insufficient documentation

## 2019-08-05 DIAGNOSIS — Z880 Allergy status to penicillin: Secondary | ICD-10-CM | POA: Diagnosis not present

## 2019-08-05 DIAGNOSIS — F418 Other specified anxiety disorders: Secondary | ICD-10-CM

## 2019-08-05 MED ORDER — DILTIAZEM HCL 30 MG PO TABS: ORAL_TABLET | ORAL | 1 refills | Status: DC

## 2019-08-05 NOTE — Progress Notes (Signed)
Subjective:    Patient ID: Devin [name], male    DOB: August 16, 1970, 49 y.o.   MRN: [id number]  HPI   Patient is a 49 year old male who presents today to reestablish care.  I last saw him back in 2017.  He was recently seen in the emergency department on July 30, 2019 with atrial fibrillation.  This was the first time he had ever been diagnosed with A. fib.  He noted a rapid heart rate and fluttering at that time.  His heart rate on arrival was 120 and was irregular.  He was placed on Eliquis and advised to follow-up with atrial fibrillation clinic.   Since he has been home from the emergency department he reports that he has not had any further palpitations.  Has not had palpitations or chest pain.   History of anxiety-denies any current anxiety.  He believes that his anxiety a few years back was related to some various things he had going on at the time.  Keratoconus-  Has a new rx which helps his vision.  BP Readings from Last 3 Encounters:  08/05/19 120/74  07/31/19 118/68  05/22/19 132/88   Obesity-he reports that his weight is gone up as high as 280.  It then went down to 200 and now is back up to 224.  He plans to work on getting back down to 200 pounds. Wt Readings from Last 3 Encounters:  08/05/19 224 lb (101.6 kg)  07/30/19 227 lb (103 kg)  05/22/19 222 lb (100.7 kg)     Review of Systems See HPI  Past Medical History:  Diagnosis Date   History of MRSA infection    Hypertension    controlled   Keratoconus of both eyes      Social History   Socioeconomic History   Marital status: Married    Spouse name: Not on file   Number of children: 3   Years of education: Not on file   Highest education level: Not on file  Occupational History   Occupation: Banker: [employer]  Tobacco Use   Smoking status: Never Smoker   Smokeless tobacco: Never Used  Vaping Use   Vaping Use: Never used  Substance and Sexual Activity   Alcohol  use: Yes    Comment: weekly   Drug use: Never   Sexual activity: Not on file  Other Topics Concern   Not on file  Social History Narrative   Regular exercise: yes (runs and uses elliptical machine)   Quit school in the 8th grade.   Social Determinants of Health   Financial Resource Strain:    Difficulty of Paying Living Expenses:   Food Insecurity:    Worried About Charity fundraiser in the Last Year:    Arboriculturist in the Last Year:   Transportation Needs:    Film/video editor (Medical):    Lack of Transportation (Non-Medical):   Physical Activity:    Days of Exercise per Week:    Minutes of Exercise per Session:   Stress:    Feeling of Stress :   Social Connections:    Frequency of Communication with Friends and Family:    Frequency of Social Gatherings with Friends and Family:    Attends Religious Services:    Active Member of Clubs or Organizations:    Attends Archivist Meetings:    Marital Status:   Intimate Partner Violence:    Fear of Current  or Ex-Partner:    Emotionally Abused:    Physically Abused:    Sexually Abused:     Past Surgical History:  Procedure Laterality Date   NECK SURGERY  02/03/14    Family History  Problem Relation Age of Onset   Diabetes Mother    Hyperlipidemia Mother    Heart disease Mother        CHF   Thyroid disease Mother        Graves Disease   Hypertension Father    Asthma Daughter    ADD / ADHD Daughter     Allergies  Allergen Reactions   Penicillins Swelling    Has patient had a PCN reaction causing immediate rash, facial/tongue/throat swelling, SOB or lightheadedness with hypotension: Yes Has patient had a PCN reaction causing severe rash involving mucus membranes or skin necrosis: No Has patient had a PCN reaction that required hospitalization: No Has patient had a PCN reaction occurring within the last 10 years: No If all of the above answers are "NO", then may  proceed with Cephalosporin use. REACTION: causes rash and sob.   Shellfish Allergy Nausea And Vomiting    shrimp    Current Outpatient Medications on File Prior to Visit  Medication Sig Dispense Refill   apixaban (ELIQUIS) 5 MG TABS tablet Take 1 tablet (5 mg total) by mouth 2 (two) times daily. 60 tablet 0   No current facility-administered medications on file prior to visit.    BP 120/74 (BP Location: Right Arm, Patient Position: Sitting, Cuff Size: Large)    Pulse (!) 56    Temp 98 F (36.7 C) (Temporal)    Resp 16    Ht 5\' 10"  (1.778 m)    Wt 224 lb (101.6 kg)    SpO2 100%    BMI 32.14 kg/m       Objective:   Physical Exam Constitutional:      General: He is not in acute distress.    Appearance: He is well-developed.  HENT:     Head: Normocephalic and atraumatic.  Cardiovascular:     Rate and Rhythm: Normal rate and regular rhythm.     Heart sounds: No murmur heard.   Pulmonary:     Effort: Pulmonary effort is normal. No respiratory distress.     Breath sounds: Normal breath sounds. No wheezing or rales.  Skin:    General: Skin is warm and dry.  Neurological:     Mental Status: He is alert and oriented to person, place, and time.  Psychiatric:        Behavior: Behavior normal.        Thought Content: Thought content normal.           Assessment & Plan:  Paroxysmal atrial fibrillation-heart rate is stable at this time.  He has [facility].  He is maintained on Eliquis.  I do have some concerns about Eliquis due to his line of work.  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  I will defer to cardiology risk-benefit of ongoing anticoagulation.    History of anxiety-currently stable.  History of hypertension-blood pressure is currently stable without antihypertensive.  We will continue to monitor.  Keratoconus-this is being followed by ophthalmology.  He is pleased with this Glasses prescription as it  has significantly improved his vision.  He states he has failed multiple different contact lenses.  Obesity-discussed weight loss.  This visit occurred during the SARS-CoV-2 public  health emergency.  Safety protocols were in place, including screening questions prior to the visit, additional usage of staff PPE, and extensive cleaning of exam room while observing appropriate contact time as indicated for disinfecting solutions.

## 2019-08-05 NOTE — Patient Instructions (Signed)
Stop eliquis 

## 2019-08-06 ENCOUNTER — Encounter (HOSPITAL_COMMUNITY): Payer: Self-pay | Admitting: Nurse Practitioner

## 2019-08-06 NOTE — Progress Notes (Signed)
Primary Care Physician: Debbrah Alar, NP Referring Physician: [facility] ER f/u    Devin [name] is a 49 y.o. male with a h/o obesity, HTN, alcohol abuse that is in the afib clinic for new onset afib, treated in the ED 6/9. He did spontaneously convert and remains in SR today. He noted heart racing while watching TV and apple watch noted afib He was started on DOAC but with CHA2DS2VASc score of 1, he does not require daily per guidelines. 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 He also will note HR's in the 40's at imes without any an nodal agents on board. TSH in ER wnl, as well as other screening labs. CXR with no acute findings. Has not had covid vaccines.  He does report that he does not drink alcohol every day but when he does drink 2-3 times a week he may drink up tp 22 beers in one setting. High caffeine intake as well. He weighted 280 pounds several years ago, lost down to 200 lb with recent weight gain back up to 223. He snored when he was heavier but wife states no recent snoring.   Today, he denies symptoms of palpitations, chest pain, shortness of breath, orthopnea, PND, lower extremity edema, dizziness, presyncope, syncope, or neurologic sequela. The patient is tolerating medications without difficulties and is otherwise without complaint today.   Past Medical History:  Diagnosis Date  . History of MRSA infection   . Hypertension    controlled  . Keratoconus of both eyes    Past Surgical History:  Procedure Laterality Date  . NECK SURGERY  02/03/14    Current Outpatient Medications  Medication Sig Dispense Refill  . diltiazem (CARDIZEM) 30 MG tablet Take 1 tablet every 4 hours AS NEEDED for AFIB heart rate >100 45 tablet 1   No current facility-administered medications for this encounter.    Allergies  Allergen Reactions  . Other Nausea And Vomiting    shrimp shrimp   . Penicillins Swelling    Has patient had a  PCN reaction causing immediate rash, facial/tongue/throat swelling, SOB or lightheadedness with hypotension: Yes Has patient had a PCN reaction causing severe rash involving mucus membranes or skin necrosis: No Has patient had a PCN reaction that required hospitalization: No Has patient had a PCN reaction occurring within the last 10 years: No If all of the above answers are "NO", then may proceed with Cephalosporin use. REACTION: causes rash and sob.  . Shellfish Allergy Nausea And Vomiting    shrimp    Social History   Socioeconomic History  . Marital status: Married    Spouse name: Not on file  . Number of children: 3  . Years of education: Not on file  . Highest education level: Not on file  Occupational History  . Occupation: Banker: [employer]  Tobacco Use  . Smoking status: Never Smoker  . Smokeless tobacco: Never Used  Vaping Use  . Vaping Use: Never used  Substance and Sexual Activity  . Alcohol use: Yes    Comment: weekly  . Drug use: Never  . Sexual activity: Not on file  Other Topics Concern  . Not on file  Social History Narrative   Regular exercise: yes (runs and uses elliptical machine)   Quit school in the 8th grade.   Social Determinants of Health   Financial Resource Strain:   . Difficulty of Paying Living Expenses:  Food Insecurity:   . Worried About Programme researcher, broadcasting/film/video in the Last Year:   . Barista in the Last Year:   Transportation Needs:   . Freight forwarder (Medical):   Marland Kitchen Lack of Transportation (Non-Medical):   Physical Activity:   . Days of Exercise per Week:   . Minutes of Exercise per Session:   Stress:   . Feeling of Stress :   Social Connections:   . Frequency of Communication with Friends and Family:   . Frequency of Social Gatherings with Friends and Family:   . Attends Religious Services:   . Active Member of Clubs or Organizations:   . Attends Banker Meetings:   Marland Kitchen Marital Status:    Intimate Partner Violence:   . Fear of Current or Ex-Partner:   . Emotionally Abused:   Marland Kitchen Physically Abused:   . Sexually Abused:     Family History  Problem Relation Age of Onset  . Diabetes Mother   . Hyperlipidemia Mother   . Heart disease Mother        CHF  . Thyroid disease Mother        Graves Disease  . Hypertension Father   . Asthma Daughter   . ADD / ADHD Daughter     ROS- All systems are reviewed and negative except as per the HPI above  Physical Exam: Vitals:   08/05/19 1542  BP: 138/74  Pulse: (!) 54  Weight: 101.3 kg  Height: 5\' 10"  (1.778 m)   Wt Readings from Last 3 Encounters:  08/05/19 101.3 kg  08/05/19 101.6 kg  07/30/19 103 kg    Labs: Lab Results  Component Value Date   NA 138 07/30/2019   K 3.6 07/30/2019   CL 104 07/30/2019   CO2 24 07/30/2019   GLUCOSE 93 07/30/2019   BUN 19 07/30/2019   CREATININE 0.99 07/30/2019   CALCIUM 9.0 07/30/2019   No results found for: INR No results found for: CHOL, HDL, LDLCALC, TRIG   GEN- The patient is well appearing, alert and oriented x 3 today.   Head- normocephalic, atraumatic Eyes-  Sclera clear, conjunctiva pink Ears- hearing intact Oropharynx- clear Neck- supple, no JVP Lymph- no cervical lymphadenopathy Lungs- Clear to ausculation bilaterally, normal work of breathing Heart- Regular rate and rhythm, no murmurs, rubs or gallops, PMI not laterally displaced GI- soft, NT, ND, + BS Extremities- no clubbing, cyanosis, or edema MS- no significant deformity or atrophy Skin- no rash or lesion Psych- euthymic mood, full affect Neuro- strength and sensation are intact  EKG-sinus brady at 54 bpm, pr int 157 ms, qrs int 96 ms, qtc 413 ms    Assessment and Plan: 1. Paroxysmal  afib  New onset General education and triggers discussed  Pt cautioned against binge  drinking and no more than 2 weekly alcohol drinks recommended  Cut back on caffeine intake 30 mg Cardizem was given to use prn  with instructions in use Would not recommend daily rate control as has brady at baseline Echo ordered Will place one week Zio patch after echo  Weight loss encouraged   2. CHA2DS2VASc score of 1 By guidelines he does not have to be on daily  Anticoagulation Cab stop stop drug for now    F/u in one month   09/29/2019 C. Lupita Leash [facility] [address] [place], [place] [phone]

## 2019-08-12 ENCOUNTER — Other Ambulatory Visit: Payer: Self-pay

## 2019-08-12 ENCOUNTER — Ambulatory Visit (HOSPITAL_COMMUNITY)
Admission: RE | Admit: 2019-08-12 | Discharge: 2019-08-12 | Disposition: A | Discharge status: Home / Self Care | Payer: BC Managed Care – PPO | Source: Ambulatory Visit | Attending: Nurse Practitioner | Admitting: Nurse Practitioner

## 2019-08-12 ENCOUNTER — Encounter (HOSPITAL_COMMUNITY): Payer: Self-pay

## 2019-08-12 ENCOUNTER — Ambulatory Visit (HOSPITAL_COMMUNITY)
Admission: RE | Admit: 2019-08-12 | Payer: BC Managed Care – PPO | Source: Ambulatory Visit | Admitting: Nurse Practitioner

## 2019-08-12 DIAGNOSIS — I480 Paroxysmal atrial fibrillation: Secondary | ICD-10-CM

## 2019-08-12 DIAGNOSIS — I10 Essential (primary) hypertension: Secondary | ICD-10-CM | POA: Insufficient documentation

## 2019-08-12 DIAGNOSIS — I4891 Unspecified atrial fibrillation: Secondary | ICD-10-CM | POA: Diagnosis not present

## 2019-08-12 NOTE — Progress Notes (Signed)
  Echocardiogram 2D Echocardiogram has been performed.  Tye Savoy 08/12/2019, 2:39 PM

## 2019-08-15 ENCOUNTER — Encounter (HOSPITAL_COMMUNITY): Payer: Self-pay | Admitting: *Deleted

## 2019-08-22 ENCOUNTER — Ambulatory Visit: Payer: BC Managed Care – PPO | Admitting: Family Medicine

## 2019-09-04 DIAGNOSIS — I480 Paroxysmal atrial fibrillation: Secondary | ICD-10-CM | POA: Diagnosis not present

## 2019-09-05 NOTE — Addendum Note (Signed)
Encounter addended by: Shona Simpson, RN on: 09/05/2019 8:59 AM  Actions taken: Imaging Exam ended

## 2019-09-11 ENCOUNTER — Ambulatory Visit (HOSPITAL_COMMUNITY): Payer: BC Managed Care – PPO | Admitting: Nurse Practitioner

## 2019-09-16 ENCOUNTER — Ambulatory Visit (HOSPITAL_COMMUNITY)
Admission: RE | Admit: 2019-09-16 | Discharge: 2019-09-16 | Disposition: A | Discharge status: Home / Self Care | Payer: BC Managed Care – PPO | Source: Ambulatory Visit | Attending: Nurse Practitioner | Admitting: Nurse Practitioner

## 2019-09-16 ENCOUNTER — Other Ambulatory Visit: Payer: Self-pay

## 2019-09-16 ENCOUNTER — Encounter (HOSPITAL_COMMUNITY): Payer: Self-pay | Admitting: Nurse Practitioner

## 2019-09-16 VITALS — BP 146/88 | HR 49 | Ht 70.0 in | Wt 232.6 lb

## 2019-09-16 DIAGNOSIS — Z6833 Body mass index (BMI) 33.0-33.9, adult: Secondary | ICD-10-CM | POA: Diagnosis not present

## 2019-09-16 DIAGNOSIS — I10 Essential (primary) hypertension: Secondary | ICD-10-CM | POA: Diagnosis not present

## 2019-09-16 DIAGNOSIS — I480 Paroxysmal atrial fibrillation: Secondary | ICD-10-CM | POA: Insufficient documentation

## 2019-09-16 DIAGNOSIS — Z8249 Family history of ischemic heart disease and other diseases of the circulatory system: Secondary | ICD-10-CM | POA: Diagnosis not present

## 2019-09-16 DIAGNOSIS — E669 Obesity, unspecified: Secondary | ICD-10-CM | POA: Diagnosis not present

## 2019-09-16 DIAGNOSIS — F1010 Alcohol abuse, uncomplicated: Secondary | ICD-10-CM | POA: Diagnosis not present

## 2019-09-16 NOTE — Progress Notes (Signed)
Primary Care Physician: Sandford Craze, NP Referring Physician: [facility] ER f/u    Devin [name] is a 49 y.o. male with a h/o obesity, HTN, alcohol abuse that is in the afib clinic for new onset afib, treated in the ED 6/9. He did spontaneously convert and remains in SR today. He noted heart racing while watching TV and apple watch noted afib He was started on DOAC but with CHA2DS2VASc score of 1, he does not require daily per guidelines. 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 He also will note HR's in the 40's at imes without any an nodal agents on board. TSH in ER wnl, as well as other screening labs. CXR with no acute findings. Has not had covid vaccines.  He does report that he does not drink alcohol every day but when he does drink 2-3 times a week he may drink up tp 22 beers in one setting. High caffeine intake as well. He weighted 280 pounds several years ago, lost down to 200 lb with recent weight gain back up to 223. He snored when he was heavier but wife states no recent snoring.   F/u in afib clinic, 09/16/19. He does not report any further afib. Zio monitor did not show any afib as well. His echo of the heart was normal.     Today, he denies symptoms of palpitations, chest pain, shortness of breath, orthopnea, PND, lower extremity edema, dizziness, presyncope, syncope, or neurologic sequela. The patient is tolerating medications without difficulties and is otherwise without complaint today.   Past Medical History:  Diagnosis Date  . History of MRSA infection   . Hypertension    controlled  . Keratoconus of both eyes    Past Surgical History:  Procedure Laterality Date  . NECK SURGERY  02/03/14    Current Outpatient Medications  Medication Sig Dispense Refill  . diltiazem (CARDIZEM) 30 MG tablet Take 1 tablet every 4 hours AS NEEDED for AFIB heart rate >100 45 tablet 1   No current facility-administered medications for  this encounter.    Allergies  Allergen Reactions  . Other Nausea And Vomiting    shrimp shrimp   . Penicillins Swelling    Has patient had a PCN reaction causing immediate rash, facial/tongue/throat swelling, SOB or lightheadedness with hypotension: Yes Has patient had a PCN reaction causing severe rash involving mucus membranes or skin necrosis: No Has patient had a PCN reaction that required hospitalization: No Has patient had a PCN reaction occurring within the last 10 years: No If all of the above answers are "NO", then may proceed with Cephalosporin use. REACTION: causes rash and sob.  . Shellfish Allergy Nausea And Vomiting    shrimp    Social History   Socioeconomic History  . Marital status: Married    Spouse name: Not on file  . Number of children: 3  . Years of education: Not on file  . Highest education level: Not on file  Occupational History  . Occupation: Chief Strategy Officer: [employer]  Tobacco Use  . Smoking status: Never Smoker  . Smokeless tobacco: Never Used  Vaping Use  . Vaping Use: Never used  Substance and Sexual Activity  . Alcohol use: Yes    Comment: weekly  . Drug use: Never  . Sexual activity: Not on file  Other Topics Concern  . Not on file  Social History Narrative   Regular exercise: yes (runs  and uses elliptical machine)   Quit school in the 8th grade.   Social Determinants of Health   Financial Resource Strain:   . Difficulty of Paying Living Expenses:   Food Insecurity:   . Worried About Programme researcher, broadcasting/film/video in the Last Year:   . Barista in the Last Year:   Transportation Needs:   . Freight forwarder (Medical):   Marland Kitchen Lack of Transportation (Non-Medical):   Physical Activity:   . Days of Exercise per Week:   . Minutes of Exercise per Session:   Stress:   . Feeling of Stress :   Social Connections:   . Frequency of Communication with Friends and Family:   . Frequency of Social Gatherings with Friends and  Family:   . Attends Religious Services:   . Active Member of Clubs or Organizations:   . Attends Banker Meetings:   Marland Kitchen Marital Status:   Intimate Partner Violence:   . Fear of Current or Ex-Partner:   . Emotionally Abused:   Marland Kitchen Physically Abused:   . Sexually Abused:     Family History  Problem Relation Age of Onset  . Diabetes Mother   . Hyperlipidemia Mother   . Heart disease Mother        CHF  . Thyroid disease Mother        Graves Disease  . Hypertension Father   . Asthma Daughter   . ADD / ADHD Daughter     ROS- All systems are reviewed and negative except as per the HPI above  Physical Exam: Vitals:   09/16/19 1514  BP: (!) 146/88  Pulse: 49  Weight: (!) 105.5 kg  Height: 5\' 10"  (1.778 m)   Wt Readings from Last 3 Encounters:  09/16/19 (!) 105.5 kg  08/05/19 101.3 kg  08/05/19 101.6 kg    Labs: Lab Results  Component Value Date   NA 138 07/30/2019   K 3.6 07/30/2019   CL 104 07/30/2019   CO2 24 07/30/2019   GLUCOSE 93 07/30/2019   BUN 19 07/30/2019   CREATININE 0.99 07/30/2019   CALCIUM 9.0 07/30/2019   No results found for: INR No results found for: CHOL, HDL, LDLCALC, TRIG   GEN- The patient is well appearing, alert and oriented x 3 today.   Head- normocephalic, atraumatic Eyes-  Sclera clear, conjunctiva pink Ears- hearing intact Oropharynx- clear Neck- supple, no JVP Lymph- no cervical lymphadenopathy Lungs- Clear to ausculation bilaterally, normal work of breathing Heart- Regular rate and rhythm, no murmurs, rubs or gallops, PMI not laterally displaced GI- soft, NT, ND, + BS Extremities- no clubbing, cyanosis, or edema MS- no significant deformity or atrophy Skin- no rash or lesion Psych- euthymic mood, full affect Neuro- strength and sensation are intact  EKG-sinus brady at 49  bpm, pr int 164 ms, qrs int 96 ms, qtc 383 ms    Assessment and Plan: 1. Paroxysmal  afib  New onset 6/9  No further issues with afib   Triggers discussed  Pt  has cut back on alcohol and caffeine  Pt cautioned against binge  drinking and no more than 2 weekly alcohol drinks recommended  30 mg Cardizem was given to use prn with instructions in use, has not needed  Would not recommend daily rate control as has brady at baseline Echo was normal and reviewed with pt  Zio patch did not show any afib  Weight loss encouraged   2. CHA2DS2VASc score of 1  By guidelines he does not have to be on daily  Anticoagulation He  is not on drug currently   afib clinic as needed   Lupita Leash C. [facility] [address] [place], [place] [address] [phone]

## 2019-09-23 ENCOUNTER — Ambulatory Visit: Payer: BC Managed Care – PPO | Admitting: Family Medicine

## 2019-09-23 ENCOUNTER — Other Ambulatory Visit: Payer: Self-pay

## 2019-09-23 ENCOUNTER — Encounter: Payer: Self-pay | Admitting: Family Medicine

## 2019-09-23 VITALS — BP 138/84 | HR 62 | Temp 97.9°F | Ht 68.0 in | Wt 232.4 lb

## 2019-09-23 DIAGNOSIS — G5602 Carpal tunnel syndrome, left upper limb: Secondary | ICD-10-CM | POA: Diagnosis not present

## 2019-09-23 DIAGNOSIS — K5900 Constipation, unspecified: Secondary | ICD-10-CM | POA: Diagnosis not present

## 2019-09-23 DIAGNOSIS — G252 Other specified forms of tremor: Secondary | ICD-10-CM | POA: Diagnosis not present

## 2019-09-23 MED ORDER — PROPRANOLOL HCL ER 60 MG PO CP24: 60.0000 mg | ORAL_CAPSULE | Freq: Every day | ORAL | 2 refills | Status: DC

## 2019-09-23 NOTE — Progress Notes (Signed)
Chief Complaint  Patient presents with  . Follow-up    hand shaking    Subjective: Patient is a 49 y.o. male here for shaking.  Has had shaking in both hands, worse on L for past several mo. Went to Neuro who thought he had carpal tunnel. No workup or tx on that visit? He is dropping things. Shaking more prominent when he moves or uses his hand. He is R handed. Has never been on any medication for this but is interested. No hx of stroke or injury.   3 d of constipation. Tried taking OTC pill but was not helpful. No BM since Saturday and he had a lot of straining and pain. He is passing gas. No fevers, N/V.   Past Medical History:  Diagnosis Date  . History of MRSA infection   . Hypertension    controlled  . Keratoconus of both eyes     Objective: BP 138/84 (BP Location: Left Arm, Patient Position: Sitting, Cuff Size: Normal)   Pulse 62   Temp 97.9 F (36.6 C) (Oral)   Ht 5\' 8"  (1.727 m)   Wt 232 lb 6 oz (105.4 kg)   SpO2 97%   BMI 35.33 kg/m  General: Awake, appears stated age HEENT: MMM, EOMi Heart: RRR Neuro: Slightly resting tremor, worse with movement; +tinel's and Phalen's on L  Lungs: CTAB, no rales, wheezes or rhonchi. No accessory muscle use Abd: BS+, S, mildly distended, no ttp Psych: Age appropriate judgment and insight, normal affect and mood  Assessment and Plan: Intention tremor - Plan: Ambulatory referral to Neurology, propranolol ER (INDERAL LA) 60 MG 24 hr capsule  Carpal tunnel syndrome of left wrist  Constipation, unspecified constipation type  1. Refer to new neuro team, start propranolol. F/u in 1 mo. 2. Wrist splint. 3. Stay hydrated. MiraLAX 1-2 times daily for 3 d, enema if no better. Not suggestive of SBO.  The patient voiced understanding and agreement to the plan.  [place], DO 09/23/19  4:47 PM

## 2019-09-23 NOTE — Patient Instructions (Signed)
Try MiraLAX 1-2 times daily over the next 3-4 days. If no improvement, try using an enema. Stay well hydrated and keep lots of fiber in your diet.  If you do not hear anything about your referral in the next 1-2 weeks, call our office and ask for an update.  Wear your splint at night and during activities that aggravate it.   Let us know if you need anything.

## 2019-09-25 DIAGNOSIS — L237 Allergic contact dermatitis due to plants, except food: Secondary | ICD-10-CM | POA: Diagnosis not present

## 2019-10-19 ENCOUNTER — Other Ambulatory Visit: Payer: Self-pay | Admitting: Family Medicine

## 2019-10-19 DIAGNOSIS — G252 Other specified forms of tremor: Secondary | ICD-10-CM

## 2019-10-28 ENCOUNTER — Ambulatory Visit: Payer: BC Managed Care – PPO | Admitting: Family Medicine

## 2019-10-28 ENCOUNTER — Ambulatory Visit: Payer: BC Managed Care – PPO | Admitting: Family

## 2019-11-04 ENCOUNTER — Ambulatory Visit: Payer: BC Managed Care – PPO | Admitting: Family Medicine

## 2019-11-05 DIAGNOSIS — U071 COVID-19: Secondary | ICD-10-CM | POA: Diagnosis not present

## 2019-11-05 DIAGNOSIS — Z20822 Contact with and (suspected) exposure to covid-19: Secondary | ICD-10-CM | POA: Diagnosis not present

## 2019-11-05 LAB — NOVEL CORONAVIRUS, NAA: SARS-CoV-2, NAA: DETECTED

## 2019-11-06 NOTE — Telephone Encounter (Signed)
Devin [name]- I have called Pt into monoclonal infusion hotline this morning.

## 2019-11-07 ENCOUNTER — Encounter: Payer: Self-pay | Admitting: Oncology

## 2019-11-07 ENCOUNTER — Other Ambulatory Visit: Payer: Self-pay | Admitting: Oncology

## 2019-11-07 DIAGNOSIS — U071 COVID-19: Secondary | ICD-10-CM

## 2019-11-07 NOTE — Progress Notes (Signed)
I connected by phone with  Mr. Chartrand to discuss the potential use of an new treatment for mild to moderate COVID-19 viral infection in non-hospitalized patients.   This patient is a age/sex that meets the FDA criteria for Emergency Use Authorization of casirivimab\imdevimab.  Has a (+) direct SARS-CoV-2 viral test result 1. Has mild or moderate COVID-19  2. Is ? 47 years of age and weighs ? 40 kg 3. Is NOT hospitalized due to COVID-19 4. Is NOT requiring oxygen therapy or requiring an increase in baseline oxygen flow rate due to COVID-19 5. Is within 10 days of symptom onset 6. Has at least one of the high risk factor(s) for progression to severe COVID-19 and/or hospitalization as defined in EUA. Specific high risk criteria : Past Medical History:  Diagnosis Date  . History of MRSA infection   . Hypertension    controlled  . Keratoconus of both eyes   ?  ?    Symptom onset  11/04/2019   I have spoken and communicated the following to the patient or parent/caregiver:   1. FDA has authorized the emergency use of casirivimab\imdevimab for the treatment of mild to moderate COVID-19 in adults and pediatric patients with positive results of direct SARS-CoV-2 viral testing who are 71 years of age and older weighing at least 40 kg, and who are at high risk for progressing to severe COVID-19 and/or hospitalization.   2. The significant known and potential risks and benefits of casirivimab\imdevimab, and the extent to which such potential risks and benefits are unknown.   3. Information on available alternative treatments and the risks and benefits of those alternatives, including clinical trials.   4. Patients treated with casirivimab\imdevimab should continue to self-isolate and use infection control measures (e.g., wear mask, isolate, social distance, avoid sharing personal items, clean and disinfect "high touch" surfaces, and frequent handwashing) according to CDC guidelines.    5. The  patient or parent/caregiver has the option to accept or refuse casirivimab\imdevimab .   After reviewing this information with the patient, The patient agreed to proceed with receiving casirivimab\imdevimab infusion and will be provided a copy of the Fact sheet prior to receiving the infusion.Mignon [place], AGNP-C [phone] (Infusion Center Hotline)

## 2019-11-08 ENCOUNTER — Ambulatory Visit (HOSPITAL_COMMUNITY)
Admission: RE | Admit: 2019-11-08 | Discharge: 2019-11-08 | Disposition: A | Discharge status: Home / Self Care | Payer: BC Managed Care – PPO | Source: Ambulatory Visit | Attending: Pulmonary Disease | Admitting: Pulmonary Disease

## 2019-11-08 ENCOUNTER — Other Ambulatory Visit (HOSPITAL_COMMUNITY): Payer: Self-pay

## 2019-11-08 DIAGNOSIS — U071 COVID-19: Secondary | ICD-10-CM | POA: Insufficient documentation

## 2019-11-08 MED ORDER — DIPHENHYDRAMINE HCL 50 MG/ML IJ SOLN: 50.0000 mg | Freq: Once | INTRAMUSCULAR | Status: DC | PRN

## 2019-11-08 MED ORDER — SODIUM CHLORIDE 0.9 % IV SOLN
1200.0000 mg | Freq: Once | INTRAVENOUS | Status: AC
  Administered 2019-11-08: 1200 mg via INTRAVENOUS

## 2019-11-08 MED ORDER — METHYLPREDNISOLONE SODIUM SUCC 125 MG IJ SOLR: 125.0000 mg | Freq: Once | INTRAMUSCULAR | Status: DC | PRN

## 2019-11-08 MED ORDER — SODIUM CHLORIDE 0.9 % IV SOLN: INTRAVENOUS | Status: DC | PRN

## 2019-11-08 MED ORDER — ALBUTEROL SULFATE HFA 108 (90 BASE) MCG/ACT IN AERS: 2.0000 | INHALATION_SPRAY | Freq: Once | RESPIRATORY_TRACT | Status: DC | PRN

## 2019-11-08 MED ORDER — EPINEPHRINE 0.3 MG/0.3ML IJ SOAJ: 0.3000 mg | Freq: Once | INTRAMUSCULAR | Status: DC | PRN

## 2019-11-08 MED ORDER — FAMOTIDINE IN NACL 20-0.9 MG/50ML-% IV SOLN: 20.0000 mg | Freq: Once | INTRAVENOUS | Status: DC | PRN

## 2019-11-08 NOTE — Discharge Instructions (Signed)

## 2019-11-08 NOTE — Progress Notes (Signed)
  Diagnosis: COVID-19  Physician: Patrick Wright, MD  Procedure: Covid Infusion Clinic Med: casirivimab\imdevimab infusion - Provided patient with casirivimab\imdevimab fact sheet for patients, parents and caregivers prior to infusion.  Complications: No immediate complications noted.  Discharge: Discharged home   Devin [name] Devin [name] 11/08/2019  

## 2019-11-11 DIAGNOSIS — M62838 Other muscle spasm: Secondary | ICD-10-CM | POA: Diagnosis not present

## 2019-11-15 ENCOUNTER — Other Ambulatory Visit: Payer: Self-pay | Admitting: Family Medicine

## 2019-11-15 DIAGNOSIS — G252 Other specified forms of tremor: Secondary | ICD-10-CM

## 2019-11-26 ENCOUNTER — Other Ambulatory Visit: Payer: Self-pay

## 2019-11-26 ENCOUNTER — Encounter: Payer: Self-pay | Admitting: Family Medicine

## 2019-11-26 ENCOUNTER — Ambulatory Visit (INDEPENDENT_AMBULATORY_CARE_PROVIDER_SITE_OTHER): Payer: BC Managed Care – PPO | Admitting: Family Medicine

## 2019-11-26 VITALS — BP 130/76 | HR 59 | Temp 97.5°F | Ht 69.0 in | Wt 231.2 lb

## 2019-11-26 DIAGNOSIS — Z23 Encounter for immunization: Secondary | ICD-10-CM

## 2019-11-26 DIAGNOSIS — G252 Other specified forms of tremor: Secondary | ICD-10-CM | POA: Diagnosis not present

## 2019-11-26 DIAGNOSIS — L309 Dermatitis, unspecified: Secondary | ICD-10-CM

## 2019-11-26 MED ORDER — TRIAMCINOLONE ACETONIDE 0.1 % EX CREA: 1.0000 "application " | TOPICAL_CREAM | Freq: Two times a day (BID) | CUTANEOUS | 0 refills | Status: DC

## 2019-11-26 MED ORDER — PROPRANOLOL HCL ER 80 MG PO CP24: 80.0000 mg | ORAL_CAPSULE | Freq: Every day | ORAL | 3 refills | Status: DC

## 2019-11-26 NOTE — Patient Instructions (Signed)
I commend you for going to get the covid-19 vaccine.   Let me know if there are issues on the higher dosage.   Try to avoid scented products for your skin. Continue to moisturize.  Let us know if you need anything.

## 2019-11-26 NOTE — Progress Notes (Signed)
Chief Complaint  Patient presents with  . Follow-up    Devin [name] is a 49 y.o. male here for a skin complaint.  Duration: 1 week Location: R knee and R ankle Pruritic? Yes Painful? No Drainage? No New soaps/lotions/topicals/detergents? No Sick contacts? No Other associated symptoms: lightening of the skin Therapies tried thus far: None  F/u for tremor.  He started propanolol extended release 60 mg daily.  He is compliant for the most part and does not report any adverse effects.  It has helped his tremor significantly.  He still has some issues but is no longer dropping items.  Past Medical History:  Diagnosis Date  . History of MRSA infection   . Hypertension    controlled  . Keratoconus of both eyes     BP 130/76 (BP Location: Left Arm, Patient Position: Sitting, Cuff Size: Normal)   Pulse (!) 59   Temp (!) 97.5 F (36.4 C) (Oral)   Ht 5\' 9"  (1.753 m)   Wt 231 lb 4 oz (104.9 kg)   SpO2 98%   BMI 34.15 kg/m  Gen: awake, alert, appearing stated age Lungs: Clear to auscultation bilaterally.  No accessory muscle use Heart: RRR Skin: See below. No drainage, erythema, TTP, fluctuance, excoriation Psych: Age appropriate judgment and insight        Dermatitis - Plan: triamcinolone cream (KENALOG) 0.1 %  Intention tremor - Plan: propranolol ER (INDERAL LA) 80 MG 24 hr capsule  Need for influenza vaccination - Plan: Flu Vaccine QUAD 6+ mos PF IM (Fluarix Quad PF)  1.  Steroid cream. 2.  Increase propanolol from 60 mg daily to 80 mg daily. Follow-up in 6 weeks for physical and we can check in on how he is doing on the higher dose of the propranolol. The patient voiced understanding and agreement to the plan.  [place], DO 11/26/19 4:37 PM

## 2019-11-27 ENCOUNTER — Ambulatory Visit: Payer: Self-pay | Admitting: Neurology

## 2019-12-05 ENCOUNTER — Encounter: Payer: Self-pay | Admitting: Family Medicine

## 2019-12-05 ENCOUNTER — Other Ambulatory Visit: Payer: Self-pay

## 2019-12-05 ENCOUNTER — Telehealth (INDEPENDENT_AMBULATORY_CARE_PROVIDER_SITE_OTHER): Payer: BC Managed Care – PPO | Admitting: Family Medicine

## 2019-12-05 DIAGNOSIS — T50Z95A Adverse effect of other vaccines and biological substances, initial encounter: Secondary | ICD-10-CM

## 2019-12-05 MED ORDER — BENZONATATE 100 MG PO CAPS: 100.0000 mg | ORAL_CAPSULE | Freq: Three times a day (TID) | ORAL | 0 refills | Status: DC | PRN

## 2019-12-05 MED ORDER — MELOXICAM 15 MG PO TABS: 15.0000 mg | ORAL_TABLET | Freq: Every day | ORAL | 0 refills | Status: DC

## 2019-12-05 NOTE — Progress Notes (Signed)
Chief Complaint  Patient presents with  . Cough    had flu shot recently thinks is from the vaccine.  . Ear Pain  . Sore Throat    Devin [name] here for URI complaints.  Duration: 1 week  Associated symptoms: rhinorrhea, ear pain, ear drainage, sore throat and cough Denies: sinus pain, itchy watery eyes, sore throat, wheezing, shortness of breath, myalgia and fevers Treatment to date: Mucinex Sick contacts: No  Got flu shot on 10/6.  Past Medical History:  Diagnosis Date  . History of MRSA infection   . Hypertension    controlled  . Keratoconus of both eyes      Adverse effect of vaccine, initial encounter - Plan: benzonatate (TESSALON) 100 MG capsule, meloxicam (MOBIC) 15 MG tablet  NSAID and cough med as above. Push fluids. This should resolve spontaneously.  F/u prn. If starting to experience fevers, shaking, or shortness of breath, seek immediate care. Pt voiced understanding and agreement to the plan.  Jilda Roche [place], DO 12/05/19 11:49 AM

## 2019-12-06 ENCOUNTER — Telehealth: Payer: Self-pay | Admitting: *Deleted

## 2019-12-06 DIAGNOSIS — T50Z95A Adverse effect of other vaccines and biological substances, initial encounter: Secondary | ICD-10-CM

## 2019-12-06 MED ORDER — BENZONATATE 100 MG PO CAPS: 100.0000 mg | ORAL_CAPSULE | Freq: Three times a day (TID) | ORAL | 0 refills | Status: DC | PRN

## 2019-12-06 MED ORDER — MELOXICAM 15 MG PO TABS: 15.0000 mg | ORAL_TABLET | Freq: Every day | ORAL | 0 refills | Status: DC

## 2019-12-06 NOTE — Telephone Encounter (Signed)
Rxs for Benzonatate and Meloxicam were sent via escribe today due to the system being down on 10/15.  Left a detailed message at the pts cell number with this info.

## 2019-12-08 ENCOUNTER — Telehealth: Payer: Self-pay

## 2019-12-08 NOTE — Telephone Encounter (Signed)
Nurse Assessment Nurse: Judith Part, RN, Santina Evans Date/Time (Eastern Time): 12/05/2019 9:48:02 PM Confirm and document reason for call. If symptomatic, describe symptoms. ---caller states pt had video visit today but scripts were not sent to pharmacy([facility] in [place] [phone]). was stated in [place] they sent 2 prescriptions. sick since he got the flu shot. meloxicam and benzonatate. cough, sore throat, ear pain. no new or worse symptoms. Does the patient have any new or worsening symptoms? ---Yes Will a triage be completed? ---Yes Related visit to physician within the last 2 weeks? ---Yes Does the PT have any chronic conditions? (i.e. diabetes, asthma, this includes High risk factors for pregnancy, etc.) ---No Is this a behavioral health or substance abuse call? ---No Guidelines Guideline Title Affirmed Question Affirmed Notes Nurse Date/Time Devin [name] Time) Recent Medical Visit for Illness Follow-up Call [1] Caller has URGENT question (includes prescribed medication questions) AND [2] triager unable to answer question Judith Part, RN, Santina Evans 12/05/2019 9:52:15 PM PLEASE NOTE: All timestamps contained within this report are represented as [place] Standard Time. CONFIDENTIALTY NOTICE: This fax transmission is intended only for the addressee. It contains information that is legally privileged, confidential or otherwise protected from use or disclosure. If you are not the intended recipient, you are strictly prohibited from reviewing, disclosing, copying using or disseminating any of this information or taking any action in reliance on or regarding this information. If you have received this fax in error, please notify us immediately by telephone so that we can arrange for its return to [name]. Phone: [phone], Toll-Free: [phone], Fax: [phone] Page: 2 of 2 Call Id: [id number] Disp. Time Devin [name] Time) Disposition Final User 12/05/2019 9:55:09 PM Paged On Call back to [facility], RN, Santina Evans 12/05/2019 9:53:52 PM Call PCP Now Yes Judith Part, RN, [name] Disagree/Comply Comply Caller Understands Yes PreDisposition InappropriateToAsk Care Advice Given Per Guideline CALL PCP NOW: * You need to discuss this with your doctor (or NP/PA). CARE ADVICE given per Recent Medical Visit for Illness: Follow-Up Call (Adult) guideline. CALL BACK IF: * You become worse Comments User: Devin Going, RN Date/Time Devin [name] Time): 12/05/2019 9:52:53 PM was a little light headed [facility] Phone DateTime Result/Outcome Message Type Notes Evelena Peat MD [phone] 12/05/2019 9:55:09 PM Paged On Call Back to Call Center Doctor Paged [phone] catherine Renato Gails - MD 12/05/2019 10:04:32 PM Spoke with On Call - General Message Result Dr said he will get on the computer and send in if clear   Rx's sent by on call provider.

## 2020-01-12 ENCOUNTER — Encounter: Payer: Self-pay | Admitting: Family

## 2020-01-12 ENCOUNTER — Other Ambulatory Visit: Payer: Self-pay

## 2020-01-12 ENCOUNTER — Ambulatory Visit (INDEPENDENT_AMBULATORY_CARE_PROVIDER_SITE_OTHER): Payer: BC Managed Care – PPO | Admitting: Family

## 2020-01-12 VITALS — BP 135/67 | HR 45 | Temp 97.5°F | Resp 16 | Ht 70.0 in | Wt 237.0 lb

## 2020-01-12 DIAGNOSIS — Z0000 Encounter for general adult medical examination without abnormal findings: Secondary | ICD-10-CM

## 2020-01-12 DIAGNOSIS — Z23 Encounter for immunization: Secondary | ICD-10-CM | POA: Diagnosis not present

## 2020-01-12 NOTE — Addendum Note (Signed)
Addended by: Wilford Corner on: 01/12/2020 09:16 AM   Modules accepted: Orders

## 2020-01-12 NOTE — Patient Instructions (Addendum)
Try adding colace 121m once or twice daily as needed for constipation.  You should be contacted about scheduling your colonoscopy.    Preventive Care [age]Years Old, Male Preventive care refers to lifestyle choices and visits with your health care provider that can promote health and wellness. This includes:  A yearly physical exam. This is also called an annual well check.  Regular dental and eye exams.  Immunizations.  Screening for certain conditions.  Healthy lifestyle choices, such as eating a healthy diet, getting regular exercise, not using drugs or products that contain nicotine and tobacco, and limiting alcohol use. What can I expect for my preventive care visit? Physical exam Your health care provider will check:  Height and weight. These may be used to calculate body mass index (BMI), which is a measurement that tells if you are at a healthy weight.  Heart rate and blood pressure.  Your skin for abnormal spots. Counseling Your health care provider may ask you questions about:  Alcohol, tobacco, and drug use.  Emotional well-being.  Home and relationship well-being.  Sexual activity.  Eating habits.  Work and work eStatistician What immunizations do I need?  Influenza (flu) vaccine  This is recommended every year. Tetanus, diphtheria, and pertussis (Tdap) vaccine  You may need a Td booster every 10 years. Varicella (chickenpox) vaccine  You may need this vaccine if you have not already been vaccinated. Zoster (shingles) vaccine  You may need this after age 4747 Measles, mumps, and rubella (MMR) vaccine  You may need at least one dose of MMR if you were born in 1957 or later. You may also need a second dose. Pneumococcal conjugate (PCV13) vaccine  You may need this if you have certain conditions and were not previously vaccinated. Pneumococcal polysaccharide (PPSV23) vaccine  You may need one or two doses if you smoke cigarettes or if you have  certain conditions. Meningococcal conjugate (MenACWY) vaccine  You may need this if you have certain conditions. Hepatitis A vaccine  You may need this if you have certain conditions or if you travel or work in places where you may be exposed to hepatitis A. Hepatitis B vaccine  You may need this if you have certain conditions or if you travel or work in places where you may be exposed to hepatitis B. Haemophilus influenzae type b (Hib) vaccine  You may need this if you have certain risk factors. Human papillomavirus (HPV) vaccine  If recommended by your health care provider, you may need three doses over 6 months. You may receive vaccines as individual doses or as more than one vaccine together in one shot (combination vaccines). Talk with your health care provider about the risks and benefits of combination vaccines. What tests do I need? Blood tests  Lipid and cholesterol levels. These may be checked every 5 years, or more frequently if you are over 475years old.  Hepatitis C test.  Hepatitis B test. Screening  Lung cancer screening. You may have this screening every year starting at age 4747if you have a 30-pack-year history of smoking and currently smoke or have quit within the past 15 years.  Prostate cancer screening. Recommendations will vary depending on your family history and other risks.  Colorectal cancer screening. All adults should have this screening starting at age 22126and continuing until age 47 Your health care provider may recommend screening at age 4749if you are at increased risk. You will have tests every 1-10 years, depending on your  results and the type of screening test.  Diabetes screening. This is done by checking your blood sugar (glucose) after you have not eaten for a while (fasting). You may have this done every 1-3 years.  Sexually transmitted disease (STD) testing. Follow these instructions at home: Eating and drinking  Eat a diet that includes  fresh fruits and vegetables, whole grains, lean protein, and low-fat dairy products.  Take vitamin and mineral supplements as recommended by your health care provider.  Do not drink alcohol if your health care provider tells you not to drink.  If you drink alcohol: ? Limit how much you have to 0-2 drinks a day. ? Be aware of how much alcohol is in your drink. In the [place] one drink equals one 12 oz bottle of beer (355 mL), one 5 oz glass of wine (148 mL), or one 1 oz glass of hard liquor (44 mL). Lifestyle  Take daily care of your teeth and gums.  Stay active. Exercise for at least 30 minutes on 5 or more days each week.  Do not use any products that contain nicotine or tobacco, such as cigarettes, e-cigarettes, and chewing tobacco. If you need help quitting, ask your health care provider.  If you are sexually active, practice safe sex. Use a condom or other form of protection to prevent STIs (sexually transmitted infections).  Talk with your health care provider about taking a low-dose aspirin every day starting at age 47. What's next?  Go to your health care provider once a year for a well check visit.  Ask your health care provider how often you should have your eyes and teeth checked.  Stay up to date on all vaccines. This information is not intended to replace advice given to you by your health care provider. Make sure you discuss any questions you have with your health care provider. Document Revised: 01/31/2018 Document Reviewed: 01/31/2018 Elsevier Patient Education  2020 [employer].

## 2020-01-12 NOTE — Progress Notes (Signed)
Subjective:    Patient ID: Devin [name], male    DOB: 12-27-1970, 49 y.o.   MRN: [id number]  HPI  Patient presents today for complete physical.  Immunizations:Tdap due , flu shot up to date. Waiting for Covid shot had monoclonal antibody infusion 9/18.  Diet:  Fair diet.  Plans to go back to Keto after Thanksgiving Wt Readings from Last 3 Encounters:  01/12/20 237 lb (107.5 kg)  11/26/19 231 lb 4 oz (104.9 kg)  09/23/19 232 lb 6 oz (105.4 kg)  Exercise: no formal exercise (some left knee pain).  Colonoscopy: due Vision: up to date Dental:  Up to date    Review of Systems  Constitutional: Negative for unexpected weight change.  HENT: Negative for hearing loss and rhinorrhea.   Eyes: Negative for visual disturbance.  Respiratory: Negative for cough and shortness of breath.   Cardiovascular: Negative for chest pain (reports occasional quick pain on right).  Gastrointestinal: Positive for constipation. Negative for diarrhea.  Genitourinary: Negative for dysuria and frequency.  Musculoskeletal: Positive for arthralgias (left knee pain).  Skin: Negative for rash.  Neurological: Negative for headaches.  Hematological: Negative for adenopathy.  Psychiatric/Behavioral:       Denies depression/anxiety   Past Medical History:  Diagnosis Date  . History of MRSA infection   . Hypertension    controlled  . Keratoconus of both eyes      Social History   Socioeconomic History  . Marital status: Married    Spouse name: Not on file  . Number of children: 3  . Years of education: Not on file  . Highest education level: Not on file  Occupational History  . Occupation: Chief Strategy Officer: [employer]  Tobacco Use  . Smoking status: Never Smoker  . Smokeless tobacco: Never Used  Vaping Use  . Vaping Use: Never used  Substance and Sexual Activity  . Alcohol use: Yes    Comment: weekly  . Drug use: Never  . Sexual activity: Not on file  Other Topics Concern  .  Not on file  Social History Narrative   Regular exercise: yes (runs and uses elliptical machine)   Quit school in the 8th grade.   Social Determinants of Health   Financial Resource Strain:   . Difficulty of Paying Living Expenses: Not on file  Food Insecurity:   . Worried About Programme researcher, broadcasting/film/video in the Last Year: Not on file  . Ran Out of Food in the Last Year: Not on file  Transportation Needs:   . Lack of Transportation (Medical): Not on file  . Lack of Transportation (Non-Medical): Not on file  Physical Activity:   . Days of Exercise per Week: Not on file  . Minutes of Exercise per Session: Not on file  Stress:   . Feeling of Stress : Not on file  Social Connections:   . Frequency of Communication with Friends and Family: Not on file  . Frequency of Social Gatherings with Friends and Family: Not on file  . Attends Religious Services: Not on file  . Active Member of Clubs or Organizations: Not on file  . Attends Banker Meetings: Not on file  . Marital Status: Not on file  Intimate Partner Violence:   . Fear of Current or Ex-Partner: Not on file  . Emotionally Abused: Not on file  . Physically Abused: Not on file  . Sexually Abused: Not on file    Past Surgical History:  Procedure Laterality Date  . NECK SURGERY  02/03/14    Family History  Problem Relation Age of Onset  . Diabetes Mother   . Hyperlipidemia Mother   . Heart disease Mother        CHF  . Thyroid disease Mother        Graves Disease  . Hypertension Father   . Asthma Daughter   . ADD / ADHD Daughter     Allergies  Allergen Reactions  . Other Nausea And Vomiting    shrimp shrimp   . Penicillins Swelling    Has patient had a PCN reaction causing immediate rash, facial/tongue/throat swelling, SOB or lightheadedness with hypotension: Yes Has patient had a PCN reaction causing severe rash involving mucus membranes or skin necrosis: No Has patient had a PCN reaction that required  hospitalization: No Has patient had a PCN reaction occurring within the last 10 years: No If all of the above answers are "NO", then may proceed with Cephalosporin use. REACTION: causes rash and sob.  . Shellfish Allergy Nausea And Vomiting    shrimp    Current Outpatient Medications on File Prior to Visit  Medication Sig Dispense Refill  . propranolol (INDERAL) 80 MG tablet Take 80 mg by mouth 3 (three) times daily.     No current facility-administered medications on file prior to visit.    BP 135/67 (BP Location: Right Arm, Patient Position: Sitting, Cuff Size: Large)   Pulse (!) 45   Temp (!) 97.5 F (36.4 C) (Temporal)   Resp 16   Ht 5\' 10"  (1.778 m)   Wt 237 lb (107.5 kg)   SpO2 100%   BMI 34.01 kg/m       Objective:   Physical Exam  Physical Exam  Constitutional: He is oriented to person, place, and time. He appears well-developed and well-nourished. No distress.  HENT:  Head: Normocephalic and atraumatic.  Right Ear: Tympanic membrane and ear canal normal.  Left Ear: Tympanic membrane and ear canal normal.  Mouth/Throat: Not examined- pt wearing mask Eyes: Pupils are equal, round, and reactive to light. No scleral icterus.  Neck: Normal range of motion. No thyromegaly present.  Cardiovascular: Normal rate and regular rhythm.   No murmur heard. Pulmonary/Chest: Effort normal and breath sounds normal. No respiratory distress. He has no wheezes. He has no rales. He exhibits no tenderness.  Abdominal: Soft. Bowel sounds are normal. He exhibits no distension and no mass. There is no tenderness. There is no rebound and no guarding.  Musculoskeletal: He exhibits no edema.  Lymphadenopathy:    He has no cervical adenopathy.  Neurological: He is alert and oriented to person, place, and time.  He exhibits normal muscle tone. Coordination normal.  Skin: Skin is warm and dry.  Psychiatric: He has a normal mood and affect. His behavior is normal. Judgment and thought  content normal.           Assessment & Plan:  Preventative care-Tdap today.  Flu shot is up-to-date.  Covid vaccine is also up-to-date.  Recommended referral for colonoscopy and patient is agreeable.  We discussed healthy diet, exercise, and weight loss.  This visit occurred during the SARS-CoV-2 public health emergency.  Safety protocols were in place, including screening questions prior to the visit, additional usage of staff PPE, and extensive cleaning of exam room while observing appropriate contact time as indicated for disinfecting solutions.         Assessment & Plan:

## 2020-01-24 ENCOUNTER — Other Ambulatory Visit: Payer: Self-pay | Admitting: Family

## 2020-01-24 DIAGNOSIS — G252 Other specified forms of tremor: Secondary | ICD-10-CM

## 2020-01-26 MED ORDER — PROPRANOLOL HCL 80 MG PO TABS: 80.0000 mg | ORAL_TABLET | Freq: Three times a day (TID) | ORAL | 1 refills | Status: DC

## 2020-01-28 ENCOUNTER — Encounter: Payer: Self-pay | Admitting: Family

## 2020-01-29 MED ORDER — PROPRANOLOL HCL ER 80 MG PO CP24
80.0000 mg | ORAL_CAPSULE | Freq: Every day | ORAL | 0 refills | Status: DC
Start: 2020-01-29 — End: 2020-06-07

## 2020-03-12 ENCOUNTER — Other Ambulatory Visit: Payer: Self-pay | Admitting: Family Medicine

## 2020-03-12 DIAGNOSIS — T50Z95A Adverse effect of other vaccines and biological substances, initial encounter: Secondary | ICD-10-CM

## 2020-05-07 ENCOUNTER — Ambulatory Visit: Payer: BC Managed Care – PPO | Admitting: Family Medicine

## 2020-05-07 ENCOUNTER — Other Ambulatory Visit: Payer: Self-pay

## 2020-05-07 ENCOUNTER — Encounter: Payer: Self-pay | Admitting: Family Medicine

## 2020-05-07 VITALS — Temp 98.1°F | Resp 16 | Wt 242.6 lb

## 2020-05-07 DIAGNOSIS — R42 Dizziness and giddiness: Secondary | ICD-10-CM

## 2020-05-07 DIAGNOSIS — E669 Obesity, unspecified: Secondary | ICD-10-CM | POA: Diagnosis not present

## 2020-05-07 LAB — COMPREHENSIVE METABOLIC PANEL
ALT: 12 U/L (ref 0–53)
AST: 11 U/L (ref 0–37)
Albumin: 4.3 g/dL (ref 3.5–5.2)
Alkaline Phosphatase: 48 U/L (ref 39–117)
BUN: 13 mg/dL (ref 6–23)
CO2: 28 mEq/L (ref 19–32)
Calcium: 9.8 mg/dL (ref 8.4–10.5)
Chloride: 105 mEq/L (ref 96–112)
Creatinine, Ser: 0.93 mg/dL (ref 0.40–1.50)
GFR: 97.91 mL/min (ref >60.00)
Glucose, Bld: 116 mg/dL — ABNORMAL HIGH (ref 70–99)
Potassium: 4.7 mEq/L (ref 3.5–5.1)
Sodium: 140 mEq/L (ref 135–145)
Total Bilirubin: 0.5 mg/dL (ref 0.2–1.2)
Total Protein: 6.8 g/dL (ref 6.0–8.3)

## 2020-05-07 LAB — CBC
HCT: 39.3 % (ref 39.0–52.0)
Hemoglobin: 13.6 g/dL (ref 13.0–17.0)
MCHC: 34.7 g/dL (ref 30.0–36.0)
MCV: 88.9 fl (ref 78.0–100.0)
Platelets: 271 10*3/uL (ref 150.0–400.0)
RBC: 4.42 Mil/uL (ref 4.22–5.81)
RDW: 13.4 % (ref 11.5–15.5)
WBC: 6.5 10*3/uL (ref 4.0–10.5)

## 2020-05-07 NOTE — Progress Notes (Signed)
Chief Complaint  Patient presents with  . Dizziness    Pt states that it has sometimes, it happened about 3 this week.    Subjective: Patient is a 50 y.o. male here for lightheadedness.  Last week, the patient had 3 episodes where he stood up and got lightheaded.  He did not pass out but felt like he might.  He was not spinning.  He drinks at least 64 ounces of water daily.  He does not sweat excessively.  He denies any tarry stools, nausea, vomiting, diarrhea, or blood in his stool/urine.  He does partake in intermittent fasting where he will eat 1 meal daily.  He does increase water intake throughout the day to compensate for no food intake.  Obesity The patient has been eating healthy and intermittent fasting.  He is having trouble with his weight and is wondering if the weight loss clinic would help.  Past Medical History:  Diagnosis Date  . History of MRSA infection   . Hypertension    controlled  . Keratoconus of both eyes     Objective: BP 110/68 Temp 98.1 F (36.7 C)   Resp 16   Wt 242 lb 9.6 oz (110 kg)   SpO2 98%   BMI 34.81 kg/m  General: Awake, appears stated age HEENT: MMM, EOMi Heart: RRR, no murmurs, no bruits, no lower extremity edema Lungs: CTAB, no rales, wheezes or rhonchi. No accessory muscle use Psych: Age appropriate judgment and insight, normal affect and mood  Assessment and Plan: Lightheadedness - Plan: CBC, Comprehensive metabolic panel  Obesity (BMI 30-39.9) - Plan: Amb Ref to Medical Weight Management  1.  Check labs, orthostatics negative, could be slight dehydration/hypoglycemia.  Might needed it more routinely.  Could trial midodrine.  Get up slowly. 2.  Counseled on diet and exercise.  Refer to the medical weight loss team. The patient voiced understanding and agreement to the plan.  [name], DO 05/07/20  11:58 AM

## 2020-05-07 NOTE — Patient Instructions (Signed)
Give [name] 2-3 business days to get the results of your labs back.   If you do not hear anything about your referral in the next 1-2 weeks, call our office and ask for an update.  Stay hydrated.  Get up slowly.  Let us know if you need anything.

## 2020-06-04 ENCOUNTER — Other Ambulatory Visit: Payer: Self-pay | Admitting: Family

## 2020-06-10 ENCOUNTER — Encounter (INDEPENDENT_AMBULATORY_CARE_PROVIDER_SITE_OTHER): Payer: Self-pay | Admitting: Family Medicine

## 2020-06-10 ENCOUNTER — Ambulatory Visit (INDEPENDENT_AMBULATORY_CARE_PROVIDER_SITE_OTHER): Payer: BC Managed Care – PPO | Admitting: Family Medicine

## 2020-06-10 ENCOUNTER — Other Ambulatory Visit: Payer: Self-pay

## 2020-06-10 ENCOUNTER — Ambulatory Visit (INDEPENDENT_AMBULATORY_CARE_PROVIDER_SITE_OTHER): Payer: Self-pay | Admitting: Bariatrics

## 2020-06-10 VITALS — BP 138/63 | HR 42 | Temp 97.6°F | Ht 68.0 in | Wt 239.0 lb

## 2020-06-10 DIAGNOSIS — R948 Abnormal results of function studies of other organs and systems: Secondary | ICD-10-CM | POA: Diagnosis not present

## 2020-06-10 DIAGNOSIS — R5383 Other fatigue: Secondary | ICD-10-CM

## 2020-06-10 DIAGNOSIS — Z1331 Encounter for screening for depression: Secondary | ICD-10-CM

## 2020-06-10 DIAGNOSIS — I480 Paroxysmal atrial fibrillation: Secondary | ICD-10-CM

## 2020-06-10 DIAGNOSIS — E65 Localized adiposity: Secondary | ICD-10-CM | POA: Diagnosis not present

## 2020-06-10 DIAGNOSIS — R7301 Impaired fasting glucose: Secondary | ICD-10-CM

## 2020-06-10 DIAGNOSIS — R0602 Shortness of breath: Secondary | ICD-10-CM | POA: Diagnosis not present

## 2020-06-10 DIAGNOSIS — Z6836 Body mass index (BMI) 36.0-36.9, adult: Secondary | ICD-10-CM

## 2020-06-10 DIAGNOSIS — G4709 Other insomnia: Secondary | ICD-10-CM

## 2020-06-10 DIAGNOSIS — G252 Other specified forms of tremor: Secondary | ICD-10-CM

## 2020-06-10 DIAGNOSIS — Z9189 Other specified personal risk factors, not elsewhere classified: Secondary | ICD-10-CM | POA: Diagnosis not present

## 2020-06-10 DIAGNOSIS — I10 Essential (primary) hypertension: Secondary | ICD-10-CM | POA: Diagnosis not present

## 2020-06-10 DIAGNOSIS — Z0289 Encounter for other administrative examinations: Secondary | ICD-10-CM

## 2020-06-11 LAB — LIPID PANEL WITH LDL/HDL RATIO
Cholesterol, Total: 168 mg/dL (ref 100–199)
HDL: 58 mg/dL (ref >39)
LDL Chol Calc (NIH): 93 mg/dL (ref 0–99)
LDL/HDL Ratio: 1.6 ratio (ref 0.0–3.6)
Triglycerides: 91 mg/dL (ref 0–149)
VLDL Cholesterol Cal: 17 mg/dL (ref 5–40)

## 2020-06-11 LAB — VITAMIN B12: Vitamin B-12: 496 pg/mL (ref 232–1245)

## 2020-06-11 LAB — COMPREHENSIVE METABOLIC PANEL
ALT: 13 IU/L (ref 0–44)
AST: 15 IU/L (ref 0–40)
Albumin/Globulin Ratio: 1.7 (ref 1.2–2.2)
Albumin: 4.6 g/dL (ref 4.0–5.0)
Alkaline Phosphatase: 67 IU/L (ref 44–121)
BUN/Creatinine Ratio: 14 (ref 9–20)
BUN: 12 mg/dL (ref 6–24)
Bilirubin Total: 0.4 mg/dL (ref 0.0–1.2)
CO2: 20 mmol/L (ref 20–29)
Calcium: 9.2 mg/dL (ref 8.7–10.2)
Chloride: 100 mmol/L (ref 96–106)
Creatinine, Ser: 0.87 mg/dL (ref 0.76–1.27)
Globulin, Total: 2.7 g/dL (ref 1.5–4.5)
Glucose: 86 mg/dL (ref 65–99)
Potassium: 4 mmol/L (ref 3.5–5.2)
Sodium: 141 mmol/L (ref 134–144)
Total Protein: 7.3 g/dL (ref 6.0–8.5)
eGFR: 107 mL/min/{1.73_m2} (ref >59)

## 2020-06-11 LAB — T4, FREE: Free T4: 1.41 ng/dL (ref 0.82–1.77)

## 2020-06-11 LAB — VITAMIN D 25 HYDROXY (VIT D DEFICIENCY, FRACTURES): Vit D, 25-Hydroxy: 36.9 ng/mL (ref 30.0–100.0)

## 2020-06-11 LAB — HEMOGLOBIN A1C
Est. average glucose Bld gHb Est-mCnc: 97 mg/dL
Hgb A1c MFr Bld: 5 % (ref 4.8–5.6)

## 2020-06-11 LAB — TSH: TSH: 2.05 u[IU]/mL (ref 0.450–4.500)

## 2020-06-11 LAB — INSULIN, RANDOM: INSULIN: 5.5 u[IU]/mL (ref 2.6–24.9)

## 2020-06-14 NOTE — Progress Notes (Signed)
Chief Complaint:   OBESITY Devin [name] (MR# [id number]) is a 50 y.o. male who presents for evaluation and treatment of obesity and related comorbidities. Current BMI is Body mass index is 36.34 kg/m. Devin [name] has been struggling with his weight for many years and has been unsuccessful in either losing weight, maintaining weight loss, or reaching his healthy weight goal.  Devin [name] is currently in the action stage of change and ready to dedicate time achieving and maintaining a healthier weight. Devin [name] is interested in becoming our patient and working on intensive lifestyle modifications including (but not limited to) diet and exercise for weight loss.  Devin [name] has insomnia.  Question GAD.  Drinks alcohol 1-2 times per week, usually a 12-pack.  Has history of being on Cymbalta.  Question Lexapro.  [name]'s habits were reviewed today and are as follows: His family eats meals together, he thinks his family will eat healthier with him, his desired weight loss is 37 pounds, he has been heavy most of his life, he started gaining weight at 50 years of age, his heaviest weight ever was 286 pounds, he skips breakfast frequently, he is frequently drinking liquids with calories, he frequently makes poor food choices, he frequently eats larger portions than normal and he struggles with emotional eating.  Depression Screen Devin [name]'s Food and Mood (modified PHQ-9) score was 7.  Depression screen [facility] 2/9 06/10/2020  Decreased Interest 0  Down, Depressed, Hopeless 0  PHQ - 2 Score 0  Altered sleeping 3  Tired, decreased energy 2  Change in appetite 1  Feeling bad or failure about yourself  0  Trouble concentrating 1  Moving slowly or fidgety/restless 0  Suicidal thoughts 0  PHQ-9 Score 7  Difficult doing work/chores Not difficult at all   Assessment/Plan:   1. Other fatigue [name] denies daytime somnolence and reports waking up still tired. Patent has a history of symptoms of morning fatigue and  snoring. Devin [name] generally gets 4 or 5 hours of sleep per night, and states that he has poor quality sleep. Snoring is present. Apneic episodes are not present. Epworth Sleepiness Score is 7.  Manly does feel that his weight is causing his energy to be lower than it should be. Fatigue may be related to obesity, depression or many other causes. Labs will be ordered, and in the meanwhile, [facility] will focus on self care including making healthy food choices, increasing physical activity and focusing on stress reduction.  - EKG 12-Lead - T4, free - TSH - Vitamin B12 - VITAMIN D 25 Hydroxy (Vit-D Deficiency, Fractures)  2. SOB (shortness of breath) on exertion Devin [name] notes increasing shortness of breath with exercising and seems to be worsening over time with weight gain. He notes getting out of breath sooner with activity than he used to. This has gotten worse recently. Devin [name] denies shortness of breath at rest or orthopnea.  Devin [name] does feel that he gets out of breath more easily that he used to when he exercises. Devin [name]'s shortness of breath appears to be obesity related and exercise induced. He has agreed to work on weight loss and gradually increase exercise to treat his exercise induced shortness of breath. Will continue to monitor closely.  3. Visceral obesity Current visceral fat rating: 19. Visceral fat rating should be < 13. Visceral adipose tissue is a hormonally active component of total body fat. This body composition phenotype is associated with medical disorders such as metabolic syndrome, cardiovascular disease and several malignancies including prostate,  breast, and colorectal cancers. Starting goal: Lose 7-10% of starting weight.   4. Abnormal metabolism, slow Devin [name]'s metabolism is slower than normal.  Plan:  Will check labs today, as per below.  - Lipid Panel With LDL/HDL Ratio - T4, free - TSH - Vitamin B12 - VITAMIN D 25 Hydroxy (Vit-D Deficiency, Fractures)  5. Essential  hypertension At goal. Medications: propranolol ER 80 mg daily (also for tremor).   Plan: Avoid buying foods that are: processed, frozen, or prepackaged to avoid excess salt. We will continue to monitor closely alongside his PCP and/or Specialist.  Regular follow up with PCP and specialists was also encouraged.   BP Readings from Last 3 Encounters:  06/10/20 138/63  01/12/20 135/67  11/26/19 130/76   Lab Results  Component Value Date   CREATININE 0.87 06/10/2020   - Lipid Panel With LDL/HDL Ratio - T4, free - TSH  6. Paroxysmal atrial fibrillation (HCC) Devin [name] has had one episode of paroxysmal a-fib.  He had workup with Cardiology and will follow-up with them as needed.  7. Intention tremor Dagan is taking propranolol ER 80 mg daily for tremor.  8. Fasting hyperglycemia Devin [name] has a history of some elevated blood glucose readings without a diagnosis of diabetes.   Plan:  Check CMP, A1c, and insulin level today.  - Comprehensive metabolic panel - Hemoglobin A1c - Insulin, random  9. Other insomnia This is poorly controlled. Average hours of sleep per night: 4-5.  He only sleeps a few hours at a time. Current treatment: None.  Plan: Recommend sleep hygiene measures including regular sleep schedule, optimal sleep environment, and relaxing presleep rituals.   10. Depression screening Devin [name] was screened for depression as part of his new patient workup.  PHQ-9 is 7.  Devin [name] had a positive depression screening. Depression is commonly associated with obesity and often results in emotional eating behaviors. We will monitor this closely and work on CBT to help improve the non-hunger eating patterns. Referral to Psychology may be required if no improvement is seen as he continues in our clinic.  11. At risk for heart disease Due to Devin [name]'s current state of health and medical condition(s), he is at a higher risk for heart disease.  This puts the patient at much greater risk to  subsequently develop cardiopulmonary conditions that can significantly affect patient's quality of life in a negative manner.    At least 9 minutes were spent on counseling Devin [name] about these concerns today. Evidence-based interventions for health behavior change were utilized today including the discussion of self monitoring techniques, problem-solving barriers, and SMART goal setting techniques.  Specifically, regarding patient's less desirable eating habits and patterns, we employed the technique of small changes when Khole has not been able to fully commit to his prudent nutritional plan.  12. Class 2 severe obesity with serious comorbidity and body mass index (BMI) of 36.0 to 36.9 in adult, unspecified obesity type [facility])  Ander is currently in the action stage of change and his goal is to continue with weight loss efforts. I recommend Rylen begin the structured treatment plan as follows:  He has agreed to keeping a food journal and adhering to recommended goals of 1300 calories and 95 grams of protein.  Exercise goals: No exercise has been prescribed at this time.   Behavioral modification strategies: increasing lean protein intake, decreasing simple carbohydrates, increasing vegetables, increasing water intake, decreasing liquid calories, decreasing alcohol intake, decreasing sodium intake and increasing high fiber foods.  He was informed of  the importance of frequent follow-up visits to maximize his success with intensive lifestyle modifications for his multiple health conditions. He was informed we would discuss his lab results at his next visit unless there is a critical issue that needs to be addressed sooner. Yuriy agreed to keep his next visit at the agreed upon time to discuss these results.  Objective:   Blood pressure 138/63, pulse (!) 42, temperature 97.6 F (36.4 C), temperature source Oral, height 5\' 8"  (1.727 m), weight 239 lb (108.4 kg), SpO2 100 %. Body mass index is 36.34  kg/m.  EKG: Bradycardia, sinus rhythm, rate 43 bpm.  Indirect Calorimeter completed today shows a VO2 of 262 and a REE of 1826.  His calculated basal metabolic rate is thus his basal metabolic rate is worse than expected.  General: Cooperative, alert, well developed, in no acute distress. HEENT: Conjunctivae and lids unremarkable. Cardiovascular: Regular rhythm.  Lungs: Normal work of breathing. Neurologic: No focal deficits.   Lab Results  Component Value Date   CREATININE 0.87 06/10/2020   BUN 12 06/10/2020   NA 141 06/10/2020   K 4.0 06/10/2020   CL 100 06/10/2020   CO2 20 06/10/2020   Lab Results  Component Value Date   ALT 13 06/10/2020   AST 15 06/10/2020   ALKPHOS 67 06/10/2020   BILITOT 0.4 06/10/2020   Lab Results  Component Value Date   HGBA1C 5.0 06/10/2020   Lab Results  Component Value Date   INSULIN 5.5 06/10/2020   Lab Results  Component Value Date   TSH 2.050 06/10/2020   Lab Results  Component Value Date   CHOL 168 06/10/2020   HDL 58 06/10/2020   LDLCALC 93 06/10/2020   TRIG 91 06/10/2020   Lab Results  Component Value Date   WBC 6.5 05/07/2020   HGB 13.6 05/07/2020   HCT 39.3 05/07/2020   MCV 88.9 05/07/2020   PLT 271.0 05/07/2020   Attestation Statements:   This is the patient's first visit at [facility]. The patient's NEW PATIENT PACKET was reviewed at length. Included in the packet: current and past health history, medications, allergies, ROS, gynecologic history (women only), surgical history, family history, social history, weight history, weight loss surgery history (for those that have had weight loss surgery), nutritional evaluation, mood and food questionnaire, PHQ9, Epworth questionnaire, sleep habits questionnaire, patient life and health improvement goals questionnaire. These will all be scanned into the patient's chart under media.   During the visit, I independently reviewed the patient's EKG,  bioimpedance scale results, and indirect calorimeter results. I used this information to tailor a meal plan for the patient that will help him to lose weight and will improve his obesity-related conditions going forward. I performed a medically necessary appropriate examination and/or evaluation. I discussed the assessment and treatment plan with the patient. The patient was provided an opportunity to ask questions and all were answered. The patient agreed with the plan and demonstrated an understanding of the instructions. Labs were ordered at this visit and will be reviewed at the next visit unless more critical results need to be addressed immediately. Clinical information was updated and documented in the EMR.   I, 05/09/2020, CMA, am acting as transcriptionist for Insurance claims handler, DO  I have reviewed the above documentation for accuracy and completeness, and I agree with the above. Helane Rima, DO

## 2020-06-24 ENCOUNTER — Ambulatory Visit (INDEPENDENT_AMBULATORY_CARE_PROVIDER_SITE_OTHER): Payer: BC Managed Care – PPO | Admitting: Family Medicine

## 2020-06-24 ENCOUNTER — Encounter (INDEPENDENT_AMBULATORY_CARE_PROVIDER_SITE_OTHER): Payer: Self-pay | Admitting: Family Medicine

## 2020-06-24 ENCOUNTER — Other Ambulatory Visit: Payer: Self-pay

## 2020-06-24 ENCOUNTER — Ambulatory Visit (INDEPENDENT_AMBULATORY_CARE_PROVIDER_SITE_OTHER): Payer: Self-pay | Admitting: Bariatrics

## 2020-06-24 VITALS — BP 108/68 | HR 44 | Temp 98.0°F | Ht 68.0 in | Wt 229.0 lb

## 2020-06-24 DIAGNOSIS — Z9189 Other specified personal risk factors, not elsewhere classified: Secondary | ICD-10-CM | POA: Diagnosis not present

## 2020-06-24 DIAGNOSIS — R001 Bradycardia, unspecified: Secondary | ICD-10-CM

## 2020-06-24 DIAGNOSIS — E65 Localized adiposity: Secondary | ICD-10-CM

## 2020-06-24 DIAGNOSIS — I480 Paroxysmal atrial fibrillation: Secondary | ICD-10-CM

## 2020-06-24 DIAGNOSIS — G250 Essential tremor: Secondary | ICD-10-CM | POA: Diagnosis not present

## 2020-06-24 DIAGNOSIS — Z6836 Body mass index (BMI) 36.0-36.9, adult: Secondary | ICD-10-CM

## 2020-06-24 MED ORDER — BLOOD PRESSURE MONITOR/L CUFF MISC: 0 refills | Status: DC

## 2020-07-01 NOTE — Progress Notes (Signed)
Chief Complaint:   OBESITY Devin [name] is here to discuss his progress with his obesity treatment plan along with follow-up of his obesity related diagnoses.   Today's visit was #: 2 Starting weight: 239 lbs Starting date: 06/10/2020 Today's weight: 229 lbs Today's date: 06/24/2020 Weight change since last visit: 10 lbs Total lbs lost to date: 10 lbs Body mass index is 34.82 kg/m.  Total weight loss percentage to date: -4.18%  Interim History:  Devin [name] is getting in 600-800 calories per day. Current Meal Plan: keeping a food journal and adhering to recommended goals of 1300 calories and 95 grams of protein for 0% of the time.  Current Exercise Plan: None.  Assessment/Plan:   Orders Placed This Encounter  Procedures  . Ambulatory referral to Neurology   Meds ordered this encounter  Medications  . Blood Pressure Monitoring (BLOOD PRESSURE MONITOR/L CUFF) MISC    Sig: Use to monitor blood pressure    Dispense:  1 each    Refill:  0    1. Bradycardia Will send in blood pressure monitor and supplies.  Encourage home blood pressure monitoring.  - Blood Pressure Monitoring (BLOOD PRESSURE MONITOR/L CUFF) MISC; Use to monitor blood pressure  Dispense: 1 each; Refill: 0  2. Essential tremor Will place referral to [facility], Dr. Arbutus Leas.  He is taking propranolol ER 80 mg daily but feels that the side effects are starting to outweigh the benefits.  - Ambulatory referral to Neurology  3. Paroxysmal atrial fibrillation (HCC) Aidenn has been worked up by Cardiology in the past. One episode only.   4. Visceral obesity Current visceral fat rating: 17. Visceral fat rating should be < 13. Visceral adipose tissue is a hormonally active component of total body fat. This body composition phenotype is associated with medical disorders such as metabolic syndrome, cardiovascular disease and several malignancies including prostate, breast, and colorectal cancers. Starting goal: Lose 7-10% of  starting weight.   5. At risk for heart disease Due to Mont's current state of health and medical condition(s), he is at a higher risk for heart disease.  This puts the patient at much greater risk to subsequently develop cardiopulmonary conditions that can significantly affect patient's quality of life in a negative manner.    At least 8 minutes were spent on counseling Devin [name] about these concerns today. Evidence-based interventions for health behavior change were utilized today including the discussion of self monitoring techniques, problem-solving barriers, and SMART goal setting techniques.  Specifically, regarding patient's less desirable eating habits and patterns, we employed the technique of small changes when Devin [name] has not been able to fully commit to his prudent nutritional plan.  6. Obesity, current BMI 34.8  Course: Devin [name] is currently in the action stage of change. As such, his goal is to continue with weight loss efforts.   Nutrition goals: He has agreed to keeping a food journal and adhering to recommended goals of 1300 calories and 95 grams of protein.   Exercise goals: Increased activity.  Behavioral modification strategies: increasing lean protein intake, increasing water intake and increasing high fiber foods.  Devin [name] has agreed to follow-up with our clinic in 2-3 weeks. He was informed of the importance of frequent follow-up visits to maximize his success with intensive lifestyle modifications for his multiple health conditions.   Objective:   Blood pressure 108/68, pulse (!) 44, temperature 98 F (36.7 C), temperature source Oral, height 5\' 8"  (1.727 m), weight 229 lb (103.9 kg), SpO2 98 %. Body  mass index is 34.82 kg/m.  General: Cooperative, alert, well developed, in no acute distress. HEENT: Conjunctivae and lids unremarkable. Cardiovascular: Regular rhythm.  Lungs: Normal work of breathing. Neurologic: No focal deficits.   Lab Results  Component Value Date    CREATININE 0.87 06/10/2020   BUN 12 06/10/2020   NA 141 06/10/2020   K 4.0 06/10/2020   CL 100 06/10/2020   CO2 20 06/10/2020   Lab Results  Component Value Date   ALT 13 06/10/2020   AST 15 06/10/2020   ALKPHOS 67 06/10/2020   BILITOT 0.4 06/10/2020   Lab Results  Component Value Date   HGBA1C 5.0 06/10/2020   Lab Results  Component Value Date   INSULIN 5.5 06/10/2020   Lab Results  Component Value Date   TSH 2.050 06/10/2020   Lab Results  Component Value Date   CHOL 168 06/10/2020   HDL 58 06/10/2020   LDLCALC 93 06/10/2020   TRIG 91 06/10/2020   Lab Results  Component Value Date   WBC 6.5 05/07/2020   HGB 13.6 05/07/2020   HCT 39.3 05/07/2020   MCV 88.9 05/07/2020   PLT 271.0 05/07/2020   Attestation Statements:   Reviewed by clinician on day of visit: allergies, medications, problem list, medical history, surgical history, family history, social history, and previous encounter notes.  I, Insurance claims handler, CMA, am acting as transcriptionist for Helane Rima, DO  I have reviewed the above documentation for accuracy and completeness, and I agree with the above. Helane Rima, DO

## 2020-07-12 ENCOUNTER — Ambulatory Visit: Payer: BC Managed Care – PPO | Admitting: Family

## 2020-07-12 ENCOUNTER — Ambulatory Visit (INDEPENDENT_AMBULATORY_CARE_PROVIDER_SITE_OTHER): Payer: BC Managed Care – PPO | Admitting: Physician Assistant

## 2020-07-13 ENCOUNTER — Encounter (INDEPENDENT_AMBULATORY_CARE_PROVIDER_SITE_OTHER): Payer: Self-pay | Admitting: Physician Assistant

## 2020-07-13 ENCOUNTER — Ambulatory Visit (INDEPENDENT_AMBULATORY_CARE_PROVIDER_SITE_OTHER): Payer: BC Managed Care – PPO | Admitting: Physician Assistant

## 2020-07-13 ENCOUNTER — Other Ambulatory Visit: Payer: Self-pay

## 2020-07-13 VITALS — BP 116/74 | HR 47 | Temp 98.1°F | Ht 68.0 in | Wt 231.0 lb

## 2020-07-13 DIAGNOSIS — E559 Vitamin D deficiency, unspecified: Secondary | ICD-10-CM

## 2020-07-13 DIAGNOSIS — Z6835 Body mass index (BMI) 35.0-35.9, adult: Secondary | ICD-10-CM

## 2020-07-13 DIAGNOSIS — Z9189 Other specified personal risk factors, not elsewhere classified: Secondary | ICD-10-CM

## 2020-07-13 MED ORDER — VITAMIN D (ERGOCALCIFEROL) 1.25 MG (50000 UNIT) PO CAPS: 50000.0000 [IU] | ORAL_CAPSULE | ORAL | 0 refills | Status: DC

## 2020-07-13 NOTE — Progress Notes (Signed)
Chief Complaint:   OBESITY Devin [name] is here to discuss his progress with his obesity treatment plan along with follow-up of his obesity related diagnoses. Devin [name] is on keeping a food journal and adhering to recommended goals of 1300 calories and 95 grams of protein daily and states he is following his eating plan approximately 100% of the time. Devin [name] states he is doing 0 minutes 0 times per week.  Today's visit was #: 3 Starting weight: 239 lbs Starting date: 06/10/2020 Today's weight: 231 lbs Today's date: 07/13/2020 Total lbs lost to date: 8 Total lbs lost since last in-office visit: 0  Interim History: Devin [name] reports that he only eats once daily. Some days he gets hungry around 3 pm, but he does not eat. He is used to fasting all day and he sometimes will drink a protein shake prior to dinner. He is willing to start eating lunch.  Subjective:   1. Vitamin D deficiency Devin [name] is not on medications, and his last Vit D level was 36.9. I discussed labs with the patient today.  2. At risk for impaired metabolic function Devin [name] is at increased risk for impaired metabolic function due to current nutrition and muscle mass.  Assessment/Plan:   1. Vitamin D deficiency Low Vitamin D level contributes to fatigue and are associated with obesity, breast, and colon cancer. We will refill prescription Vitamin D 50,000 IU every week for 1 month. Devin [name] will follow-up for routine testing of Vitamin D, at least 2-3 times per year to avoid over-replacement.  - Vitamin D, Ergocalciferol, (DRISDOL) 1.25 MG (50000 UNIT) CAPS capsule; Take 1 capsule (50,000 Units total) by mouth every 7 (seven) days.  Dispense: 4 capsule; Refill: 0  2. At risk for impaired metabolic function Devin [name] was given approximately 15 minutes of impaired  metabolic function prevention counseling today. We discussed intensive lifestyle modifications today with an emphasis on specific nutrition and exercise instructions and strategies.    Repetitive spaced learning was employed today to elicit superior memory formation and behavioral change.  3. BMI 35.0-35.9,adult Devin [name] is currently in the action stage of change. As such, his goal is to continue with weight loss efforts. He has agreed to keeping a food journal and adhering to recommended goals of 1300 calories and 95 grams of protein daily.   Exercise goals: No exercise has been prescribed at this time.  Behavioral modification strategies: no skipping meals, meal planning and cooking strategies and keeping healthy foods in the home.  Devin [name] has agreed to follow-up with our clinic in 3 weeks. He was informed of the importance of frequent follow-up visits to maximize his success with intensive lifestyle modifications for his multiple health conditions.   Objective:   Blood pressure 116/74, pulse (!) 47, temperature 98.1 F (36.7 C), height 5\' 8"  (1.727 m), weight 231 lb (104.8 kg), SpO2 98 %. Body mass index is 35.12 kg/m.  General: Cooperative, alert, well developed, in no acute distress. HEENT: Conjunctivae and lids unremarkable. Cardiovascular: Regular rhythm.  Lungs: Normal work of breathing. Neurologic: No focal deficits.   Lab Results  Component Value Date   CREATININE 0.87 06/10/2020   BUN 12 06/10/2020   NA 141 06/10/2020   K 4.0 06/10/2020   CL 100 06/10/2020   CO2 20 06/10/2020   Lab Results  Component Value Date   ALT 13 06/10/2020   AST 15 06/10/2020   ALKPHOS 67 06/10/2020   BILITOT 0.4 06/10/2020   Lab Results  Component Value Date  HGBA1C 5.0 06/10/2020   Lab Results  Component Value Date   INSULIN 5.5 06/10/2020   Lab Results  Component Value Date   TSH 2.050 06/10/2020   Lab Results  Component Value Date   CHOL 168 06/10/2020   HDL 58 06/10/2020   LDLCALC 93 06/10/2020   TRIG 91 06/10/2020   Lab Results  Component Value Date   WBC 6.5 05/07/2020   HGB 13.6 05/07/2020   HCT 39.3 05/07/2020   MCV 88.9 05/07/2020   PLT  271.0 05/07/2020   No results found for: IRON, TIBC, FERRITIN  Attestation Statements:   Reviewed by clinician on day of visit: allergies, medications, problem list, medical history, surgical history, family history, social history, and previous encounter notes.   Trude Mcburney, am acting as transcriptionist for [employer], PA-C.  I have reviewed the above documentation for accuracy and completeness, and I agree with the above. Alois Cliche, PA-C

## 2020-07-21 ENCOUNTER — Encounter (HOSPITAL_BASED_OUTPATIENT_CLINIC_OR_DEPARTMENT_OTHER): Payer: Self-pay | Admitting: Emergency Medicine

## 2020-07-21 ENCOUNTER — Emergency Department (HOSPITAL_BASED_OUTPATIENT_CLINIC_OR_DEPARTMENT_OTHER): Payer: BC Managed Care – PPO

## 2020-07-21 ENCOUNTER — Emergency Department (HOSPITAL_BASED_OUTPATIENT_CLINIC_OR_DEPARTMENT_OTHER)
Admission: EM | Admit: 2020-07-21 | Discharge: 2020-07-21 | Disposition: A | Discharge status: Home / Self Care | Payer: BC Managed Care – PPO | Attending: Emergency Medicine | Admitting: Emergency Medicine

## 2020-07-21 ENCOUNTER — Other Ambulatory Visit: Payer: Self-pay

## 2020-07-21 DIAGNOSIS — Z7901 Long term (current) use of anticoagulants: Secondary | ICD-10-CM | POA: Insufficient documentation

## 2020-07-21 DIAGNOSIS — Z79899 Other long term (current) drug therapy: Secondary | ICD-10-CM | POA: Diagnosis not present

## 2020-07-21 DIAGNOSIS — I480 Paroxysmal atrial fibrillation: Secondary | ICD-10-CM | POA: Diagnosis not present

## 2020-07-21 DIAGNOSIS — R002 Palpitations: Secondary | ICD-10-CM | POA: Diagnosis not present

## 2020-07-21 DIAGNOSIS — J9811 Atelectasis: Secondary | ICD-10-CM | POA: Diagnosis not present

## 2020-07-21 DIAGNOSIS — I10 Essential (primary) hypertension: Secondary | ICD-10-CM | POA: Insufficient documentation

## 2020-07-21 DIAGNOSIS — I517 Cardiomegaly: Secondary | ICD-10-CM | POA: Diagnosis not present

## 2020-07-21 LAB — COMPREHENSIVE METABOLIC PANEL
ALT: 23 U/L (ref 0–44)
AST: 18 U/L (ref 15–41)
Albumin: 4.2 g/dL (ref 3.5–5.0)
Alkaline Phosphatase: 62 U/L (ref 38–126)
Anion gap: 9 (ref 5–15)
BUN: 12 mg/dL (ref 6–20)
CO2: 26 mmol/L (ref 22–32)
Calcium: 9.1 mg/dL (ref 8.9–10.3)
Chloride: 106 mmol/L (ref 98–111)
Creatinine, Ser: 0.93 mg/dL (ref 0.61–1.24)
GFR, Estimated: >60 mL/min (ref >60)
Glucose, Bld: 97 mg/dL (ref 70–99)
Potassium: 4.1 mmol/L (ref 3.5–5.1)
Sodium: 141 mmol/L (ref 135–145)
Total Bilirubin: 0.4 mg/dL (ref 0.3–1.2)
Total Protein: 7.4 g/dL (ref 6.5–8.1)

## 2020-07-21 LAB — CBC WITH DIFFERENTIAL/PLATELET
Abs Immature Granulocytes: 0.03 10*3/uL (ref 0.00–0.07)
Basophils Absolute: 0.1 10*3/uL (ref 0.0–0.1)
Basophils Relative: 1 %
Eosinophils Absolute: 0.3 10*3/uL (ref 0.0–0.5)
Eosinophils Relative: 5 %
HCT: 41.8 % (ref 39.0–52.0)
Hemoglobin: 14.2 g/dL (ref 13.0–17.0)
Immature Granulocytes: 1 %
Lymphocytes Relative: 31 %
Lymphs Abs: 1.9 10*3/uL (ref 0.7–4.0)
MCH: 30.9 pg (ref 26.0–34.0)
MCHC: 34 g/dL (ref 30.0–36.0)
MCV: 91.1 fL (ref 80.0–100.0)
Monocytes Absolute: 0.4 10*3/uL (ref 0.1–1.0)
Monocytes Relative: 7 %
Neutro Abs: 3.4 10*3/uL (ref 1.7–7.7)
Neutrophils Relative %: 55 %
Platelets: 229 10*3/uL (ref 150–400)
RBC: 4.59 MIL/uL (ref 4.22–5.81)
RDW: 13.4 % (ref 11.5–15.5)
WBC: 6 10*3/uL (ref 4.0–10.5)
nRBC: 0 % (ref 0.0–0.2)

## 2020-07-21 LAB — TROPONIN I (HIGH SENSITIVITY): Troponin I (High Sensitivity): 4 ng/L (ref <18)

## 2020-07-21 LAB — D-DIMER, QUANTITATIVE: D-Dimer, Quant: 0.38 ug/mL-FEU (ref 0.00–0.50)

## 2020-07-21 MED ORDER — APIXABAN 5 MG PO TABS: 5.0000 mg | ORAL_TABLET | Freq: Two times a day (BID) | ORAL | 0 refills | Status: DC

## 2020-07-21 MED ORDER — DILTIAZEM HCL 25 MG/5ML IV SOLN
20.0000 mg | Freq: Once | INTRAVENOUS | Status: AC
  Administered 2020-07-21: 20 mg via INTRAVENOUS
  Filled 2020-07-21: qty 5

## 2020-07-21 MED ORDER — DILTIAZEM HCL-DEXTROSE 125-5 MG/125ML-% IV SOLN (PREMIX)
5.0000 mg/h | INTRAVENOUS | Status: DC
  Administered 2020-07-21: 5 mg/h via INTRAVENOUS
  Filled 2020-07-21: qty 125

## 2020-07-21 MED ORDER — ETOMIDATE 2 MG/ML IV SOLN: 10.0000 mg | Freq: Once | INTRAVENOUS | Status: DC

## 2020-07-21 NOTE — ED Notes (Signed)
EDP at bedside  

## 2020-07-21 NOTE — ED Provider Notes (Signed)
[facility] EMERGENCY DEPARTMENT Provider Note   CSN: [id number] Arrival date & time: 07/21/20  9983     History Chief Complaint  Patient presents with  . Irregular Heart Beat    Devin [name] is a 50 y.o. male.  HPI     50yo male with history of atrial fibrillation (one prior episode June of last year) not on anticoagulation, hypertension presents with concern or palpitations.   Woke up at 530AM with heart racing, put on apple watch and found heart rate high Some shortness of breath, mild, chest cramping initially resolved on arrival.   No nausea/vomiting sweating Mild bilaterally ankle swelling  Drove yesterday back from beach and to get car which was about 7hr, did get out at points of drive  No fevers, no chills, no cough   Past Medical History:  Diagnosis Date  . Atrial fibrillation (HCC)   . Constipation   . GERD (gastroesophageal reflux disease)   . History of MRSA infection   . Hypertension    controlled  . Keratoconus of both eyes   . Tremor     Patient Active Problem List   Diagnosis Date Noted  . Insomnia 05/22/2019  . Atypical chest pain 12/27/2014  . Obesity (BMI 30.0-34.9) 09/18/2014  . Neck pain 02/24/2014  . Resting tremor 10/29/2013  . Weight gain 12/21/2011  . Depression with anxiety 04/29/2010  . Keratoconus 04/29/2010  . CARDIOMEGALY 03/25/2010  . HTN (hypertension) 12/31/2006  . HEART MURMUR, HX OF 12/31/2006    Past Surgical History:  Procedure Laterality Date  . NECK SURGERY  02/03/14       Family History  Problem Relation Age of Onset  . Diabetes Mother   . Hyperlipidemia Mother   . Heart disease Mother        CHF  . Thyroid disease Mother        Graves Disease  . Cancer Mother   . Alcoholism Mother   . Hypertension Father   . Alcoholism Father   . Drug abuse Father   . Obesity Father   . Asthma Daughter   . ADD / ADHD Daughter     Social History   Tobacco Use  . Smoking status: Never Smoker  .  Smokeless tobacco: Never Used  Vaping Use  . Vaping Use: Never used  Substance Use Topics  . Alcohol use: Yes    Comment: weekly  . Drug use: Never    Home Medications Prior to Admission medications   Medication Sig Start Date End Date Taking? Authorizing Provider  apixaban (ELIQUIS) 5 MG TABS tablet Take 1 tablet (5 mg total) by mouth 2 (two) times daily. 07/21/20 08/20/20 Yes Alvira Monday, MD  Blood Pressure Monitoring (BLOOD PRESSURE MONITOR/L CUFF) MISC Use to monitor blood pressure 06/24/20   Helane Rima, DO  propranolol ER (INDERAL LA) 80 MG 24 hr capsule TAKE 1 CAPSULE BY MOUTH EVERY DAY 06/07/20   Sandford Craze, NP  Vitamin D, Ergocalciferol, (DRISDOL) 1.25 MG (50000 UNIT) CAPS capsule Take 1 capsule (50,000 Units total) by mouth every 7 (seven) days. 07/13/20   Alois Cliche, PA-C    Allergies    Other, Penicillins, and Shellfish allergy  Review of Systems   Review of Systems  Constitutional: Negative for fever.  HENT: Negative for sore throat.   Eyes: Negative for visual disturbance.  Respiratory: Negative for cough and shortness of breath.   Cardiovascular: Positive for chest pain (some cramping) and palpitations.  Gastrointestinal: Negative for abdominal  pain, nausea and vomiting.  Genitourinary: Positive for frequency. Negative for difficulty urinating.  Musculoskeletal: Negative for back pain and neck stiffness.  Skin: Negative for rash.  Neurological: Negative for syncope and headaches.    Physical Exam Updated Vital Signs BP 128/77   Pulse (!) 45   Temp 97.7 F (36.5 C) (Oral)   Resp 13   Ht 5\' 9"  (1.753 m)   Wt 104.3 kg   SpO2 100%   BMI 33.97 kg/m   Physical Exam Vitals and nursing note reviewed.  Constitutional:      General: He is not in acute distress.    Appearance: He is well-developed. He is not diaphoretic.  HENT:     Head: Normocephalic and atraumatic.  Eyes:     Conjunctiva/sclera: Conjunctivae normal.  Cardiovascular:      Rate and Rhythm: Tachycardia present. Rhythm irregular.     Heart sounds: Normal heart sounds. No murmur heard. No friction rub. No gallop.   Pulmonary:     Effort: Pulmonary effort is normal. No respiratory distress.     Breath sounds: Normal breath sounds. No wheezing or rales.  Abdominal:     General: There is no distension.     Palpations: Abdomen is soft.     Tenderness: There is no abdominal tenderness. There is no guarding.  Musculoskeletal:     Cervical back: Normal range of motion.  Skin:    General: Skin is warm and dry.  Neurological:     Mental Status: He is alert and oriented to person, place, and time.     ED Results / Procedures / Treatments   Labs (all labs ordered are listed, but only abnormal results are displayed) Labs Reviewed  CBC WITH DIFFERENTIAL/PLATELET  COMPREHENSIVE METABOLIC PANEL  D-DIMER, QUANTITATIVE  TROPONIN I (HIGH SENSITIVITY)  TROPONIN I (HIGH SENSITIVITY)    EKG EKG Interpretation  Date/Time:  Wednesday July 21 2020 06:49:14 EDT Ventricular Rate:  127 PR Interval:    QRS Duration: 92 QT Interval:  315 QTC Calculation: 458 R Axis:   -21 Text Interpretation: Atrial fibrillation Borderline left axis deviation Minimal ST depression, lateral leads Previously Sinus Bradycardia Confirmed by [id number] (Paula Libra) on 07/21/2020 6:53:35 AM   Radiology DG Chest Portable 1 View  Result Date: 07/21/2020 CLINICAL DATA:  History of AFib. EXAM: PORTABLE CHEST 1 VIEW COMPARISON:  Chest x-ray 07/30/2019. FINDINGS: Mediastinum hilar structures normal. Cardiomegaly. No pulmonary venous congestion. Low lung volumes with mild right base subsegmental atelectasis. No pleural effusion or pneumothorax. Prior cervical spine fusion. IMPRESSION: 1.  Mild cardiomegaly.  No pulmonary venous congestion. 2.  Low lung volumes with mild right base atelectasis. Electronically Signed   By: 09/29/2019  Register   On: 07/21/2020 07:32    Procedures Procedures   Medications  Ordered in ED Medications  diltiazem (CARDIZEM) injection 20 mg (20 mg Intravenous Given 07/21/20 0701)    ED Course  I have reviewed the triage vital signs and the nursing notes.  Pertinent labs & imaging results that were available during my care of the patient were reviewed by me and considered in my medical decision making (see chart for details).    MDM Rules/Calculators/A&P                          50yo male with history of atrial fibrillation (one prior episode June of last year) not on anticoagulation, hypertension presents with concern or palpitations.  Presented with atrial fibrillation with RVR.  Seen during MSE by Dr. Read Drivers and diltiazem gtt initiated with improvement in rate, however persistent afib. Troponin normal, no signs of ACS. Ddimer negative, doubt PE. No significant electrolyte abnormalities or anemia.      Given persistent atrial fibrillation, and symptoms beginning this morning, discussed options for treatment including cardioversion.  Consented and were preparing for cardioversion when he spontaneously converted back into a normal rhythm. Remained in sinus rhythm without symptoms.  CHADsVasc score 1 .  I saw him last year and discussed could consider anticoagulation at this level, initiated [facility] which was discontinued at afib clinic. Again, with episode today, discussed it can be considered or holding off until Cardiology and agree to re-initiate anticoagulation with eliquis until he follows up with atrial fibrillation clinic.  Given baseline low heart rate will not initiate other rate control at this time.  Patient discharged in stable condition with understanding of reasons to return.    Final Clinical Impression(s) / ED Diagnoses Final diagnoses:  Paroxysmal atrial fibrillation (HCC)    Rx / DC Orders ED Discharge Orders         Ordered    apixaban (ELIQUIS) 5 MG TABS tablet  2 times daily        07/21/20 5003           Alvira Monday, MD 07/22/20  [phone]

## 2020-07-21 NOTE — ED Triage Notes (Signed)
Pt reports "my heart is a-fibbing again"; unable to tell when he was last seen for a- fib; reports HR is usually "low"; reports chest pain that comes and goes "like a cramp"; denies dizziness

## 2020-07-26 ENCOUNTER — Telehealth (HOSPITAL_COMMUNITY): Payer: Self-pay | Admitting: *Deleted

## 2020-07-26 ENCOUNTER — Encounter (HOSPITAL_COMMUNITY): Payer: Self-pay

## 2020-07-26 DIAGNOSIS — G4489 Other headache syndrome: Secondary | ICD-10-CM | POA: Diagnosis not present

## 2020-07-26 DIAGNOSIS — R519 Headache, unspecified: Secondary | ICD-10-CM | POA: Diagnosis not present

## 2020-07-26 DIAGNOSIS — Z20822 Contact with and (suspected) exposure to covid-19: Secondary | ICD-10-CM | POA: Diagnosis not present

## 2020-07-26 NOTE — Telephone Encounter (Signed)
I Have an appointment tomorrow in your office but I have a question I have had for the last few days my heart rate has been higher than normal for me every since the afib episode last week, yesterday a few times And once this morning I am having cold sweats my resting rate is 73 and walking is 92 which is way higher then usual for me I sent a message to my PCP but wanted to ask you as well. I have also had a dry cough and some sinus irritation but not sure if one has to do with the other.   Received above message from patient - instructed wife he should be assessed by PCP today to rule out covid/URI as any underlying infection can trigger afib. Encouraged to check temperature since having chills.  Reassured since heart rates controlled and on eliquis to continue current medications does not need to proceed to ER regarding afib unless uncontrolled HRs. Wife states they will call PCP and/or urgent care for assessment today.

## 2020-07-27 ENCOUNTER — Ambulatory Visit (HOSPITAL_COMMUNITY)
Admit: 2020-07-27 | Discharge: 2020-07-27 | Disposition: A | Discharge status: Home / Self Care | Payer: BC Managed Care – PPO | Attending: Nurse Practitioner | Admitting: Nurse Practitioner

## 2020-07-27 ENCOUNTER — Ambulatory Visit (INDEPENDENT_AMBULATORY_CARE_PROVIDER_SITE_OTHER): Payer: BC Managed Care – PPO | Admitting: Family Medicine

## 2020-07-27 ENCOUNTER — Other Ambulatory Visit: Payer: Self-pay

## 2020-07-27 ENCOUNTER — Encounter (HOSPITAL_COMMUNITY): Payer: Self-pay | Admitting: Nurse Practitioner

## 2020-07-27 ENCOUNTER — Encounter (INDEPENDENT_AMBULATORY_CARE_PROVIDER_SITE_OTHER): Payer: Self-pay | Admitting: Family Medicine

## 2020-07-27 VITALS — BP 124/78 | HR 65 | Ht 68.0 in | Wt 236.2 lb

## 2020-07-27 VITALS — BP 128/76 | HR 52 | Temp 97.5°F | Ht 68.0 in | Wt 230.0 lb

## 2020-07-27 DIAGNOSIS — Z7901 Long term (current) use of anticoagulants: Secondary | ICD-10-CM | POA: Diagnosis not present

## 2020-07-27 DIAGNOSIS — E559 Vitamin D deficiency, unspecified: Secondary | ICD-10-CM

## 2020-07-27 DIAGNOSIS — Z6836 Body mass index (BMI) 36.0-36.9, adult: Secondary | ICD-10-CM

## 2020-07-27 DIAGNOSIS — R001 Bradycardia, unspecified: Secondary | ICD-10-CM | POA: Insufficient documentation

## 2020-07-27 DIAGNOSIS — E669 Obesity, unspecified: Secondary | ICD-10-CM | POA: Diagnosis not present

## 2020-07-27 DIAGNOSIS — I480 Paroxysmal atrial fibrillation: Secondary | ICD-10-CM

## 2020-07-27 DIAGNOSIS — I10 Essential (primary) hypertension: Secondary | ICD-10-CM | POA: Insufficient documentation

## 2020-07-27 DIAGNOSIS — E65 Localized adiposity: Secondary | ICD-10-CM | POA: Diagnosis not present

## 2020-07-27 DIAGNOSIS — Z79899 Other long term (current) drug therapy: Secondary | ICD-10-CM | POA: Insufficient documentation

## 2020-07-27 DIAGNOSIS — Z6835 Body mass index (BMI) 35.0-35.9, adult: Secondary | ICD-10-CM | POA: Diagnosis not present

## 2020-07-27 DIAGNOSIS — Z28310 Unvaccinated for covid-19: Secondary | ICD-10-CM | POA: Diagnosis not present

## 2020-07-27 DIAGNOSIS — F1010 Alcohol abuse, uncomplicated: Secondary | ICD-10-CM | POA: Insufficient documentation

## 2020-07-27 DIAGNOSIS — R948 Abnormal results of function studies of other organs and systems: Secondary | ICD-10-CM

## 2020-07-27 DIAGNOSIS — Z9189 Other specified personal risk factors, not elsewhere classified: Secondary | ICD-10-CM | POA: Diagnosis not present

## 2020-07-27 MED ORDER — PROPRANOLOL HCL ER 60 MG PO CP24: 60.0000 mg | ORAL_CAPSULE | Freq: Every day | ORAL | 1 refills | Status: DC

## 2020-07-27 MED ORDER — VITAMIN D (ERGOCALCIFEROL) 1.25 MG (50000 UNIT) PO CAPS: 50000.0000 [IU] | ORAL_CAPSULE | ORAL | 0 refills | Status: DC

## 2020-07-27 NOTE — Progress Notes (Signed)
Chief Complaint:   OBESITY Devin [name] is here to discuss his progress with his obesity treatment plan along with follow-up of his obesity related diagnoses. See Medical Weight Management Flowsheet for bioelectrical impedance results.  Today's visit was #: 4 Starting weight: 239 lbs Starting date: 06/10/2020 Today's weight: 230 lbs Today's date: 07/27/2020 Weight change since last visit: 1 lb Total lbs lost to date: 9 lbs Body mass index is 34.97 kg/m.  Total weight loss percentage to date: -3.77%  Interim History: Unfortunately, Devin [name] had an episode of afib with RVR last week, spontaneously converted at ER, sent home on Eliquis. At our earlier May visit, we had reviewed the benefits v risks of Propranolol 80 mg daily. His HR has been in the 40s consistently. He made the choice to DC Propranolol. He did well until he went on vacation. Other risk factors: He drank 4 beer (throughout the day) but had been without alcohol x 30 days prior, he endorsed episodes of diarrhea after eating salads, he had a calorie-deficit because of intentional weight loss, he works outside in the summer heat and drinks lots of water. After his ED visit, he endorses feeling fatigue and malaise. He has had relative tachycardia at times. He was worried that he may have contracted a viral illness. No OTC medications other than Tylenol. ED and UCC visits reviewed. Today, HR regular, in the 50s. No CP, SOB, edema. HA generalized and mild. No cough or congestion.  Nutrition Plan: following a lower carbohydrate, vegetable and lean protein rich diet plan for 50% of the time. Activity:  Increased walking.  Assessment/Plan:   1. Vitamin D deficiency Not at goal. Current vitamin D is 36.9, tested on 06/10/2020. Optimal goal > 50 ng/dL.  She is taking vitamin D 50,000 IU weekly.  Plan: Continue to take prescription Vitamin D @50 ,000 IU every week as prescribed.  Follow-up for routine testing of Vitamin D, at least 2-3 times per  year to avoid over-replacement.  - Refill Vitamin D, Ergocalciferol, (DRISDOL) 1.25 MG (50000 UNIT) CAPS capsule; Take 1 capsule (50,000 Units total) by mouth every 7 (seven) days.  Dispense: 4 capsule; Refill: 0  2. Paroxysmal atrial fibrillation (HCC) Demetris had a recent episode of A-fib.  He has an appointment today at the Afib clinic. We will continue to monitor symptoms as they relate to his weight loss journey.  3. Abnormal metabolism, slow Alquan's metabolism is slower than normal. He will continue to focus on protein-rich, low simple carbohydrate foods. We reviewed the importance of hydration, regular exercise for stress reduction, and restorative sleep.   4. Visceral obesity Current visceral fat rating: 17. Visceral fat rating should be < 13. Visceral adipose tissue is a hormonally active component of total body fat. This body composition phenotype is associated with medical disorders such as metabolic syndrome, cardiovascular disease and several malignancies including prostate, breast, and colorectal cancers. Starting goal: Lose 7-10% of starting weight.   5. At risk for heart disease Due to Devin [name]'s current state of health and medical condition(s), he is at a higher risk for heart disease.  This puts the patient at much greater risk to subsequently develop cardiopulmonary conditions that can significantly affect patient's quality of life in a negative manner.    At least 8 minutes were spent on counseling [name] about these concerns today. Evidence-based interventions for health behavior change were utilized today including the discussion of self monitoring techniques, problem-solving barriers, and SMART goal setting techniques.  Specifically, regarding patient's  less desirable eating habits and patterns, we employed the technique of small changes when [name] has not been able to fully commit to his prudent nutritional plan.  6. Obesity, current BMI 35.1  Course: Devin [name] is currently in the  action stage of change. As such, his goal is to continue with weight loss efforts.   Nutrition goals: He has agreed to following a lower carbohydrate, vegetable and lean protein rich diet plan.   Exercise goals:  As tolerated.  Behavioral modification strategies: increasing lean protein intake, decreasing simple carbohydrates, increasing vegetables, increasing water intake, and no skipping meals.  Aloysuis has agreed to follow-up with our clinic in 3 weeks. He was informed of the importance of frequent follow-up visits to maximize his success with intensive lifestyle modifications for his multiple health conditions.   Objective:   Blood pressure 128/76, pulse (!) 52, temperature (!) 97.5 F (36.4 C), temperature source Oral, height 5\' 8"  (1.727 m), weight 230 lb (104.3 kg), SpO2 98 %. Body mass index is 34.97 kg/m.  General: Cooperative, alert, well developed, in no acute distress. HEENT: Conjunctivae and lids unremarkable. Cardiovascular: Regular rhythm.  Lungs: Normal work of breathing. Neurologic: No focal deficits.   Lab Results  Component Value Date   CREATININE 0.93 07/21/2020   BUN 12 07/21/2020   NA 141 07/21/2020   K 4.1 07/21/2020   CL 106 07/21/2020   CO2 26 07/21/2020   Lab Results  Component Value Date   ALT 23 07/21/2020   AST 18 07/21/2020   ALKPHOS 62 07/21/2020   BILITOT 0.4 07/21/2020   Lab Results  Component Value Date   HGBA1C 5.0 06/10/2020   Lab Results  Component Value Date   INSULIN 5.5 06/10/2020   Lab Results  Component Value Date   TSH 2.050 06/10/2020   Lab Results  Component Value Date   CHOL 168 06/10/2020   HDL 58 06/10/2020   LDLCALC 93 06/10/2020   TRIG 91 06/10/2020   Lab Results  Component Value Date   WBC 6.0 07/21/2020   HGB 14.2 07/21/2020   HCT 41.8 07/21/2020   MCV 91.1 07/21/2020   PLT 229 07/21/2020   Attestation Statements:   Reviewed by clinician on day of visit: allergies, medications, problem list, medical  history, surgical history, family history, social history, and previous encounter notes.  I, 09/20/2020, CMA, am acting as transcriptionist for Insurance claims handler, DO  I have reviewed the above documentation for accuracy and completeness, and I agree with the above. Helane Rima, DO

## 2020-07-27 NOTE — Patient Instructions (Signed)
Start propranolol 60mg  once a day

## 2020-07-27 NOTE — Progress Notes (Signed)
Primary Care Physician: Sharlene Dory, DO Referring Physician: [facility] ER f/u    Devin [name] is a 50 y.o. male with a h/o obesity, HTN, alcohol abuse that is in the afib clinic for new onset afib, treated in the ED 6/9. He did spontaneously convert and remains in SR today. He noted heart racing while watching TV and apple watch noted afib He was started on DOAC but with CHA2DS2VASc score of 1, he does not require daily per guidelines. 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 He also will note HR's in the 40's at imes without any an nodal agents on board. TSH in ER wnl, as well as other screening labs. CXR with no acute findings. Has not had covid vaccines.  He does report that he does not drink alcohol every day but when he does drink 2-3 times a week he may drink up tp 22 beers in one setting. High caffeine intake as well. He weighted 280 pounds several years ago, lost down to 200 lb with recent weight gain back up to 223. He snored when he was heavier but wife states no recent snoring.   F/u in afib clinic, 09/16/19. He does not report any further afib. Zio monitor did not show any afib as well. His echo of the heart was normal.     F/u in Afib clinic, 07/27/20 from ED visit 07/21/20. He returned to Afib during the night after returning form a week stay at the beach. [facility] I saw him last. He has really cut  back on alcohol and did not consume a lot on his vacation. He had a very long drive that day as he had to go 2 hours one way to pick up a car on top of the 3 hour beach trip. He also had been placed on propanolol for tremors but it was stopped by [facility] due to [facility] in the upper 30's/low 40's less than 2 weeks before this afib episode. He did convert in the ER. He was only in afib for a few hours. He developed URI viral symptoms and was evaluated  in an Urgent care yesterday. His  symptoms are improved today. He tested negative for Covid. He stopped Eliquis for a HA that is improved off drug. He works Holiday representative. He is in SR today. No other afib episodes since initial episode  last June.   Today, he denies symptoms of palpitations, chest pain, shortness of breath, orthopnea, PND, lower extremity edema, dizziness, presyncope, syncope, or neurologic sequela. The patient is tolerating medications without difficulties and is otherwise without complaint today.   Past Medical History:  Diagnosis Date  . Atrial fibrillation (HCC)   . Constipation   . GERD (gastroesophageal reflux disease)   . History of MRSA infection   . Hypertension    controlled  . Keratoconus of both eyes   . Tremor    Past Surgical History:  Procedure Laterality Date  . NECK SURGERY  02/03/14    Current Outpatient Medications  Medication Sig Dispense Refill  . apixaban (ELIQUIS) 5 MG TABS tablet Take 1 tablet (5 mg total) by mouth 2 (two) times daily. 60 tablet 0  . Blood Pressure Monitoring (BLOOD PRESSURE MONITOR/L CUFF) MISC Use to monitor blood pressure 1 each 0  . propranolol ER (INDERAL LA) 80 MG 24 hr capsule TAKE 1 CAPSULE BY MOUTH EVERY DAY 90  capsule 0  . Vitamin D, Ergocalciferol, (DRISDOL) 1.25 MG (50000 UNIT) CAPS capsule Take 1 capsule (50,000 Units total) by mouth every 7 (seven) days. 4 capsule 0   No current facility-administered medications for this encounter.    Allergies  Allergen Reactions  . Other Nausea And Vomiting    shrimp shrimp   . Penicillins Swelling    Has patient had a PCN reaction causing immediate rash, facial/tongue/throat swelling, SOB or lightheadedness with hypotension: Yes Has patient had a PCN reaction causing severe rash involving mucus membranes or skin necrosis: No Has patient had a PCN reaction that required hospitalization: No Has patient had a PCN reaction occurring within the last 10 years: No If all of the above answers are "NO", then may  proceed with Cephalosporin use. REACTION: causes rash and sob.  . Shellfish Allergy Nausea And Vomiting    shrimp    Social History   Socioeconomic History  . Marital status: Married    Spouse name: Not on file  . Number of children: 3  . Years of education: Not on file  . Highest education level: Not on file  Occupational History  . Occupation: Heritage manager: [employer]  Tobacco Use  . Smoking status: Never Smoker  . Smokeless tobacco: Never Used  Vaping Use  . Vaping Use: Never used  Substance and Sexual Activity  . Alcohol use: Yes    Comment: weekly  . Drug use: Never  . Sexual activity: Not on file  Other Topics Concern  . Not on file  Social History Narrative   Regular exercise: yes (runs and uses elliptical machine)   Quit school in the 8th grade.   Social Determinants of Health   Financial Resource Strain: Not on file  Food Insecurity: Not on file  Transportation Needs: Not on file  Physical Activity: Not on file  Stress: Not on file  Social Connections: Not on file  Intimate Partner Violence: Not on file    Family History  Problem Relation Age of Onset  . Diabetes Mother   . Hyperlipidemia Mother   . Heart disease Mother        CHF  . Thyroid disease Mother        Graves Disease  . Cancer Mother   . Alcoholism Mother   . Hypertension Father   . Alcoholism Father   . Drug abuse Father   . Obesity Father   . Asthma Daughter   . ADD / ADHD Daughter     ROS- All systems are reviewed and negative except as per the HPI above  Physical Exam: There were no vitals filed for this visit. Wt Readings from Last 3 Encounters:  07/27/20 104.3 kg  07/21/20 104.3 kg  07/13/20 104.8 kg    Labs: Lab Results  Component Value Date   NA 141 07/21/2020   K 4.1 07/21/2020   CL 106 07/21/2020   CO2 26 07/21/2020   GLUCOSE 97 07/21/2020   BUN 12 07/21/2020   CREATININE 0.93 07/21/2020   CALCIUM 9.1 07/21/2020   No results found for:  INR Lab Results  Component Value Date   CHOL 168 06/10/2020   HDL 58 06/10/2020   LDLCALC 93 06/10/2020   TRIG 91 06/10/2020     GEN- The patient is well appearing, alert and oriented x 3 today.   Head- normocephalic, atraumatic Eyes-  Sclera clear, conjunctiva pink Ears- hearing intact Oropharynx- clear Neck- supple, no JVP Lymph- no cervical lymphadenopathy  Lungs- Clear to ausculation bilaterally, normal work of breathing Heart- Regular rate and rhythm, no murmurs, rubs or gallops, PMI not laterally displaced GI- soft, NT, ND, + BS Extremities- no clubbing, cyanosis, or edema MS- no significant deformity or atrophy Skin- no rash or lesion Psych- euthymic mood, full affect Neuro- strength and sensation are intact  EKG-sinus brady at 65  bpm, pr int 142 ms, qrs int 92 ms, qtc 409 ms    Assessment and Plan: 1. Paroxysmal  afib  New onset 07/30/19  No further issues with afib  Triggers discussed  Pt  has cut back on alcohol and caffeine and [facility]  Has 30 mg Cardizem, reviewed with instructions how to use for breakthrough afib   Would not recommend daily rate control as has brady at baseline Echo was normal last year   2. CHA2DS2VASc score of 1 He stopped eliquis  as he had a H/A, not sure if from eliquis or probable viral syndrome  By guidelines he does not have to be on daily  anticoagulation   3. Bradycardia Recently stopped propanolol 80 mg daily for  tremors (also was helpful to maintain SR), with resultant  Bradycardia,  with HR's in the upper 30's /40's Will try to restart at 60 mg daily and watch carefully for symptomatic brady   He will return next week for EKG/BP back on propanolol lower dose  I do not feel he needs any change in Afib approach as this is his only 2nd episode in one year.      Elvina Sidle [facility] [address] [place], [place] [address] [phone]

## 2020-07-28 ENCOUNTER — Ambulatory Visit: Payer: BC Managed Care – PPO | Admitting: Family Medicine

## 2020-07-28 VITALS — BP 112/60 | HR 57 | Temp 98.7°F | Resp 18 | Ht 69.0 in

## 2020-07-28 DIAGNOSIS — R5383 Other fatigue: Secondary | ICD-10-CM | POA: Diagnosis not present

## 2020-07-28 DIAGNOSIS — R5381 Other malaise: Secondary | ICD-10-CM

## 2020-07-28 DIAGNOSIS — J3489 Other specified disorders of nose and nasal sinuses: Secondary | ICD-10-CM

## 2020-07-28 DIAGNOSIS — R059 Cough, unspecified: Secondary | ICD-10-CM | POA: Diagnosis not present

## 2020-07-28 LAB — POCT INFLUENZA A/B
Influenza A, POC: NEGATIVE
Influenza B, POC: NEGATIVE

## 2020-07-28 MED ORDER — BENZONATATE 200 MG PO CAPS: 200.0000 mg | ORAL_CAPSULE | Freq: Two times a day (BID) | ORAL | 0 refills | Status: DC | PRN

## 2020-07-28 MED ORDER — IPRATROPIUM BROMIDE 0.03 % NA SOLN
2.0000 | Freq: Three times a day (TID) | NASAL | 12 refills | Status: DC
Start: 2020-07-28 — End: 2020-08-17

## 2020-07-28 MED ORDER — DOXYCYCLINE HYCLATE 100 MG PO CAPS: 100.0000 mg | ORAL_CAPSULE | Freq: Two times a day (BID) | ORAL | 0 refills | Status: DC

## 2020-07-28 NOTE — Progress Notes (Signed)
[facility] [address], [address] [place], [place] [address] [phone]  Date:  07/28/2020   Name:  Devin [name]   DOB:  July 21, 1970   MRN:  [id number]  PCP:  Sharlene Dory, DO    Chief Complaint: Cough (Pt was seen at [facility] and neg Covid test, continues to cough, gags, SOB from coughing, seen in Afib clinic yesterday, told to F/up with PCP. He has a runny nose, he has tried mucinex only and this has not helped. )   History of Present Illness:  Devin [name] is a 50 y.o. very pleasant male patient who presents with the following:  Pt here today with illness Pt of Dr Carmelia Roller- I have not seen him myself in the past  History of A fib, newly dx  Also HTN and alcohol abuse  From recent cardiology note: Assessment and Plan: 1. Paroxysmal  afib  New onset 07/30/19  No further issues with afib  Triggers discussed  Pt  has cut back on alcohol and caffeine and [facility]  Has 30 mg Cardizem, reviewed with instructions how to use for breakthrough afib   Would not recommend daily rate control as has brady at baseline Echo was normal last year  2. CHA2DS2VASc score of 1 He stopped eliquis  as he had a H/A, not sure if from eliquis or probable viral syndrome  By guidelines he does not have to be on daily anticoagulation 3. Bradycardia Recently stopped propanolol 80 mg daily for  tremors (also was helpful to maintain SR), with resultant  Bradycardia,  with HR's in the upper 30's /40's Will try to restart at 60 mg daily and watch carefully for symptomatic brady   This past Monday- today is Wednesday- he awoke feeling poorly He went to urgent care and got tested, was negative for covid 19 He notes a runny nose, cough, cough is keeping him awake  No fever noted  He feels tired, body aches  No vomiting or diarrhea  No sick contacts at home  Patient Active Problem List   Diagnosis Date Noted   . Insomnia 05/22/2019  . Atypical chest pain 12/27/2014  . Obesity (BMI 30.0-34.9) 09/18/2014  . Neck pain 02/24/2014  . Resting tremor 10/29/2013  . Weight gain 12/21/2011  . Depression with anxiety 04/29/2010  . Keratoconus 04/29/2010  . CARDIOMEGALY 03/25/2010  . HTN (hypertension) 12/31/2006  . HEART MURMUR, HX OF 12/31/2006    Past Medical History:  Diagnosis Date  . Atrial fibrillation (HCC)   . Constipation   . GERD (gastroesophageal reflux disease)   . History of MRSA infection   . Hypertension    controlled  . Keratoconus of both eyes   . Tremor     Past Surgical History:  Procedure Laterality Date  . NECK SURGERY  02/03/14    Social History   Tobacco Use  . Smoking status: Never Smoker  . Smokeless tobacco: Never Used  Vaping Use  . Vaping Use: Never used  Substance Use Topics  . Alcohol use: Yes    Comment: weekly  . Drug use: Never    Family History  Problem Relation Age of Onset  . Diabetes Mother   . Hyperlipidemia Mother   . Heart disease Mother        CHF  . Thyroid disease Mother        Graves Disease  . Cancer  Mother   . Alcoholism Mother   . Hypertension Father   . Alcoholism Father   . Drug abuse Father   . Obesity Father   . Asthma Daughter   . ADD / ADHD Daughter     Allergies  Allergen Reactions  . Other Nausea And Vomiting    shrimp shrimp   . Shellfish Allergy Nausea And Vomiting    shrimp  . Penicillins Swelling and Rash    Has patient had a PCN reaction causing immediate rash, facial/tongue/throat swelling, SOB or lightheadedness with hypotension: Yes Has patient had a PCN reaction causing severe rash involving mucus membranes or skin necrosis: No Has patient had a PCN reaction that required hospitalization: No Has patient had a PCN reaction occurring within the last 10 years: No If all of the above answers are "NO", then may proceed with Cephalosporin use. REACTION: causes rash and sob.    Medication list has  been reviewed and updated.  Current Outpatient Medications on File Prior to Visit  Medication Sig Dispense Refill  . apixaban (ELIQUIS) 5 MG TABS tablet Take 1 tablet (5 mg total) by mouth 2 (two) times daily. 60 tablet 0  . Blood Pressure Monitoring (BLOOD PRESSURE MONITOR/L CUFF) MISC Use to monitor blood pressure 1 each 0  . propranolol ER (INDERAL LA) 60 MG 24 hr capsule Take 1 capsule (60 mg total) by mouth daily. 30 capsule 1  . Vitamin D, Ergocalciferol, (DRISDOL) 1.25 MG (50000 UNIT) CAPS capsule Take 1 capsule (50,000 Units total) by mouth every 7 (seven) days. 4 capsule 0   No current facility-administered medications on file prior to visit.    Review of Systems:  As per HPI- otherwise negative.   Physical Examination: Vitals:   07/28/20 1444  BP: 112/60  Pulse: (!) 57  Resp: 18  Temp: 98.7 F (37.1 C)  SpO2: 98%   Vitals:   07/28/20 1444  Height: 5\' 9"  (1.753 m)   Body mass index is 34.88 kg/m. Ideal Body Weight: Weight in (lb) to have BMI = 25: 168.9  GEN: no acute distress.  Obese, otherwise looks well HEENT: Atraumatic, Normocephalic. Bilateral TM wnl, oropharynx normal.  PEERL,EOMI.   Ears and Nose: No external deformity. CV: RRR, No M/G/R. No JVD. No thrill. No extra heart sounds. PULM: CTA B, no wheezes, crackles, rhonchi. No retractions. No resp. distress. No accessory muscle use. ABD: S, NT, ND, +BS. No rebound. No HSM. EXTR: No c/c/e PSYCH: Normally interactive. Conversant.   Results for orders placed or performed in visit on 07/28/20  POCT Influenza A/B  Result Value Ref Range   Influenza A, POC Negative Negative   Influenza B, POC Negative Negative    Assessment and Plan: Malaise and fatigue - Plan: POCT Influenza A/B, doxycycline (VIBRAMYCIN) 100 MG capsule, benzonatate (TESSALON) 200 MG capsule, Novel Coronavirus, NAA ([facility])  Cough - Plan: POCT Influenza A/B, Novel Coronavirus, NAA ([facility])  Rhinorrhea - Plan: ipratropium (ATROVENT)  0.03 % nasal spray  Patient today for sick visit.  He has been sick for about 48 hours, tested for COVID-19 the first day of illness and was negative.  I explained that testing this early may yield false negative results.  Flu today is negative, we retested for COVID-19  In the meantime, will treat for bronchitis with doxycycline, Tessalon Perles.  He also notes chronic issues with rhinorrhea even when he is not sick, he has no history of blood, or BPH.  We will start him on Atrovent nasal  spray as needed  I will be in touch with his COVID-19 test, he is asked to contact me if not feeling better within the next couple of days-sooner if worse  This visit occurred during the SARS-CoV-2 public health emergency.  Safety protocols were in place, including screening questions prior to the visit, additional usage of staff PPE, and extensive cleaning of exam room while observing appropriate contact time as indicated for disinfecting solutions.      Signed Abbe Amsterdam, MD

## 2020-07-28 NOTE — Patient Instructions (Addendum)
We are going to treat you for bronchitis- doxycycline antibiotic Also tessalon as needed for cough and atrovent nasal for runny nose Please let me know if you are not feeling better in the next few days

## 2020-07-29 ENCOUNTER — Encounter: Payer: Self-pay | Admitting: Family Medicine

## 2020-07-29 LAB — NOVEL CORONAVIRUS, NAA: SARS-CoV-2, NAA: NOT DETECTED

## 2020-07-29 LAB — SARS-COV-2, NAA 2 DAY TAT

## 2020-08-02 ENCOUNTER — Encounter (HOSPITAL_COMMUNITY): Payer: Self-pay

## 2020-08-03 ENCOUNTER — Ambulatory Visit (INDEPENDENT_AMBULATORY_CARE_PROVIDER_SITE_OTHER): Payer: BC Managed Care – PPO | Admitting: Adult Health

## 2020-08-03 ENCOUNTER — Ambulatory Visit (HOSPITAL_COMMUNITY)
Admission: RE | Admit: 2020-08-03 | Discharge: 2020-08-03 | Disposition: A | Discharge status: Home / Self Care | Payer: BC Managed Care – PPO | Source: Ambulatory Visit | Attending: Nurse Practitioner | Admitting: Nurse Practitioner

## 2020-08-03 ENCOUNTER — Other Ambulatory Visit: Payer: Self-pay

## 2020-08-03 VITALS — BP 136/76 | HR 56

## 2020-08-03 DIAGNOSIS — I480 Paroxysmal atrial fibrillation: Secondary | ICD-10-CM | POA: Diagnosis not present

## 2020-08-03 DIAGNOSIS — Z7901 Long term (current) use of anticoagulants: Secondary | ICD-10-CM | POA: Diagnosis not present

## 2020-08-03 NOTE — Progress Notes (Signed)
Pt in for EKG back on propanolol  60 mg for an episode of afib. His HR went back down into the 40's despite being on the reduced rate. He stopped drug which I agree to do . If he has additional afib episodes then will refer to EP for a front line ablation 2/2 bradycardia. EKG shows sinus brady at 56 bpm, pr int 158 ms, qrs int 90 ms, qtc 411 ms.

## 2020-08-04 ENCOUNTER — Other Ambulatory Visit: Payer: Self-pay | Admitting: Family Medicine

## 2020-08-04 DIAGNOSIS — I480 Paroxysmal atrial fibrillation: Secondary | ICD-10-CM

## 2020-08-09 ENCOUNTER — Encounter (INDEPENDENT_AMBULATORY_CARE_PROVIDER_SITE_OTHER): Payer: Self-pay | Admitting: Family Medicine

## 2020-08-17 ENCOUNTER — Ambulatory Visit: Payer: BC Managed Care – PPO | Admitting: Family Medicine

## 2020-08-17 ENCOUNTER — Encounter: Payer: Self-pay | Admitting: Family Medicine

## 2020-08-17 ENCOUNTER — Encounter: Payer: Self-pay | Admitting: Neurology

## 2020-08-17 ENCOUNTER — Other Ambulatory Visit: Payer: Self-pay

## 2020-08-17 VITALS — BP 118/78 | HR 59 | Temp 98.1°F | Ht 68.0 in | Wt 239.0 lb

## 2020-08-17 DIAGNOSIS — I480 Paroxysmal atrial fibrillation: Secondary | ICD-10-CM | POA: Diagnosis not present

## 2020-08-17 DIAGNOSIS — G250 Essential tremor: Secondary | ICD-10-CM | POA: Diagnosis not present

## 2020-08-17 MED ORDER — PRIMIDONE 50 MG PO TABS: 50.0000 mg | ORAL_TABLET | Freq: Every day | ORAL | 2 refills | Status: DC

## 2020-08-17 NOTE — Patient Instructions (Signed)
Take 1/2 tab of the primidone for 1 week and then take a full tab daily.   If you do not hear anything about your referral in the next 1-2 weeks, call our office and ask for an update.  Let us know if you need anything.

## 2020-08-17 NOTE — Progress Notes (Signed)
Chief Complaint  Patient presents with   Tremors    Subjective: Patient is a 50 y.o. male here for follow-up tremor.  He is here with his wife.  The patient used to be on propranolol long-acting 80 mg daily.  It controls his tremor.  He was told to stop it as his pulse was in the mid 30s.  It did give him some fatigue as well.  Coming off of the propranolol may have also sent him into atrial fibrillation.  He is requesting to see a cardiologist closer to [facility].  Past Medical History:  Diagnosis Date   Atrial fibrillation (HCC)    Constipation    GERD (gastroesophageal reflux disease)    History of MRSA infection    Hypertension    controlled   Keratoconus of both eyes    Tremor     Objective: BP 118/78   Pulse (!) 59   Temp 98.1 F (36.7 C) (Oral)   Ht 5\' 8"  (1.727 m)   Wt 239 lb (108.4 kg)   SpO2 99%   BMI 36.34 kg/m  General: Awake, appears stated age Heart: RRR Neuro: No cerebellar signs, gait normal. + Intention tremor bilaterally, no resting tremor noted Lungs: CTAB, no rales, wheezes or rhonchi. No accessory muscle use Psych: Age appropriate judgment and insight, normal affect and mood  Assessment and Plan: Benign essential tremor - Plan: primidone (MYSOLINE) 50 MG tablet  Paroxysmal atrial fibrillation (HCC) - Plan: Ambulatory referral to Cardiology  Chronic, uncontrolled.  Failed propranolol, start primidone.  He has an appointment with the neurology team in 1 month.  He will follow-up with them.  He can cancel appointment if he is doing better and follow-up with me. Refer to cardiology in [facility]. I will see him in 6 months for physical or as needed. The patient voiced understanding and agreement to the plan.  [facility] [place], DO 08/17/20  4:41 PM

## 2020-08-18 ENCOUNTER — Ambulatory Visit (INDEPENDENT_AMBULATORY_CARE_PROVIDER_SITE_OTHER): Payer: BC Managed Care – PPO | Admitting: Physician Assistant

## 2020-08-25 ENCOUNTER — Other Ambulatory Visit: Payer: Self-pay | Admitting: Family Medicine

## 2020-08-25 MED ORDER — PROPRANOLOL HCL 20 MG PO TABS: 20.0000 mg | ORAL_TABLET | Freq: Three times a day (TID) | ORAL | 0 refills | Status: DC

## 2020-08-30 ENCOUNTER — Encounter (INDEPENDENT_AMBULATORY_CARE_PROVIDER_SITE_OTHER): Payer: Self-pay | Admitting: Family Medicine

## 2020-09-01 ENCOUNTER — Ambulatory Visit (INDEPENDENT_AMBULATORY_CARE_PROVIDER_SITE_OTHER): Payer: BC Managed Care – PPO | Admitting: Family Medicine

## 2020-09-07 NOTE — Progress Notes (Signed)
Assessment/Plan:   1.  Essential tremor, with long family history of such  -Primidone did make the patient sleepy.  In addition, patient has history of atrial fibrillation and likely will need anticoagulation at some point in time.  Discussed with patient that if he ended up needing something like apixaban again, it would interact with the primidone and he would need to come off of it.  -Patient back on propranolol currently, 20 mg twice per day.  He is bradycardic, but asymptomatic.  Patient does not wish to change that.  States that he feels markedly better in terms of tremor on propranolol.  Was on higher dosages in the past and apparently was significantly more bradycardic.  He has a follow-up appointment with cardiology and is going to make sure that this medication is okay.  If not, perhaps the dosage can just be tapered down just a little bit further.  2.  Paroxysmal atrial fibrillation  -Patient had an episode in June, 2021 and another 1 in June, 2022.  Was discharged from the emergency room on apixaban.  Is currently not on the medication.  Discussed that if he ends up going back on this type of medication, that it will likely interact with the primidone and he will need to come off of the primidone (unless savaysa or Coumadin is utilized).  He has had bradycardia with propranolol in the past.  3.  F/u prn  Subjective:   Devin [name] was seen today in the movement disorders clinic for neurologic consultation at the request of [place] Stell, Octaviano Batty, DO.  The consultation is for the evaluation of tremor.  Note that pt was referred to [name] for same in 2016 but he n/s the appt. He was subsequently referred to [facility], Dr. Frances Furbish, in 11/2019 but cx that appt.   Record review does indicate that first mention in the chart about tremor was in September, 2015.  He was started on propranolol, 40 mg twice per day then..  Propranolol was later stopped because of bradycardia.  recrods made available to me and  reviewed.  Patient saw his primary care physician on June 28.  At that point in time, he was started on primidone for tremor.  This made him too sleepy.  He asked if he could restart propranolol at hower dose.  He is back on propranolol, 20 mg, twice per day (RX for tid but only taking tid).  While he has brady, he states that he is asymptommatic and HR isn't slow like it was (30's).  States that he is significantly better in terms of tremor control now that he is back on propranolol.  Patient has history of atrial fibrillation, starting with first episode in June, 2021.  He presented to the emergency room in June, 2022 with palpitations and was found to be in atrial fibrillation with RVR.  He does state that he had been at the beach at that time and drinking more than usual (states maybe 2 beers per day) and he wonders if that is what threw him into A. fib.  Regardless, he spontaneously converted back to normal sinus rhythm.  Notes indicate that he was discharged on apixaban from the emergency room on June 1.  He is not on that currently.  Wife states that she hopes cardiology won't put him on that again.  Works in [employer] and can't work on that med.  Pt states that he has had tremor since his teenage years.  It has gotten somewhat  worse with time.  It waxes and wanes with degrees.  He is R hand dominant  Tremor: Yes.     How long has it been going on? Since teen years  At rest or with activation?  activation  When is it noted the most?  Writing, using the hands  Fam hx of tremor?  Yes.  , GM, mother, sister   Located where?  L>R (but he is R hand dominant)  Affected by caffeine:  unknown (admits drinks a lot of coke zero)  Affected by alcohol:  No. (none since going back into a-fib; states never been heavy drinker since younger years)  Affected by stress:  No.  Affected by fatigue:  Yes.    Spills soup if on spoon:  unsure  Spills glass of liquid if full:  may or may not  Affects ADL's  (tying shoes, brushing teeth, etc):  No.  Tremor inducing meds:  No.    PREVIOUS MEDICATIONS: Inderal LA, 80 mg (bradycardia)  ALLERGIES:   Allergies  Allergen Reactions   Other Nausea And Vomiting    shrimp shrimp    Shellfish Allergy Nausea And Vomiting    shrimp   Penicillins Swelling and Rash    Has patient had a PCN reaction causing immediate rash, facial/tongue/throat swelling, SOB or lightheadedness with hypotension: Yes Has patient had a PCN reaction causing severe rash involving mucus membranes or skin necrosis: No Has patient had a PCN reaction that required hospitalization: No Has patient had a PCN reaction occurring within the last 10 years: No If all of the above answers are "NO", then may proceed with Cephalosporin use. REACTION: causes rash and sob.    CURRENT MEDICATIONS:  Current Outpatient Medications  Medication Instructions   Blood Pressure Monitoring (BLOOD PRESSURE MONITOR/L CUFF) MISC Use to monitor blood pressure   propranolol (INDERAL) 20 mg, Oral, 3 times daily   Vitamin D (Ergocalciferol) (DRISDOL) 50,000 Units, Oral, Every 7 days    Objective:   PHYSICAL EXAMINATION:    VITALS:   Vitals:   09/09/20 0825  BP: 112/78  Pulse: (!) 50  SpO2: 98%  Weight: 242 lb (109.8 kg)  Height: 5\' 9"  (1.753 m)    GEN:  The patient appears stated age and is in NAD. HEENT:  Normocephalic, atraumatic.  The mucous membranes are moist. The superficial temporal arteries are without ropiness or tenderness. CV:  RRR Lungs:  CTAB Neck/HEME:  There are no carotid bruits bilaterally.  Neurological examination:  Orientation: The patient is alert and oriented x3.  Cranial nerves: There is good facial symmetry.  He appears to have some right ptosis, but when he takes off his glasses, it is actually much better.  The visual fields are full to confrontational testing. The speech is fluent and clear. Soft palate rises symmetrically and there is no tongue deviation.  Hearing is intact to conversational tone. Sensation: Sensation is intact to light touch throughout (facial, trunk, extremities). Vibration is intact at the bilateral big toe. There is no extinction with double simultaneous stimulation.  Motor: Strength is 5/5 in the bilateral upper and lower extremities.   Shoulder shrug is equal and symmetric.  There is no pronator drift. Deep tendon reflexes: Deep tendon reflexes are 2+/4 at the bilateral biceps, triceps, brachioradialis, patella and achilles. Plantar responses are downgoing bilaterally.  Movement examination: Tone: There is normal tone in the bilateral upper extremities.  The tone in the lower extremities is normal.  Abnormal movements: There is no rest tremor.  There is postural tremor, overall mild, left greater than right.  He has mild trouble with Archimedes spirals, left greater than right.  He is able to pour water from 1 glass to another without significantly spilling any. Coordination:  There is no decremation with RAM's Gait and Station: The patient has no difficulty arising out of a deep-seated chair without the use of the hands. The patient's stride length is good.  He is able to ambulate in a tandem fashion. I have reviewed and interpreted the following labs independently   Chemistry      Component Value Date/Time   NA 141 07/21/2020 0654   NA 141 06/10/2020 1415   K 4.1 07/21/2020 0654   CL 106 07/21/2020 0654   CO2 26 07/21/2020 0654   BUN 12 07/21/2020 0654   BUN 12 06/10/2020 1415   CREATININE 0.93 07/21/2020 0654      Component Value Date/Time   CALCIUM 9.1 07/21/2020 0654   ALKPHOS 62 07/21/2020 0654   AST 18 07/21/2020 0654   ALT 23 07/21/2020 0654   BILITOT 0.4 07/21/2020 0654   BILITOT 0.4 06/10/2020 1415      Lab Results  Component Value Date   TSH 2.050 06/10/2020   Lab Results  Component Value Date   WBC 6.0 07/21/2020   HGB 14.2 07/21/2020   HCT 41.8 07/21/2020   MCV 91.1 07/21/2020   PLT 229  07/21/2020      Cc:  Sharlene Dory, DO

## 2020-09-09 ENCOUNTER — Encounter: Payer: Self-pay | Admitting: Neurology

## 2020-09-09 ENCOUNTER — Other Ambulatory Visit: Payer: Self-pay

## 2020-09-09 ENCOUNTER — Ambulatory Visit: Payer: BC Managed Care – PPO | Admitting: Neurology

## 2020-09-09 VITALS — BP 112/78 | HR 50 | Ht 69.0 in | Wt 242.0 lb

## 2020-09-09 DIAGNOSIS — K5900 Constipation, unspecified: Secondary | ICD-10-CM | POA: Insufficient documentation

## 2020-09-09 DIAGNOSIS — I480 Paroxysmal atrial fibrillation: Secondary | ICD-10-CM | POA: Diagnosis not present

## 2020-09-09 DIAGNOSIS — H18603 Keratoconus, unspecified, bilateral: Secondary | ICD-10-CM | POA: Insufficient documentation

## 2020-09-09 DIAGNOSIS — G250 Essential tremor: Secondary | ICD-10-CM | POA: Diagnosis not present

## 2020-09-09 DIAGNOSIS — K219 Gastro-esophageal reflux disease without esophagitis: Secondary | ICD-10-CM | POA: Insufficient documentation

## 2020-09-09 DIAGNOSIS — Z8614 Personal history of Methicillin resistant Staphylococcus aureus infection: Secondary | ICD-10-CM | POA: Insufficient documentation

## 2020-09-09 NOTE — Patient Instructions (Signed)
I won't make changes for now in propranolol.  Ask cardiology if okay to stay on that.    The physicians and staff at [facility] are committed to providing excellent care. You may receive a survey requesting feedback about your experience at our office. We strive to receive "very good" responses to the survey questions. If you feel that your experience would prevent you from giving the office a "very good " response, please contact our office to try to remedy the situation. We may be reached at [phone]. Thank you for taking the time out of your busy day to complete the survey.  Essential Tremor A tremor is trembling or shaking that a person cannot control. Most tremors affect the hands or arms. Tremors can also affect the head, vocal cords, legs, and other parts of the body. Essential tremor is a tremor without a known cause. Usually, it occurs while a person is trying to perform an action. Ittends to get worse gradually as a person ages. What are the causes? The cause of this condition is not known. What increases the risk? You are more likely to develop this condition if: You have a family member with essential tremor. You are age 21 or older. You take certain medicines. What are the signs or symptoms? The main sign of a tremor is a rhythmic shaking of certain parts of your body that is uncontrolled and unintentional. You may: Have difficulty eating with a spoon or fork. Have difficulty writing. Nod your head up and down or side to side. Have a quivering voice. The shaking may: Get worse over time. Come and go. Be more noticeable on one side of your body. Get worse due to stress, fatigue, caffeine, and extreme heat or cold. How is this diagnosed? This condition may be diagnosed based on: Your symptoms and medical history. A physical exam. There is no single test to diagnose an essential tremor. However, your health care provider may order tests to rule out other causes of  your condition. These may include: Blood and urine tests. Imaging studies of your brain, such as CT scan and MRI. A test that measures involuntary muscle movement (electromyogram). How is this treated? Treatment for essential tremor depends on the severity of the condition. Some tremors may go away without treatment. Mild tremors may not need treatment if they do not affect your day-to-day life. Severe tremors may need to be treated using one or more of the following options: Medicines. Lifestyle changes. Occupational or physical therapy. Follow these instructions at home: Lifestyle  Do not use any products that contain nicotine or tobacco, such as cigarettes and e-cigarettes. If you need help quitting, ask your health care provider. Limit your caffeine intake as told by your health care provider. Try to get 8 hours of sleep each night. Find ways to manage your stress that fits your lifestyle and personality. Consider trying meditation or yoga. Try to anticipate stressful situations and allow extra time to manage them. If you are struggling emotionally with the effects of your tremor, consider working with a mental health provider.  General instructions Take over-the-counter and prescription medicines only as told by your health care provider. Avoid extreme heat and extreme cold. Keep all follow-up visits as told by your health care provider. This is important. Visits may include physical therapy visits. Contact a health care provider if: You experience any changes in the location or intensity of your tremors. You start having a tremor after starting a new medicine. You  have tremor with other symptoms, such as: Numbness. Tingling. Pain. Weakness. Your tremor gets worse. Your tremor interferes with your daily life. You feel down, blue, or sad for at least 2 weeks in a row. Worrying about your tremor and what other people think about you interferes with your everyday life  functions, including relationships, work, or school. Summary Essential tremor is a tremor without a known cause. Usually, it occurs when you are trying to perform an action. You are more likely to develop this condition if you have a family member with essential tremor. The main sign of a tremor is a rhythmic shaking of certain parts of your body that is uncontrolled and unintentional. Treatment for essential tremor depends on the severity of the condition. This information is not intended to replace advice given to you by your health care provider. Make sure you discuss any questions you have with your healthcare provider. Document Revised: 10/31/2019 Document Reviewed: 10/31/2019 Elsevier Patient Education  2022 [employer].

## 2020-09-20 ENCOUNTER — Other Ambulatory Visit: Payer: Self-pay | Admitting: Family Medicine

## 2020-09-22 ENCOUNTER — Other Ambulatory Visit: Payer: Self-pay

## 2020-09-22 ENCOUNTER — Encounter: Payer: Self-pay | Admitting: Cardiology

## 2020-09-22 ENCOUNTER — Ambulatory Visit: Payer: BC Managed Care – PPO | Admitting: Cardiology

## 2020-09-22 VITALS — BP 124/76 | HR 37 | Ht 68.0 in | Wt 245.1 lb

## 2020-09-22 DIAGNOSIS — R001 Bradycardia, unspecified: Secondary | ICD-10-CM

## 2020-09-22 DIAGNOSIS — R400 Somnolence: Secondary | ICD-10-CM | POA: Diagnosis not present

## 2020-09-22 MED ORDER — PROPRANOLOL HCL 10 MG PO TABS: 10.0000 mg | ORAL_TABLET | Freq: Three times a day (TID) | ORAL | 3 refills | Status: DC

## 2020-09-22 MED ORDER — NITROGLYCERIN 0.4 MG SL SUBL: 0.4000 mg | SUBLINGUAL_TABLET | SUBLINGUAL | 3 refills | Status: AC | PRN

## 2020-09-22 NOTE — Patient Instructions (Addendum)
Medication Instructions:  Your physician has recommended you make the following change in your medication:  DECREASE: Propranolol 10 mg twice daily START: Nitroglycerin 0.4 mg take one tablet by mouth every 5 minutes up to three times as needed for chest pain.  *If you need a refill on your cardiac medications before your next appointment, please call your pharmacy*   Lab Work: Your physician recommends that you return for lab work in:  3-7 days before CT scan:  BMET, Mag If you have labs (blood work) drawn today and your tests are completely normal, you will receive your results only by: MyChart Message (if you have MyChart) OR A paper copy in the mail If you have any lab test that is abnormal or we need to change your treatment, we will call you to review the results.   Testing/Procedures:   Your cardiac CT will be scheduled at one of the below locations:   [facility] [address] [place], [place] [address] [phone]  If scheduled at [facility], please arrive at the [facility] main entrance (entrance A) of [facility] 30 minutes prior to test start time. Proceed to the [facility] Radiology Department (first floor) to check-in and test prep.  Please follow these instructions carefully (unless otherwise directed):  On the Night Before the Test: Be sure to Drink plenty of water. Do not consume any caffeinated/decaffeinated beverages or chocolate 12 hours prior to your test. Do not take any antihistamines 12 hours prior to your test.  On the Day of the Test: Drink plenty of water until 1 hour prior to the test. Do not eat any food 4 hours prior to the test. You may take your regular medications prior to the test.   FEMALES- please wear underwire-free bra if available, avoid dresses & tight clothing      After the Test: Drink plenty of water. After receiving IV contrast, you may experience a mild flushed feeling. This is normal. On  occasion, you may experience a mild rash up to 24 hours after the test. This is not dangerous. If this occurs, you can take Benadryl 25 mg and increase your fluid intake. If you experience trouble breathing, this can be serious. If it is severe call 911 IMMEDIATELY. If it is mild, please call our office. If you take any of these medications: Glipizide/Metformin, Avandament, Glucavance, please do not take 48 hours after completing test unless otherwise instructed.  Please allow 2-4 weeks for scheduling of routine cardiac CTs. Some insurance companies require a pre-authorization which may delay scheduling of this test.   For non-scheduling related questions, please contact the cardiac imaging nurse navigator should you have any questions/concerns: Rockwell Alexandria, Cardiac Imaging Nurse Navigator Larey Brick, Cardiac Imaging Nurse Navigator [facility] Direct Office Dial: [phone]   For scheduling needs, including cancellations and rescheduling, please call [place], [phone].  Your physician has recommended that you have a sleep study. This test records several body functions during sleep, including: brain activity, eye movement, oxygen and carbon dioxide blood levels, heart rate and rhythm, breathing rate and rhythm, the flow of air through your mouth and nose, snoring, body muscle movements, and chest and belly movement.   Follow-Up: At [facility], you and your health needs are our priority.  As part of our continuing mission to provide you with exceptional heart care, we have created designated Provider Care Teams.  These Care Teams include your primary Cardiologist (physician) and  Advanced Practice Providers (APPs -  Physician Assistants and Nurse Practitioners) who all work together to provide you with the care you need, when you need it.  We recommend signing up for the patient portal called "MyChart".  Sign up information is provided on this After Visit  Summary.  MyChart is used to connect with patients for Virtual Visits (Telemedicine).  Patients are able to view lab/test results, encounter notes, upcoming appointments, etc.  Non-urgent messages can be sent to your provider as well.   To learn more about what you can do with MyChart, go to [email or web address].    Your next appointment:   3 month(s)  The format for your next appointment:   In Person  Provider:   Thomasene Ripple, DO   Other Instructions  Sleep Study, Adult A sleep study (polysomnogram) is a series of tests done while you are sleeping. A sleep study records your brain waves, heart rate, breathing rate, oxygen level, and eye and legmovements. A sleep study helps your health care provider: See how well you sleep. Diagnose a sleep disorder. Determine how severe your sleep disorder is. Create a plan to treat your sleep disorder. Your health care provider may recommend a sleep study if you: Feel sleepy on most days. Snore loudly while sleeping. Have unusual behaviors while you sleep, such as walking. Have brief periods in which you stop breathing during sleep (sleepapnea). Fall asleep suddenly during the day (narcolepsy). Have trouble falling asleep or staying asleep (insomnia). Feel like you need to move your legs when trying to fall asleep (restless legs syndrome). Move your legs by flexing and extending them regularly while asleep (periodic limb movement disorder). Act out your dreams while you sleep (sleep behavior disorder). Feel like you cannot move when you first wake up (sleep paralysis). What tests are part of a sleep study? Most sleep studies record the following during sleep: Brain activity. Eye movements. Heart rate and rhythm. Breathing rate and rhythm. Blood-oxygen level. Blood pressure. Chest and belly movement as you breathe. Arm and leg movements. Snoring or other noises. Body position. Where are sleep studies done? Sleep studies are done at  sleep centers. A sleep center may be inside [place], office, or clinic. The room where you have the study may look like a hospital room or a hotel room. The health care providers doing the study may come in and out of the room during the study. Most of the time, they will be in another room monitoringyour test as you sleep. How are sleep studies done? Most sleep studies are done during a normal period of time for a full night of sleep. You will arrive at the study center in the evening and go home in themorning. Before the test Bring your pajamas and toothbrush with you to the sleep study. Do not have caffeine on the day of your sleep study. Do not drink alcohol on the day of your sleep study. Your health care provider will let you know if you should stop taking any of your regular medicines before the test. During the test     Round, sticky patches with sensors attached to recording wires (electrodes) are placed on your scalp, face, chest, and limbs. Wires from all the electrodes and sensors run from your bed to a computer. The wires can be taken off and put back on if you need to get out of bed to go to the bathroom. A sensor is placed over your nose to measure airflow. A finger clip  is put on your finger or ear to measure your blood oxygen level (pulse oximetry). A belt is placed around your belly and a belt is placed around your chest to measure breathing movements. If you have signs of the sleep disorder called sleep apnea during your test, you may get a treatment mask to wear for the second half of the night. The mask provides positive airway pressure (PAP) to help you breathe better during sleep. This may greatly improve your sleep apnea. You will then have all tests done again with the mask in place to see if your measurements and recordings change. After the test A medical doctor who specializes in sleep will evaluate the results of your sleep study and share them with you and your  primary health care provider. Based on your results, your medical history, and a physical exam, you may be diagnosed with a sleep disorder, such as: Sleep apnea. Restless legs syndrome. Sleep-related behavior disorder. Sleep-related movement disorders. Sleep-related seizure disorders. Your health care team will help determine your treatment options based on your diagnosis. This may include: Improving your sleep habits (sleep hygiene). Wearing a continuous positive airway pressure (CPAP) or bi-level positive airway pressure (BPAP) mask. Wearing an oral device at night to improve breathing and reduce snoring. Taking medicines. Follow these instructions at home: Take over-the-counter and prescription medicines only as told by your health care provider. If you are instructed to use a CPAP or BPAP mask, make sure you use it nightly as directed. Make any lifestyle changes that your health care provider recommends. If you were given a device to open your airway while you sleep, use it only as told by your health care provider. Do not use any tobacco products, such as cigarettes, chewing tobacco, and e-cigarettes. If you need help quitting, ask your health care provider. Keep all follow-up visits as told by your health care provider. This is important. Summary A sleep study (polysomnogram) is a series of tests done while you are sleeping. It shows how well you sleep. Most sleep studies are done over one full night of sleep. You will arrive at the study center in the evening and go home in the morning. If you have signs of the sleep disorder called sleep apnea during your test, you may get a treatment mask to wear for the second half of the night. A medical doctor who specializes in sleep will evaluate the results of your sleep study and share them with your primary health care provider. This information is not intended to replace advice given to you by your health care provider. Make sure you discuss  any questions you have with your healthcare provider. Document Revised: 03/14/2019 Document Reviewed: 03/06/2017 Elsevier Patient Education  2022 [employer].

## 2020-09-22 NOTE — Progress Notes (Addendum)
Cardiology Office Note:    Date:  09/22/2020   ID:  Devin [name], DOB March 27, 1970, MRN [id number]  PCP:  Sharlene Dory, DO  Cardiologist:  None  Electrophysiologist:  None   Referring MD: Sharlene Dory*   History of Present Illness:    Devin [name] is a 50 y.o. male who presents for evaluation of paroxysmal a-fib. He reports first episode of a-fib occurred in June 2021. He felt palpitations while watching TV and his apple watch said he was in a-fib. By the time he got to the ED it had resolved. He was evaluated by cardiology at that time, but zio monitor did not show any a-fib and echo was normal.  He had a second episode of a-fib in June 2022. Upon arrival to the ED he was in a-fib with RVR. Started on diltiazem gtt. They were getting ready to cardiovert him, but he converted to NSR. No other episodes of a-fib or palpitations.   He does endorse frequent midsternal chest pain. Described as sharp, non-radiating. Occurs most days and typically lasts several minutes. Sometimes occurs at rest. No associated shortness of breath or diaphoresis.  In addition he reports that he is having fatigue and some daytime somnolence.  Past Medical History:  Diagnosis Date   Atrial fibrillation (HCC)    Constipation    GERD (gastroesophageal reflux disease)    History of MRSA infection    Hypertension    controlled   Keratoconus of both eyes    Tremor     Past Surgical History:  Procedure Laterality Date   NECK SURGERY  02/03/14    Current Medications: Current Meds  Medication Sig   nitroGLYCERIN (NITROSTAT) 0.4 MG SL tablet Place 1 tablet (0.4 mg total) under the tongue every 5 (five) minutes as needed for chest pain.   propranolol (INDERAL) 10 MG tablet Take 1 tablet (10 mg total) by mouth 3 (three) times daily.   Vitamin D, Ergocalciferol, (DRISDOL) 1.25 MG (50000 UNIT) CAPS capsule Take 1 capsule (50,000 Units total) by mouth every 7 (seven) days.    [DISCONTINUED] propranolol (INDERAL) 20 MG tablet TAKE 1 TABLET BY MOUTH THREE TIMES A DAY (Patient taking differently: Take 20 mg by mouth 2 (two) times daily.)     Allergies:   Shellfish allergy and Penicillins   Social History   Socioeconomic History   Marital status: Married    Spouse name: Taft Worthing   Number of children: 3   Years of education: Not on file   Highest education level: Not on file  Occupational History   Occupation: Heritage manager: [employer]  Tobacco Use   Smoking status: Never   Smokeless tobacco: Never  Vaping Use   Vaping Use: Never used  Substance and Sexual Activity   Alcohol use: Yes    Comment: Rarely   Drug use: Never   Sexual activity: Not on file  Other Topics Concern   Not on file  Social History Narrative   Regular exercise: yes (runs and uses elliptical machine)Quit school in the 8th grade.   One story    Right Handed    Drinks caffeine   Social Determinants of Health   Financial Resource Strain: Not on file  Food Insecurity: Not on file  Transportation Needs: Not on file  Physical Activity: Not on file  Stress: Not on file  Social Connections: Not on file     Family History: The patient's family history includes ADD /  ADHD in his daughter; Alcoholism in his father and mother; Asthma in his daughter; Brain cancer in his brother; Cancer in his mother; Diabetes in his mother; Drug abuse in his father; Heart disease in his mother; Hyperlipidemia in his mother; Hypertension in his father; Obesity in his father; Thyroid disease in his mother.  ROS:   Review of Systems  Constitution: Negative for decreased appetite, fever and weight gain.  HENT: Negative for congestion, ear discharge, hoarse voice and sore throat.   Eyes: Negative for discharge, redness, vision loss in right eye and visual halos.  Cardiovascular: +chest pain, negative for dyspnea on exertion, or orthopnea. Occasional leg swelling  Respiratory: Negative for  cough, hemoptysis, shortness of breath. +snoring  Endocrine: Negative for heat intolerance and polyphagia.  Hematologic/Lymphatic: Negative for bleeding problem. Does not bruise/bleed easily.  Skin: Negative for flushing, nail changes, rash and suspicious lesions.  Musculoskeletal: Negative for arthritis, joint pain, muscle cramps, myalgias, neck pain and stiffness.  Gastrointestinal: Negative for abdominal pain, bowel incontinence, diarrhea and excessive appetite.  Genitourinary: Negative for decreased libido, genital sores and incomplete emptying.  Neurological: Negative for brief paralysis, focal weakness, headaches and loss of balance.  Psychiatric/Behavioral: Negative for altered mental status, depression and suicidal ideas.  Allergic/Immunologic: Negative for HIV exposure and persistent infections.    EKGs/Labs/Other Studies Reviewed:    The following studies were reviewed today:  EKG:  The ekg ordered today demonstrates sinus bradycardia at 37 bpm  Echo 08/12/2019: EF 60-65%, normal LV and RV function, normal diastolic parameters  Recent Labs: 06/10/2020: TSH 2.050 07/21/2020: ALT 23; BUN 12; Creatinine, Ser 0.93; Hemoglobin 14.2; Platelets 229; Potassium 4.1; Sodium 141  Recent Lipid Panel    Component Value Date/Time   CHOL 168 06/10/2020 1415   TRIG 91 06/10/2020 1415   HDL 58 06/10/2020 1415   LDLCALC 93 06/10/2020 1415    Physical Exam:    VS:  BP 124/76 (BP Location: Left Arm, Patient Position: Sitting, Cuff Size: Large)   Pulse (!) 37   Ht  (1.727 m)   Wt 245 lb 1.3 oz (111.2 kg)   SpO2 99%   BMI 37.26 kg/m     Wt Readings from Last 3 Encounters:  09/22/20 245 lb 1.3 oz (111.2 kg)  09/09/20 242 lb (109.8 kg)  08/17/20 239 lb (108.4 kg)     GEN: Well nourished, well developed in no acute distress HEENT: Normal NECK: No JVD; No carotid bruits CARDIAC: S1S2 noted, bradycardia, regular rate, no murmurs, rubs, gallops RESPIRATORY:  Clear to auscultation  without rales, wheezing or rhonchi  EXTREMITIES: 1+ pitting edema bilateral lower extremities. No cyanosis, no clubbing MUSCULOSKELETAL:  No deformity  SKIN: Warm and dry NEUROLOGIC:  Alert and oriented x 3, non-focal PSYCHIATRIC:  Normal affect, good insight  ASSESSMENT:    1. Sinus bradycardia   2. Daytime somnolence    PLAN:     Paroxysmal A-Fib: CHADS2VASC score of 1 due to possible HTN. No indication for anticoagulation at this time. Patient educated on a-fib, instructed to contact the office if additional episodes occur.  Sinus bradycardia: patient in sinus brady with HR 37 today. Takes Propanolol  BID for tremors. Will decrease to  BID and monitor HR.  Chest Pain: history concerning for possible underlying ischemic disease. Will obtain coronary CT. Rx sent for Nitroglycerin prn. Patient does not take viagra/cialis but was counseled on risks of concurrent use.  Isolated elevated blood pressure: BP elevated to 142/80 today. Repeat readings 144/76 and  140/80. Has HTN on his problem list but has never been on medication per patient. BP has been wnl at recent visits per chart review. Advised to check BP at home and contact our office with readings. Will continue to monitor. Check BMP/Mag today. Daytime somnolence, snoring: Patient endorses daytime somnolence and snoring. Will obtain sleep study to evaluate for OSA.   The patient is in agreement with the above plan. The patient left the office in stable condition.  The patient will follow up in 3 months.   Medication Adjustments/Labs and Tests Ordered: Current medicines are reviewed at length with the patient today.  Concerns regarding medicines are outlined above.  Orders Placed This Encounter  Procedures   CT CORONARY MORPH W/CTA COR W/SCORE W/CA W/CM &/OR WO/CM   Basic metabolic panel   Magnesium   EKG 12-Lead   Split night study   Meds ordered this encounter  Medications   propranolol (INDERAL) 10 MG tablet    Sig:  Take 1 tablet (10 mg total) by mouth 3 (three) times daily.    Dispense:  180 tablet    Refill:  3   nitroGLYCERIN (NITROSTAT) 0.4 MG SL tablet    Sig: Place 1 tablet (0.4 mg total) under the tongue every 5 (five) minutes as needed for chest pain.    Dispense:  90 tablet    Refill:  3    Patient Instructions  Medication Instructions:  Your physician has recommended you make the following change in your medication:  DECREASE: Propranolol 10 mg twice daily START: Nitroglycerin 0.4 mg take one tablet by mouth every 5 minutes up to three times as needed for chest pain.  *If you need a refill on your cardiac medications before your next appointment, please call your pharmacy*   Lab Work: Your physician recommends that you return for lab work in:  3-7 days before CT scan:  BMET, Mag If you have labs (blood work) drawn today and your tests are completely normal, you will receive your results only by: MyChart Message (if you have MyChart) OR A paper copy in the mail If you have any lab test that is abnormal or we need to change your treatment, we will call you to review the results.   Testing/Procedures:   Your cardiac CT will be scheduled at one of the below locations:   [facility] [address] [place], [place] [address] [phone]  If scheduled at [facility], please arrive at the [facility] main entrance (entrance A) of [facility] 30 minutes prior to test start time. Proceed to the [facility] Radiology Department (first floor) to check-in and test prep.  Please follow these instructions carefully (unless otherwise directed):  On the Night Before the Test: Be sure to Drink plenty of water. Do not consume any caffeinated/decaffeinated beverages or chocolate 12 hours prior to your test. Do not take any antihistamines 12 hours prior to your test.  On the Day of the Test: Drink plenty of water until 1 hour prior to the test. Do not eat any  food 4 hours prior to the test. You may take your regular medications prior to the test.   FEMALES- please wear underwire-free bra if available, avoid dresses & tight clothing      After the Test: Drink plenty of water. After receiving IV contrast, you may experience a mild flushed feeling. This is normal. On occasion, you may experience a mild rash up to 24 hours after the  test. This is not dangerous. If this occurs, you can take Benadryl 25 mg and increase your fluid intake. If you experience trouble breathing, this can be serious. If it is severe call 911 IMMEDIATELY. If it is mild, please call our office. If you take any of these medications: Glipizide/Metformin, Avandament, Glucavance, please do not take 48 hours after completing test unless otherwise instructed.  Please allow 2-4 weeks for scheduling of routine cardiac CTs. Some insurance companies require a pre-authorization which may delay scheduling of this test.   For non-scheduling related questions, please contact the cardiac imaging nurse navigator should you have any questions/concerns: Rockwell AlexandriaSara Wallace, Cardiac Imaging Nurse Navigator Larey BrickMerle Prescott, Cardiac Imaging Nurse Navigator [facility] Direct Office Dial: [phone]   For scheduling needs, including cancellations and rescheduling, please call [place], [phone].  Your physician has recommended that you have a sleep study. This test records several body functions during sleep, including: brain activity, eye movement, oxygen and carbon dioxide blood levels, heart rate and rhythm, breathing rate and rhythm, the flow of air through your mouth and nose, snoring, body muscle movements, and chest and belly movement.   Follow-Up: At [facility], you and your health needs are our priority.  As part of our continuing mission to provide you with exceptional heart care, we have created designated Provider Care Teams.  These Care Teams include your  primary Cardiologist (physician) and Advanced Practice Providers (APPs -  Physician Assistants and Nurse Practitioners) who all work together to provide you with the care you need, when you need it.  We recommend signing up for the patient portal called "MyChart".  Sign up information is provided on this After Visit Summary.  MyChart is used to connect with patients for Virtual Visits (Telemedicine).  Patients are able to view lab/test results, encounter notes, upcoming appointments, etc.  Non-urgent messages can be sent to your provider as well.   To learn more about what you can do with MyChart, go to [email or web address].    Your next appointment:   3 month(s)  The format for your next appointment:   In Person  Provider:   Thomasene RippleKardie Tobb, DO   Other Instructions  Sleep Study, Adult A sleep study (polysomnogram) is a series of tests done while you are sleeping. A sleep study records your brain waves, heart rate, breathing rate, oxygen level, and eye and legmovements. A sleep study helps your health care provider: See how well you sleep. Diagnose a sleep disorder. Determine how severe your sleep disorder is. Create a plan to treat your sleep disorder. Your health care provider may recommend a sleep study if you: Feel sleepy on most days. Snore loudly while sleeping. Have unusual behaviors while you sleep, such as walking. Have brief periods in which you stop breathing during sleep (sleepapnea). Fall asleep suddenly during the day (narcolepsy). Have trouble falling asleep or staying asleep (insomnia). Feel like you need to move your legs when trying to fall asleep (restless legs syndrome). Move your legs by flexing and extending them regularly while asleep (periodic limb movement disorder). Act out your dreams while you sleep (sleep behavior disorder). Feel like you cannot move when you first wake up (sleep paralysis). What tests are part of a sleep study? Most sleep studies  record the following during sleep: Brain activity. Eye movements. Heart rate and rhythm. Breathing rate and rhythm. Blood-oxygen level. Blood pressure. Chest and belly movement as you breathe. Arm and leg movements. Snoring or other noises. Body  position. Where are sleep studies done? Sleep studies are done at sleep centers. A sleep center may be inside [place], office, or clinic. The room where you have the study may look like a hospital room or a hotel room. The health care providers doing the study may come in and out of the room during the study. Most of the time, they will be in another room monitoringyour test as you sleep. How are sleep studies done? Most sleep studies are done during a normal period of time for a full night of sleep. You will arrive at the study center in the evening and go home in themorning. Before the test Bring your pajamas and toothbrush with you to the sleep study. Do not have caffeine on the day of your sleep study. Do not drink alcohol on the day of your sleep study. Your health care provider will let you know if you should stop taking any of your regular medicines before the test. During the test     Round, sticky patches with sensors attached to recording wires (electrodes) are placed on your scalp, face, chest, and limbs. Wires from all the electrodes and sensors run from your bed to a computer. The wires can be taken off and put back on if you need to get out of bed to go to the bathroom. A sensor is placed over your nose to measure airflow. A finger clip is put on your finger or ear to measure your blood oxygen level (pulse oximetry). A belt is placed around your belly and a belt is placed around your chest to measure breathing movements. If you have signs of the sleep disorder called sleep apnea during your test, you may get a treatment mask to wear for the second half of the night. The mask provides positive airway pressure (PAP) to help you  breathe better during sleep. This may greatly improve your sleep apnea. You will then have all tests done again with the mask in place to see if your measurements and recordings change. After the test A medical doctor who specializes in sleep will evaluate the results of your sleep study and share them with you and your primary health care provider. Based on your results, your medical history, and a physical exam, you may be diagnosed with a sleep disorder, such as: Sleep apnea. Restless legs syndrome. Sleep-related behavior disorder. Sleep-related movement disorders. Sleep-related seizure disorders. Your health care team will help determine your treatment options based on your diagnosis. This may include: Improving your sleep habits (sleep hygiene). Wearing a continuous positive airway pressure (CPAP) or bi-level positive airway pressure (BPAP) mask. Wearing an oral device at night to improve breathing and reduce snoring. Taking medicines. Follow these instructions at home: Take over-the-counter and prescription medicines only as told by your health care provider. If you are instructed to use a CPAP or BPAP mask, make sure you use it nightly as directed. Make any lifestyle changes that your health care provider recommends. If you were given a device to open your airway while you sleep, use it only as told by your health care provider. Do not use any tobacco products, such as cigarettes, chewing tobacco, and e-cigarettes. If you need help quitting, ask your health care provider. Keep all follow-up visits as told by your health care provider. This is important. Summary A sleep study (polysomnogram) is a series of tests done while you are sleeping. It shows how well you sleep. Most sleep studies are done over one full  night of sleep. You will arrive at the study center in the evening and go home in the morning. If you have signs of the sleep disorder called sleep apnea during your test, you  may get a treatment mask to wear for the second half of the night. A medical doctor who specializes in sleep will evaluate the results of your sleep study and share them with your primary health care provider. This information is not intended to replace advice given to you by your health care provider. Make sure you discuss any questions you have with your healthcare provider. Document Revised: 03/14/2019 Document Reviewed: 03/06/2017 Elsevier Patient Education  2022 Elsevier Inc.   Adopting a Healthy Lifestyle.  Know what a healthy weight is for you (roughly BMI <25) and aim to maintain this   Aim for 7+ servings of fruits and vegetables daily   65-80+ fluid ounces of water or unsweet tea for healthy kidneys   Limit to max 1 drink of alcohol per day; avoid smoking/tobacco   Limit animal fats in diet for cholesterol and heart health - choose grass fed whenever available   Avoid highly processed foods, and foods high in saturated/trans fats   Aim for low stress - take time to unwind and care for your mental health   Aim for 150 min of moderate intensity exercise weekly for heart health, and weights twice weekly for bone health   Aim for 7-9 hours of sleep daily   When it comes to diets, agreement about the perfect plan isnt easy to find, even among the experts. Experts at the [facility] of [employer] developed an idea known as the Healthy Eating Plate. Just imagine a plate divided into logical, healthy portions.   The emphasis is on diet quality:   Load up on vegetables and fruits - one-half of your plate: Aim for color and variety, and remember that potatoes dont count.   Go for whole grains - one-quarter of your plate: Whole wheat, barley, wheat berries, quinoa, oats, brown rice, and foods made with them. If you want pasta, go with whole wheat pasta.   Protein power - one-quarter of your plate: Fish, chicken, beans, and nuts are all healthy, versatile protein sources.  Limit red meat.   The diet, however, does go beyond the plate, offering a few other suggestions.   Use healthy plant oils, such as olive, canola, soy, corn, sunflower and peanut. Check the labels, and avoid partially hydrogenated oil, which have unhealthy trans fats.   If youre thirsty, drink water. Coffee and tea are good in moderation, but skip sugary drinks and limit milk and dairy products to one or two daily servings.   The type of carbohydrate in the diet is more important than the amount. Some sources of carbohydrates, such as vegetables, fruits, whole grains, and beans-are healthier than others.   Finally, stay active  Signed, Maury Dus, MD  09/22/2020 5:55 PM

## 2020-09-24 ENCOUNTER — Encounter (INDEPENDENT_AMBULATORY_CARE_PROVIDER_SITE_OTHER): Payer: Self-pay | Admitting: Family Medicine

## 2020-09-25 DIAGNOSIS — M25511 Pain in right shoulder: Secondary | ICD-10-CM | POA: Diagnosis not present

## 2020-09-25 DIAGNOSIS — M545 Low back pain, unspecified: Secondary | ICD-10-CM | POA: Diagnosis not present

## 2020-09-28 ENCOUNTER — Ambulatory Visit (INDEPENDENT_AMBULATORY_CARE_PROVIDER_SITE_OTHER): Payer: BC Managed Care – PPO | Admitting: Family Medicine

## 2020-09-28 ENCOUNTER — Telehealth: Payer: Self-pay | Admitting: *Deleted

## 2020-09-28 NOTE — Telephone Encounter (Signed)
-----   Message from Reynolds Bowl, RN sent at 09/22/2020  5:42 PM EDT ----- Needs precert for sleep study.  Thank you,    [facility], [place]

## 2020-09-28 NOTE — Telephone Encounter (Signed)
Prior Authorization for split night sleep study sent to [facility] via web portal. It was denied. HST approved. Auth # [id number]. Valid dates 09/27/20 to 11/26/20. Message sent to [facility]. Marland Kitchen

## 2020-09-29 ENCOUNTER — Other Ambulatory Visit: Payer: Self-pay

## 2020-09-29 ENCOUNTER — Telehealth (HOSPITAL_COMMUNITY): Payer: Self-pay | Admitting: Emergency Medicine

## 2020-09-29 ENCOUNTER — Telehealth: Payer: Self-pay

## 2020-09-29 DIAGNOSIS — G4700 Insomnia, unspecified: Secondary | ICD-10-CM

## 2020-09-29 DIAGNOSIS — R400 Somnolence: Secondary | ICD-10-CM

## 2020-09-29 NOTE — Telephone Encounter (Signed)
Reaching out to patient to offer assistance regarding upcoming cardiac imaging study; pt verbalizes understanding of appt date/time, parking situation and where to check in, pre-test NPO status and medications ordered, and verified current allergies; name and call back number provided for further questions should they arise Rockwell Alexandria RN Navigator Cardiac Imaging [facility] [phone] office [phone] cell   Denies claustro Denies IV issues Daily meds per usual

## 2020-09-29 NOTE — Telephone Encounter (Signed)
Called and left message for patient regarding his sleep study on September 2nd at 11 am.

## 2020-09-29 NOTE — Telephone Encounter (Signed)
Attempted to call patient regarding upcoming cardiac CT appointment. °Left message on voicemail with name and callback number °Avaleen Brownley RN Navigator Cardiac Imaging °[facility] °[phone] Office °[phone] Cell ° °

## 2020-09-30 ENCOUNTER — Other Ambulatory Visit: Payer: Self-pay

## 2020-09-30 ENCOUNTER — Ambulatory Visit (HOSPITAL_COMMUNITY)
Admission: RE | Admit: 2020-09-30 | Discharge: 2020-09-30 | Disposition: A | Discharge status: Home / Self Care | Payer: BC Managed Care – PPO | Source: Ambulatory Visit | Attending: Cardiology | Admitting: Cardiology

## 2020-09-30 ENCOUNTER — Encounter (HOSPITAL_COMMUNITY): Payer: Self-pay

## 2020-09-30 DIAGNOSIS — R001 Bradycardia, unspecified: Secondary | ICD-10-CM | POA: Insufficient documentation

## 2020-09-30 MED ORDER — IOHEXOL 350 MG/ML SOLN
95.0000 mL | Freq: Once | INTRAVENOUS | Status: AC | PRN
  Administered 2020-09-30: 95 mL via INTRAVENOUS

## 2020-09-30 MED ORDER — NITROGLYCERIN 0.4 MG SL SUBL
SUBLINGUAL_TABLET | SUBLINGUAL | Status: AC
  Filled 2020-09-30: qty 2

## 2020-09-30 MED ORDER — NITROGLYCERIN 0.4 MG SL SUBL
0.8000 mg | SUBLINGUAL_TABLET | Freq: Once | SUBLINGUAL | Status: AC
  Administered 2020-09-30: 0.8 mg via SUBLINGUAL

## 2020-10-12 DIAGNOSIS — M545 Low back pain, unspecified: Secondary | ICD-10-CM | POA: Diagnosis not present

## 2020-10-22 ENCOUNTER — Encounter (HOSPITAL_BASED_OUTPATIENT_CLINIC_OR_DEPARTMENT_OTHER): Payer: BC Managed Care – PPO | Admitting: Cardiovascular Disease

## 2020-11-24 ENCOUNTER — Ambulatory Visit (HOSPITAL_BASED_OUTPATIENT_CLINIC_OR_DEPARTMENT_OTHER): Payer: BC Managed Care – PPO | Attending: Cardiology | Admitting: Cardiovascular Disease

## 2020-11-24 ENCOUNTER — Other Ambulatory Visit: Payer: Self-pay

## 2020-11-24 DIAGNOSIS — G4733 Obstructive sleep apnea (adult) (pediatric): Secondary | ICD-10-CM | POA: Insufficient documentation

## 2020-11-24 DIAGNOSIS — R400 Somnolence: Secondary | ICD-10-CM | POA: Diagnosis not present

## 2020-11-24 DIAGNOSIS — G4700 Insomnia, unspecified: Secondary | ICD-10-CM

## 2020-12-16 ENCOUNTER — Encounter (HOSPITAL_BASED_OUTPATIENT_CLINIC_OR_DEPARTMENT_OTHER): Payer: Self-pay | Admitting: Cardiovascular Disease

## 2020-12-16 NOTE — Procedures (Signed)
      Patient Name: Devin [name], Devin [name] Date: 11/24/2020 Gender: Male D.O.B: May 03, 1970 Age (years): 49 Referring Provider: Lavona Mound Tobb DO Height (inches): 68 Interpreting Physician: Nicki Guadalajara MD, ABSM Weight (lbs): 245 RPSGT: Elaina Pattee BMI: 37 MRN: [id number] Neck Size:   CLINICAL INFORMATION Sleep Study Type: HST  Indication for sleep study: PAF, daytime somnolence  Epworth Sleepiness Score: 1  SLEEP STUDY TECHNIQUE A multi-channel overnight portable sleep study was performed. The channels recorded were: nasal airflow, thoracic respiratory movement, and oxygen saturation with a pulse oximetry. Snoring was also monitored.  MEDICATIONS nitroGLYCERIN (NITROSTAT) 0.4 MG SL tablet propranolol (INDERAL) 10 MG tablet Vitamin D, Ergocalciferol, (DRISDOL) 1.25 MG (50000 UNIT) CAPS capsule Patient self administered medications include: N/A.  SLEEP ARCHITECTURE Patient was studied for 390.6 minutes. The sleep efficiency was 100.0 % and the patient was supine for 0%. The arousal index was 0.0 per hour.  RESPIRATORY PARAMETERS The overall AHI was 16.6 per hour, with a central apnea index of 0 per hour. Supine sleep was absent.   The oxygen nadir was 80% during sleep.  CARDIAC DATA Mean heart rate during sleep was 48.6 bpm.  IMPRESSIONS - Moderate obstructive sleep apnea occurred during this study (AHI 16.6/h). With absent supine sleep, the severity with supine sleep and during REM sleep cannot be assessed on this home study.  - Significant oxygen desaturation to a nadir of 80%. - Patient snored 0.5% during the sleep.  DIAGNOSIS - Obstructive Sleep Apnea (G47.33)  RECOMMENDATIONS - Recommend therapeutic CPAP therapy for treatmentent of his sleep disordered breathing. If unable to schedule an in-lab titration study, initiate CPAP Auto with EPR of 3 at 7 - 18 cm of water. - Effort should be made to optimize nasal and oropharyngeal patency.  - Avoid alcohol,  sedatives and other CNS depressants that may worsen sleep apnea and disrupt normal sleep architecture. - Sleep hygiene should be reviewed to assess factors that may improve sleep quality. - Weight management (BMI 37) and regular exercise should be initiated or continued. - Recommend a download and sleep clinic evaluation after 4 weeks of therapy.    [Electronically signed] 12/16/2020 09:59 AM  Nicki Guadalajara MD, [facility], ABSM Diplomate, American Board of Sleep Medicine   NPI: [id number] [facility] PH: [phone]   FX: [phone] ACCREDITED BY THE AMERICAN ACADEMY OF SLEEP MEDICINE

## 2020-12-20 ENCOUNTER — Telehealth: Payer: Self-pay | Admitting: *Deleted

## 2020-12-20 NOTE — Telephone Encounter (Signed)
Patient notified of HST results and recommendations. He agrees to proceed with MD recommendations. PA request sent to [facility]. Patient does not qualify for CPAP titration, however they did Authorize APAP. Order sent to [facility].

## 2020-12-24 DIAGNOSIS — M25462 Effusion, left knee: Secondary | ICD-10-CM | POA: Diagnosis not present

## 2020-12-27 DIAGNOSIS — S83242A Other tear of medial meniscus, current injury, left knee, initial encounter: Secondary | ICD-10-CM | POA: Diagnosis not present

## 2020-12-28 ENCOUNTER — Ambulatory Visit: Payer: BC Managed Care – PPO | Admitting: Cardiology

## 2020-12-28 ENCOUNTER — Encounter: Payer: Self-pay | Admitting: Cardiology

## 2020-12-28 ENCOUNTER — Other Ambulatory Visit: Payer: Self-pay

## 2020-12-28 VITALS — BP 136/76 | HR 44 | Ht 70.0 in | Wt 249.0 lb

## 2020-12-28 DIAGNOSIS — I480 Paroxysmal atrial fibrillation: Secondary | ICD-10-CM | POA: Insufficient documentation

## 2020-12-28 DIAGNOSIS — G4733 Obstructive sleep apnea (adult) (pediatric): Secondary | ICD-10-CM

## 2020-12-28 DIAGNOSIS — R001 Bradycardia, unspecified: Secondary | ICD-10-CM | POA: Diagnosis not present

## 2020-12-28 DIAGNOSIS — E669 Obesity, unspecified: Secondary | ICD-10-CM | POA: Insufficient documentation

## 2020-12-28 NOTE — Progress Notes (Signed)
Cardiology Office Note:    Date:  12/28/2020   ID:  Devin [name], DOB [birth year], MRN [id number]  PCP:  Sharlene Dory, DO  Cardiologist:  Thomasene Ripple, DO  Electrophysiologist:  None   Referring MD: Sharlene Dory*   "I am doing well."  History of Present Illness:    Devin [name] is a 50 y.o. male with a hx of paroxysmal atrial fibrillation no indication yet for anticoagulation, recently diagnosed obstructive sleep apnea waiting for his CPAP, sinus pericardia is here today for follow-up visit.  First saw the patient back in August at that time he was just diagnosed with A. fib.  We discussed the diagnosis of A. fib and noted that there was no need for any anticoagulation.  I also referred the patient for sleep study.  Past Medical History:  Diagnosis Date   Atrial fibrillation (HCC)    Constipation    GERD (gastroesophageal reflux disease)    History of MRSA infection    Hypertension    controlled   Keratoconus of both eyes    Tremor     Past Surgical History:  Procedure Laterality Date   NECK SURGERY  02/03/14    Current Medications: Current Meds  Medication Sig   methylPREDNISolone (MEDROL DOSEPAK) 4 MG TBPK tablet Take 4 mg by mouth daily. For 6 days only   propranolol (INDERAL) 10 MG tablet Take 1 tablet (10 mg total) by mouth 3 (three) times daily.   Vitamin D, Ergocalciferol, (DRISDOL) 1.25 MG (50000 UNIT) CAPS capsule Take 1 capsule (50,000 Units total) by mouth every 7 (seven) days.     Allergies:   Shellfish allergy and Penicillins   Social History   Socioeconomic History   Marital status: Married    Spouse name: Jerico Cruse   Number of children: 3   Years of education: Not on file   Highest education level: Not on file  Occupational History   Occupation: Heritage manager: [employer]  Tobacco Use   Smoking status: Never   Smokeless tobacco: Never  Vaping Use   Vaping Use: Never used  Substance and Sexual  Activity   Alcohol use: Yes    Comment: Rarely   Drug use: Never   Sexual activity: Not on file  Other Topics Concern   Not on file  Social History Narrative   Regular exercise: yes (runs and uses elliptical machine)Quit school in the 8th grade.   One story    Right Handed    Drinks caffeine   Social Determinants of Health   Financial Resource Strain: Not on file  Food Insecurity: Not on file  Transportation Needs: Not on file  Physical Activity: Not on file  Stress: Not on file  Social Connections: Not on file     Family History: The patient's family history includes ADD / ADHD in his daughter; Alcoholism in his father and mother; Asthma in his daughter; Brain cancer in his brother; Cancer in his mother; Diabetes in his mother; Drug abuse in his father; Heart disease in his mother; Hyperlipidemia in his mother; Hypertension in his father; Obesity in his father; Thyroid disease in his mother.  ROS:   Review of Systems  Constitution: Negative for decreased appetite, fever and weight gain.  HENT: Negative for congestion, ear discharge, hoarse voice and sore throat.   Eyes: Negative for discharge, redness, vision loss in right eye and visual halos.  Cardiovascular: Negative for chest pain, dyspnea on exertion, leg swelling,  orthopnea and palpitations.  Respiratory: Negative for cough, hemoptysis, shortness of breath and snoring.   Endocrine: Negative for heat intolerance and polyphagia.  Hematologic/Lymphatic: Negative for bleeding problem. Does not bruise/bleed easily.  Skin: Negative for flushing, nail changes, rash and suspicious lesions.  Musculoskeletal: Negative for arthritis, joint pain, muscle cramps, myalgias, neck pain and stiffness.  Gastrointestinal: Negative for abdominal pain, bowel incontinence, diarrhea and excessive appetite.  Genitourinary: Negative for decreased libido, genital sores and incomplete emptying.  Neurological: Negative for brief paralysis, focal  weakness, headaches and loss of balance.  Psychiatric/Behavioral: Negative for altered mental status, depression and suicidal ideas.  Allergic/Immunologic: Negative for HIV exposure and persistent infections.    EKGs/Labs/Other Studies Reviewed:    The following studies were reviewed today:   EKG: None today  Recent Labs: 06/10/2020: TSH 2.050 07/21/2020: ALT 23; BUN 12; Creatinine, Ser 0.93; Hemoglobin 14.2; Platelets 229; Potassium 4.1; Sodium 141  Recent Lipid Panel    Component Value Date/Time   CHOL 168 06/10/2020 1415   TRIG 91 06/10/2020 1415   HDL 58 06/10/2020 1415   LDLCALC 93 06/10/2020 1415    Physical Exam:    VS:  BP 136/76 (BP Location: Left Arm)   Pulse (!) 44   Ht 5\' 10"  (1.778 m)   Wt 249 lb (112.9 kg)   SpO2 99%   BMI 35.73 kg/m     Wt Readings from Last 3 Encounters:  12/28/20 249 lb (112.9 kg)  11/24/20 235 lb (106.6 kg)  09/22/20 245 lb 1.3 oz (111.2 kg)     GEN: Well nourished, well developed in no acute distress HEENT: Normal NECK: No JVD; No carotid bruits LYMPHATICS: No lymphadenopathy CARDIAC: S1S2 noted,RRR, no murmurs, rubs, gallops RESPIRATORY:  Clear to auscultation without rales, wheezing or rhonchi  ABDOMEN: Soft, non-tender, non-distended, +bowel sounds, no guarding. EXTREMITIES: No edema, No cyanosis, no clubbing MUSCULOSKELETAL:  No deformity  SKIN: Warm and dry NEUROLOGIC:  Alert and oriented x 3, non-focal PSYCHIATRIC:  Normal affect, good insight  ASSESSMENT:    1. PAF (paroxysmal atrial fibrillation) ([place])   2. Sinus bradycardia   3. OSA (obstructive sleep apnea)   4. Obesity (BMI 30-39.9)    PLAN:     He has had no A. fib episodes.  He is on propanolol which initially was started for his tremors but he has been taking this.  His heart rate initially was low and he cut back his propranolol from 3 times a day to twice a day.  He is still with bradycardia work on cut back to once daily.  He will take the rest of his  doses as needed for his tremors.  He is waiting for his CPAP for treatment of recently diagnosed sleep apnea.  The patient understands the need to lose weight with diet and exercise. We have discussed specific strategies for this.  The patient is in agreement with the above plan. The patient left the office in stable condition.  The patient will follow up in   Medication Adjustments/Labs and Tests Ordered: Current medicines are reviewed at length with the patient today.  Concerns regarding medicines are outlined above.  No orders of the defined types were placed in this encounter.  No orders of the defined types were placed in this encounter.   There are no Patient Instructions on file for this visit.   Adopting a Healthy Lifestyle.  Know what a healthy weight is for you (roughly BMI <25) and aim to maintain this  Aim for 7+ servings of fruits and vegetables daily   65-80+ fluid ounces of water or unsweet tea for healthy kidneys   Limit to max 1 drink of alcohol per day; avoid smoking/tobacco   Limit animal fats in diet for cholesterol and heart health - choose grass fed whenever available   Avoid highly processed foods, and foods high in saturated/trans fats   Aim for low stress - take time to unwind and care for your mental health   Aim for 150 min of moderate intensity exercise weekly for heart health, and weights twice weekly for bone health   Aim for 7-9 hours of sleep daily   When it comes to diets, agreement about the perfect plan isnt easy to find, even among the experts. Experts at the [facility] of [employer] developed an idea known as the Healthy Eating Plate. Just imagine a plate divided into logical, healthy portions.   The emphasis is on diet quality:   Load up on vegetables and fruits - one-half of your plate: Aim for color and variety, and remember that potatoes dont count.   Go for whole grains - one-quarter of your plate: Whole wheat, barley,  wheat berries, quinoa, oats, brown rice, and foods made with them. If you want pasta, go with whole wheat pasta.   Protein power - one-quarter of your plate: Fish, chicken, beans, and nuts are all healthy, versatile protein sources. Limit red meat.   The diet, however, does go beyond the plate, offering a few other suggestions.   Use healthy plant oils, such as olive, canola, soy, corn, sunflower and peanut. Check the labels, and avoid partially hydrogenated oil, which have unhealthy trans fats.   If youre thirsty, drink water. Coffee and tea are good in moderation, but skip sugary drinks and limit milk and dairy products to one or two daily servings.   The type of carbohydrate in the diet is more important than the amount. Some sources of carbohydrates, such as vegetables, fruits, whole grains, and beans-are healthier than others.   Finally, stay active  Signed, Thomasene Ripple, DO  12/28/2020 8:22 AM    [facility]

## 2020-12-28 NOTE — Patient Instructions (Signed)

## 2021-01-10 DIAGNOSIS — M25562 Pain in left knee: Secondary | ICD-10-CM | POA: Diagnosis not present

## 2021-02-01 ENCOUNTER — Encounter: Payer: Self-pay | Admitting: Cardiology

## 2021-02-01 ENCOUNTER — Telehealth: Payer: Self-pay | Admitting: Cardiology

## 2021-02-01 ENCOUNTER — Telehealth: Payer: Self-pay | Admitting: Internal Medicine

## 2021-02-01 NOTE — Telephone Encounter (Signed)
Please have the patient come in tomorrow for a visit, if he can't come in person - do a double book virtual visit. I want to talk to him about rhythm control for his Afib.

## 2021-02-01 NOTE — Telephone Encounter (Signed)
Called patient back. He had sent in previous mychart message earlier in regards to AFIB. Patient did respond saying he felt well. That message was sent to Dr.Tobb to make her aware of the EKG strips that were sent. Patient states he feels okay now just did not know if anything should be done. I did advise if he was okay now, he should monitor and let us know if anything changes. Patient also aware we will call if any other recommendations from MD are given.  Patient mentions having a headache- and shooting pain in his head at times, and then a few days later goes into AFIB. I asked if patient is checking his BP during those episodes, patient denies doing this. I did recommend that he try to monitor his BP once daily, and if he is having symptoms (headache) to see if this is related. He mentions at his dentist the other day it was elevated, but not sure if it is that way at home. He will start checking this and call us if he has any other questions/concerns.   Will route message to MD.  Thanks!

## 2021-02-01 NOTE — Telephone Encounter (Signed)
Paged by Devin [name] that he was back in afib this AM. Got up to use the restroom early this AM, and had some palpitations when he returned back to bed. His apple watch reported he was in afib at a rate of around 120. He took a propranolol and the palpitations seemed to stop. He checked his apple watch again a few hours later, and it still reported atrial fibrillation although at a rate of around 80. He is going to give the propranolol a bit more time to work, and see if this breaks. I advised him to call clinic to see if he could come in for an EKG later today if he has recurrent symptoms/persistent atrial fibrillation.

## 2021-02-01 NOTE — Telephone Encounter (Signed)
Patient c/o Palpitations:  High priority if patient c/o lightheadedness, shortness of breath, or chest pain  How long have you had palpitations/irregular HR/ Afib? Are you having the symptoms now?  WOKE UP THIS MORNING AT 4 AM IN AFIB. PT IS NOT CURRENTLY IN AFIB, HE WANTS TO KNOW IF HE SHOULD BE SEEN TODAY? THE LAST 2 TIMES HE WAS IN AFIB, HE WENT TO THE ER  Are you currently experiencing lightheadedness, SOB or CP? NO  Do you have a history of afib (atrial fibrillation) or irregular heart rhythm? YES THIS IS THE 3RD TIME  Have you checked your BP or HR? (document readings if available): HR 56  Are you experiencing any other symptoms? NO OTHER SYMPTOMS

## 2021-02-01 NOTE — Telephone Encounter (Signed)
Spoke with patient, see chart.    

## 2021-02-01 NOTE — Telephone Encounter (Signed)
Spoke with pt. He will be seen by Dr. Servando Salina tomorrow.

## 2021-02-02 ENCOUNTER — Ambulatory Visit: Payer: BC Managed Care – PPO | Admitting: Cardiology

## 2021-02-02 ENCOUNTER — Other Ambulatory Visit: Payer: Self-pay

## 2021-02-02 ENCOUNTER — Encounter: Payer: Self-pay | Admitting: Cardiology

## 2021-02-02 ENCOUNTER — Telehealth (HOSPITAL_COMMUNITY): Payer: Self-pay | Admitting: *Deleted

## 2021-02-02 VITALS — BP 158/90 | HR 48 | Ht 70.0 in | Wt 254.0 lb

## 2021-02-02 DIAGNOSIS — M25561 Pain in right knee: Secondary | ICD-10-CM | POA: Diagnosis not present

## 2021-02-02 DIAGNOSIS — I10 Essential (primary) hypertension: Secondary | ICD-10-CM

## 2021-02-02 DIAGNOSIS — Z79899 Other long term (current) drug therapy: Secondary | ICD-10-CM | POA: Diagnosis not present

## 2021-02-02 DIAGNOSIS — I480 Paroxysmal atrial fibrillation: Secondary | ICD-10-CM | POA: Diagnosis not present

## 2021-02-02 DIAGNOSIS — R0789 Other chest pain: Secondary | ICD-10-CM

## 2021-02-02 DIAGNOSIS — G4733 Obstructive sleep apnea (adult) (pediatric): Secondary | ICD-10-CM

## 2021-02-02 MED ORDER — LOSARTAN POTASSIUM 25 MG PO TABS: 25.0000 mg | ORAL_TABLET | Freq: Every day | ORAL | 3 refills | Status: DC

## 2021-02-02 MED ORDER — FLECAINIDE ACETATE 50 MG PO TABS: 50.0000 mg | ORAL_TABLET | Freq: Two times a day (BID) | ORAL | 3 refills | Status: DC

## 2021-02-02 NOTE — Telephone Encounter (Signed)
Close encounter 

## 2021-02-02 NOTE — Patient Instructions (Signed)
Medication Instructions:  Your physician has recommended you make the following change in your medication: START: Flecainide 50 mg twice daily.  START: Losartan 25 mg daily *If you need a refill on your cardiac medications before your next appointment, please call your pharmacy*   Lab Work: Your physician recommends that you return for lab work in:  TODAY: BMET, [facility], [phone] If you have labs (blood work) drawn today and your tests are completely normal, you will receive your results only by: MyChart Message (if you have MyChart) OR A paper copy in the mail If you have any lab test that is abnormal or we need to change your treatment, we will call you to review the results.   Testing/Procedures: Your physician has requested that you have en exercise stress myoview. For further information please visit [email or web address] Please follow instruction sheet, as given.   How to prepare for your Myocardial StressTest: Do not eat or drink 3 hours prior to your test, except you may have water. Do not consume products containing caffeine (regular or decaffeinated) 12 hours prior to your test. (ex: coffee, chocolate, sodas, tea). Do bring a list of your current medications with you.  If not listed below, you may take your medications as normal. Do wear comfortable clothes (no dresses or overalls) and walking shoes, tennis shoes preferred (No heels or open toe shoes are allowed). Do NOT wear cologne, perfume, aftershave, or lotions (deodorant is allowed). If these instructions are not followed, your test will have to be rescheduled.    Follow-Up: At [facility], you and your health needs are our priority.  As part of our continuing mission to provide you with exceptional heart care, we have created designated Provider Care Teams.  These Care Teams include your primary Cardiologist (physician) and Advanced Practice Providers (APPs -  Physician Assistants and Nurse Practitioners) who all  work together to provide you with the care you need, when you need it.  We recommend signing up for the patient portal called "MyChart".  Sign up information is provided on this After Visit Summary.  MyChart is used to connect with patients for Virtual Visits (Telemedicine).  Patients are able to view lab/test results, encounter notes, upcoming appointments, etc.  Non-urgent messages can be sent to your provider as well.   To learn more about what you can do with MyChart, go to [email or web address].    Your next appointment:   3 month(s)  The format for your next appointment:   In Person  Provider:   Thomasene Ripple, DO     Other Instructions

## 2021-02-02 NOTE — Progress Notes (Signed)
Cardiology Office Note:    Date:  02/02/2021   ID:  Devin [name], DOB 06/17/1970, MRN [id number]  PCP:  [name], DO  Cardiologist:  Berniece Salines, DO  Electrophysiologist:  None   Referring MD: [name]*   Chief Complaint  Patient presents with   Follow-up   Atrial Fibrillation    Seem like it happens at around 4 in the morning.   Headache    Had a headache for a few days with sharp pains running through his head.    History of Present Illness:    Devin [name] is a 50 y.o. male with a hx of paroxysmal atrial fibrillation no indication yet for anticoagulation has been on propanolol which also help with his essential tremors, obstructive sleep apnea waiting for his CPAP due to shortage, hypertension, obesity.  I first saw the patient back in August 2022 at that time he was just diagnosed with A. fib.  We discussed the diagnosis of A. fib and noted that there was no need for any anticoagulation.  I also referred the patient for sleep study he was then diagnosed with sleep apnea but due to shortage of distribution CPAP his is still pending.      Past Medical History:  Diagnosis Date   Atrial fibrillation (HCC)    Constipation    GERD (gastroesophageal reflux disease)    History of MRSA infection    Hypertension    controlled   Keratoconus of both eyes    Tremor     Past Surgical History:  Procedure Laterality Date   NECK SURGERY  02/03/14    Current Medications: Current Meds  Medication Sig   flecainide (TAMBOCOR) 50 MG tablet Take 1 tablet (50 mg total) by mouth 2 (two) times daily.   losartan (COZAAR) 25 MG tablet Take 1 tablet (25 mg total) by mouth daily.   methylPREDNISolone (MEDROL DOSEPAK) 4 MG TBPK tablet Take 4 mg by mouth daily. For 6 days only   nitroGLYCERIN (NITROSTAT) 0.4 MG SL tablet Place 1 tablet (0.4 mg total) under the tongue every 5 (five) minutes as needed for chest pain.   propranolol (INDERAL) 10 MG tablet  Take 1 tablet (10 mg total) by mouth 3 (three) times daily.   Vitamin D, Ergocalciferol, (DRISDOL) 1.25 MG (50000 UNIT) CAPS capsule Take 1 capsule (50,000 Units total) by mouth every 7 (seven) days.     Allergies:   Shellfish allergy and Penicillins   Social History   Socioeconomic History   Marital status: Married    Spouse name: [name]   Number of children: 3   Years of education: Not on file   Highest education level: Not on file  Occupational History   Occupation: Information systems manager: [employer]  Tobacco Use   Smoking status: Never   Smokeless tobacco: Never  Vaping Use   Vaping Use: Never used  Substance and Sexual Activity   Alcohol use: Yes    Comment: Rarely   Drug use: Never   Sexual activity: Not on file  Other Topics Concern   Not on file  Social History Narrative   Regular exercise: yes (runs and uses elliptical machine)Quit school in the 8th grade.   One story    Right Handed    Drinks caffeine   Social Determinants of Health   Financial Resource Strain: Not on file  Food Insecurity: Not on file  Transportation Needs: Not on file  Physical Activity: Not  on file  Stress: Not on file  Social Connections: Not on file     Family History: The patient's family history includes ADD / ADHD in his daughter; Alcoholism in his father and mother; Asthma in his daughter; Brain cancer in his brother; Cancer in his mother; Diabetes in his mother; Drug abuse in his father; Heart disease in his mother; Hyperlipidemia in his mother; Hypertension in his father; Obesity in his father; Thyroid disease in his mother.  ROS:   Review of Systems  Constitution: Negative for decreased appetite, fever and weight gain.  HENT: Negative for congestion, ear discharge, hoarse voice and sore throat.   Eyes: Negative for discharge, redness, vision loss in right eye and visual halos.  Cardiovascular: Negative for chest pain, dyspnea on exertion, leg swelling, orthopnea and  palpitations.  Respiratory: Negative for cough, hemoptysis, shortness of breath and snoring.   Endocrine: Negative for heat intolerance and polyphagia.  Hematologic/Lymphatic: Negative for bleeding problem. Does not bruise/bleed easily.  Skin: Negative for flushing, nail changes, rash and suspicious lesions.  Musculoskeletal: Negative for arthritis, joint pain, muscle cramps, myalgias, neck pain and stiffness.  Gastrointestinal: Negative for abdominal pain, bowel incontinence, diarrhea and excessive appetite.  Genitourinary: Negative for decreased libido, genital sores and incomplete emptying.  Neurological: Negative for brief paralysis, focal weakness, headaches and loss of balance.  Psychiatric/Behavioral: Negative for altered mental status, depression and suicidal ideas.  Allergic/Immunologic: Negative for HIV exposure and persistent infections.    EKGs/Labs/Other Studies Reviewed:    The following studies were reviewed today:   EKG:  The ekg ordered today demonstrates sinus bradycardia 48 bpm  Zio monitor 08/2020 Sinus rhythm Nocturnal sinus bradycardia Rare premature ventricular contractions No afib No sustained arrhythmias  TTE 08/12/2019 IMPRESSIONS   1. Left ventricular ejection fraction, by estimation, is 60 to 65%. The  left ventricle has normal function. The left ventricle has no regional  wall motion abnormalities. Left ventricular diastolic parameters were  normal.   2. Right ventricular systolic function is normal. The right ventricular  size is normal. There is normal pulmonary artery systolic pressure. The  estimated right ventricular systolic pressure is Q000111Q mmHg.   3. The mitral valve is normal in structure. No evidence of mitral valve  regurgitation.   4. The aortic valve is tricuspid. Aortic valve regurgitation is not  visualized. No aortic stenosis is present.   5. The inferior vena cava is normal in size with <50% respiratory  variability, suggesting right  atrial pressure of 8 mmHg.   FINDINGS   Left Ventricle: Left ventricular ejection fraction, by estimation, is 60  to 65%. The left ventricle has normal function. The left ventricle has no  regional wall motion abnormalities. The left ventricular internal cavity  size was normal in size. There is   no left ventricular hypertrophy. Left ventricular diastolic parameters  were normal.   Right Ventricle: The right ventricular size is normal. No increase in  right ventricular wall thickness. Right ventricular systolic function is  normal. There is normal pulmonary artery systolic pressure. The tricuspid  regurgitant velocity is 2.14 m/s, and   with an assumed right atrial pressure of 3 mmHg, the estimated right  ventricular systolic pressure is Q000111Q mmHg.   Left Atrium: Left atrial size was normal in size.   Right Atrium: Right atrial size was normal in size.   Pericardium: There is no evidence of pericardial effusion.   Mitral Valve: The mitral valve is normal in structure. No evidence of  mitral valve regurgitation.   Tricuspid Valve: The tricuspid valve is normal in structure. Tricuspid  valve regurgitation is trivial.   Aortic Valve: The aortic valve is tricuspid. Aortic valve regurgitation is  not visualized. No aortic stenosis is present.   Pulmonic Valve: The pulmonic valve was not well visualized. Pulmonic valve  regurgitation is not visualized.   Aorta: The aortic root is normal in size and structure.   Venous: The inferior vena cava is normal in size with less than 50%  respiratory variability, suggesting right atrial pressure of 8 mmHg.   IAS/Shunts: The interatrial septum was not well visualized.       Recent Labs: 06/10/2020: TSH 2.050 07/21/2020: ALT 23; BUN 12; Creatinine, Ser 0.93; Hemoglobin 14.2; Platelets 229; Potassium 4.1; Sodium 141  Recent Lipid Panel    Component Value Date/Time   CHOL 168 06/10/2020 1415   TRIG 91 06/10/2020 1415   HDL 58 06/10/2020  1415   LDLCALC 93 06/10/2020 1415    Physical Exam:    VS:  BP (!) 158/90 (BP Location: Left Arm, Patient Position: Sitting, Cuff Size: Large)    Pulse (!) 48    Ht 5\' 10"  (1.778 m)    Wt 254 lb (115.2 kg)    BMI 36.45 kg/m     Wt Readings from Last 3 Encounters:  02/02/21 254 lb (115.2 kg)  12/28/20 249 lb (112.9 kg)  11/24/20 235 lb (106.6 kg)     GEN: Well nourished, well developed in no acute distress HEENT: Normal NECK: No JVD; No carotid bruits LYMPHATICS: No lymphadenopathy CARDIAC: S1S2 noted,RRR, no murmurs, rubs, gallops RESPIRATORY:  Clear to auscultation without rales, wheezing or rhonchi  ABDOMEN: Soft, non-tender, non-distended, +bowel sounds, no guarding. EXTREMITIES: No edema, No cyanosis, no clubbing MUSCULOSKELETAL:  No deformity  SKIN: Warm and dry NEUROLOGIC:  Alert and oriented x 3, non-focal PSYCHIATRIC:  Normal affect, good insight  ASSESSMENT:    1. PAF (paroxysmal atrial fibrillation) (HCC)   2. Other chest pain   3. Primary hypertension   4. Medication management   5. OSA (obstructive sleep apnea)    PLAN:    He recently went into atrial fibrillation rapid ventricular rate but a Diahann Guajardo electrothermal his heart rate has not slowed down and back in sinus rhythm-sinus bradycardia.  I will start the patient on flecainide 50 mg twice daily.  We will also get an exercise nuclear stress test to make sure her coronary artery disease not playing a role and that he is suitable for flecainide use.  He is hypertensive in the office today he tells me that his blood pressure has been running higher than that at times.  We will start low-dose ARB losartan 25 mg daily.  Unfortunately his sleep apnea is still not being treated he is waiting for the CPAP.  The patient understands the need to lose weight with diet and exercise. We have discussed specific strategies for this.  The patient is in agreement with the above plan. The patient left the office in  stable condition.  The patient will follow up in   Medication Adjustments/Labs and Tests Ordered: Current medicines are reviewed at length with the patient today.  Concerns regarding medicines are outlined above.  Orders Placed This Encounter  Procedures   Basic Metabolic Panel (BMET)   Magnesium   TSH+T4F+T3Free   CBC with Differential/Platelet   MYOCARDIAL PERFUSION IMAGING   EKG 12-Lead   Meds ordered this encounter  Medications   flecainide (TAMBOCOR)  50 MG tablet    Sig: Take 1 tablet (50 mg total) by mouth 2 (two) times daily.    Dispense:  180 tablet    Refill:  3   losartan (COZAAR) 25 MG tablet    Sig: Take 1 tablet (25 mg total) by mouth daily.    Dispense:  90 tablet    Refill:  3    Patient Instructions  Medication Instructions:  Your physician has recommended you make the following change in your medication: START: Flecainide 50 mg twice daily.  START: Losartan 25 mg daily *If you need a refill on your cardiac medications before your next appointment, please call your pharmacy*   Lab Work: Your physician recommends that you return for lab work in:  TODAY: BMET, [facility] CBC, [phone] If you have labs (blood work) drawn today and your tests are completely normal, you will receive your results only by: [place] (if you have MyChart) OR A paper copy in the mail If you have any lab test that is abnormal or we need to change your treatment, we will call you to review the results.   Testing/Procedures: Your physician has requested that you have en exercise stress myoview. For further information please visit [email or web address]. Please follow instruction sheet, as given.   How to prepare for your Myocardial StressTest: Do not eat or drink 3 hours prior to your test, except you may have water. Do not consume products containing caffeine (regular or decaffeinated) 12 hours prior to your test. (ex: coffee, chocolate, sodas, tea). Do bring a list of your  current medications with you.  If not listed below, you may take your medications as normal. Do wear comfortable clothes (no dresses or overalls) and walking shoes, tennis shoes preferred (No heels or open toe shoes are allowed). Do NOT wear cologne, perfume, aftershave, or lotions (deodorant is allowed). If these instructions are not followed, your test will have to be rescheduled.    Follow-Up: At [facility], you and your health needs are our priority.  As part of our continuing mission to provide you with exceptional heart care, we have created designated Provider Care Teams.  These Care Teams include your primary Cardiologist (physician) and Advanced Practice Providers (APPs -  Physician Assistants and Nurse Practitioners) who all work together to provide you with the care you need, when you need it.  We recommend signing up for the patient portal called "MyChart".  Sign up information is provided on this After Visit Summary.  MyChart is used to connect with patients for Virtual Visits (Telemedicine).  Patients are able to view lab/test results, encounter notes, upcoming appointments, etc.  Non-urgent messages can be sent to your provider as well.   To learn more about what you can do with MyChart, go to [email or web address].    Your next appointment:   3 month(s)  The format for your next appointment:   In Person  Provider:   Berniece Salines, DO     Other Instructions     Adopting a Healthy Lifestyle.  Know what a healthy weight is for you (roughly BMI <25) and aim to maintain this   Aim for 7+ servings of fruits and vegetables daily   65-80+ fluid ounces of water or unsweet tea for healthy kidneys   Limit to max 1 drink of alcohol per day; avoid smoking/tobacco   Limit animal fats in diet for cholesterol and heart health - choose grass fed whenever available   Avoid highly processed foods, and  foods high in saturated/trans fats   Aim for low stress - take time to  unwind and care for your mental health   Aim for 150 min of moderate intensity exercise weekly for heart health, and weights twice weekly for bone health   Aim for 7-9 hours of sleep daily   When it comes to diets, agreement about the perfect plan isnt easy to find, even among the experts. Experts at the [employer] developed an idea known as the Healthy Eating Plate. Just imagine a plate divided into logical, healthy portions.   The emphasis is on diet quality:   Load up on vegetables and fruits - one-half of your plate: Aim for color and variety, and remember that potatoes dont count.   Go for whole grains - one-quarter of your plate: Whole wheat, barley, wheat berries, quinoa, oats, brown rice, and foods made with them. If you want pasta, go with whole wheat pasta.   Protein power - one-quarter of your plate: Fish, chicken, beans, and nuts are all healthy, versatile protein sources. Limit red meat.   The diet, however, does go beyond the plate, offering a few other suggestions.   Use healthy plant oils, such as olive, canola, soy, corn, sunflower and peanut. Check the labels, and avoid partially hydrogenated oil, which have unhealthy trans fats.   If youre thirsty, drink water. Coffee and tea are good in moderation, but skip sugary drinks and limit milk and dairy products to one or two daily servings.   The type of carbohydrate in the diet is more important than the amount. Some sources of carbohydrates, such as vegetables, fruits, whole grains, and beans-are healthier than others.   Finally, stay active  Signed, Berniece Salines, DO  02/02/2021 12:27 PM    [facility]

## 2021-02-03 LAB — CBC WITH DIFFERENTIAL/PLATELET
Basophils Absolute: 0 10*3/uL (ref 0.0–0.2)
Basos: 1 %
EOS (ABSOLUTE): 0.4 10*3/uL (ref 0.0–0.4)
Eos: 6 %
Hematocrit: 39.4 % (ref 37.5–51.0)
Hemoglobin: 13.2 g/dL (ref 13.0–17.7)
Immature Grans (Abs): 0 10*3/uL (ref 0.0–0.1)
Immature Granulocytes: 0 %
Lymphocytes Absolute: 2.5 10*3/uL (ref 0.7–3.1)
Lymphs: 35 %
MCH: 29.1 pg (ref 26.6–33.0)
MCHC: 33.5 g/dL (ref 31.5–35.7)
MCV: 87 fL (ref 79–97)
Monocytes Absolute: 0.5 10*3/uL (ref 0.1–0.9)
Monocytes: 7 %
Neutrophils Absolute: 3.6 10*3/uL (ref 1.4–7.0)
Neutrophils: 51 %
Platelets: 270 10*3/uL (ref 150–450)
RBC: 4.54 x10E6/uL (ref 4.14–5.80)
RDW: 12.7 % (ref 11.6–15.4)
WBC: 7.1 10*3/uL (ref 3.4–10.8)

## 2021-02-03 LAB — BASIC METABOLIC PANEL
BUN/Creatinine Ratio: 19 (ref 9–20)
BUN: 18 mg/dL (ref 6–24)
CO2: 23 mmol/L (ref 20–29)
Calcium: 9.3 mg/dL (ref 8.7–10.2)
Chloride: 101 mmol/L (ref 96–106)
Creatinine, Ser: 0.93 mg/dL (ref 0.76–1.27)
Glucose: 69 mg/dL — ABNORMAL LOW (ref 70–99)
Potassium: 4.5 mmol/L (ref 3.5–5.2)
Sodium: 138 mmol/L (ref 134–144)
eGFR: 101 mL/min/{1.73_m2} (ref >59)

## 2021-02-03 LAB — TSH+T4F+T3FREE
Free T4: 1.21 ng/dL (ref 0.82–1.77)
T3, Free: 3 pg/mL (ref 2.0–4.4)
TSH: 2.37 u[IU]/mL (ref 0.450–4.500)

## 2021-02-03 LAB — MAGNESIUM: Magnesium: 2.2 mg/dL (ref 1.6–2.3)

## 2021-02-04 ENCOUNTER — Encounter (HOSPITAL_COMMUNITY): Payer: BC Managed Care – PPO

## 2021-02-04 ENCOUNTER — Other Ambulatory Visit: Payer: Self-pay

## 2021-02-04 ENCOUNTER — Ambulatory Visit (HOSPITAL_COMMUNITY)
Admission: RE | Admit: 2021-02-04 | Discharge: 2021-02-04 | Disposition: A | Discharge status: Home / Self Care | Payer: BC Managed Care – PPO | Source: Ambulatory Visit | Attending: Cardiovascular Disease | Admitting: Cardiovascular Disease

## 2021-02-04 DIAGNOSIS — R0789 Other chest pain: Secondary | ICD-10-CM

## 2021-02-04 LAB — MYOCARDIAL PERFUSION IMAGING
Estimated workload: 11.3
Exercise duration (min): 9 min
Exercise duration (sec): 35 s
LV dias vol: 134 mL (ref 62–150)
LV sys vol: 56 mL
MPHR: 172 {beats}/min
Nuc Stress EF: 58 %
Peak HR: 166 {beats}/min
Percent HR: 96 %
Rest HR: 52 {beats}/min
Rest Nuclear Isotope Dose: 10.8 mCi
SDS: 1
SRS: 0
SSS: 1
ST Depression (mm): 0 mm
Stress Nuclear Isotope Dose: 28.4 mCi
TID: 0.88

## 2021-02-04 MED ORDER — TECHNETIUM TC 99M TETROFOSMIN IV KIT
10.8000 | PACK | Freq: Once | INTRAVENOUS | Status: AC | PRN
  Administered 2021-02-04: 10.8 via INTRAVENOUS
  Filled 2021-02-04: qty 11

## 2021-02-04 MED ORDER — TECHNETIUM TC 99M TETROFOSMIN IV KIT
28.4000 | PACK | Freq: Once | INTRAVENOUS | Status: AC | PRN
  Administered 2021-02-04: 28.4 via INTRAVENOUS
  Filled 2021-02-04: qty 29

## 2021-02-16 ENCOUNTER — Encounter: Payer: Self-pay | Admitting: Family Medicine

## 2021-02-16 ENCOUNTER — Ambulatory Visit (INDEPENDENT_AMBULATORY_CARE_PROVIDER_SITE_OTHER): Payer: BC Managed Care – PPO | Admitting: Family Medicine

## 2021-02-16 VITALS — BP 112/74 | HR 54 | Temp 98.0°F | Ht 70.0 in | Wt 260.2 lb

## 2021-02-16 DIAGNOSIS — Z1211 Encounter for screening for malignant neoplasm of colon: Secondary | ICD-10-CM

## 2021-02-16 DIAGNOSIS — Z114 Encounter for screening for human immunodeficiency virus [HIV]: Secondary | ICD-10-CM | POA: Diagnosis not present

## 2021-02-16 DIAGNOSIS — Z0000 Encounter for general adult medical examination without abnormal findings: Secondary | ICD-10-CM

## 2021-02-16 DIAGNOSIS — M25562 Pain in left knee: Secondary | ICD-10-CM | POA: Diagnosis not present

## 2021-02-16 DIAGNOSIS — G8929 Other chronic pain: Secondary | ICD-10-CM

## 2021-02-16 DIAGNOSIS — Z1159 Encounter for screening for other viral diseases: Secondary | ICD-10-CM | POA: Diagnosis not present

## 2021-02-16 DIAGNOSIS — Z23 Encounter for immunization: Secondary | ICD-10-CM | POA: Diagnosis not present

## 2021-02-16 LAB — COMPREHENSIVE METABOLIC PANEL
ALT: 15 U/L (ref 0–53)
AST: 11 U/L (ref 0–37)
Albumin: 4.3 g/dL (ref 3.5–5.2)
Alkaline Phosphatase: 68 U/L (ref 39–117)
BUN: 17 mg/dL (ref 6–23)
CO2: 29 mEq/L (ref 19–32)
Calcium: 9.2 mg/dL (ref 8.4–10.5)
Chloride: 104 mEq/L (ref 96–112)
Creatinine, Ser: 0.97 mg/dL (ref 0.40–1.50)
GFR: 92.58 mL/min (ref >60.00)
Glucose, Bld: 90 mg/dL (ref 70–99)
Potassium: 5 mEq/L (ref 3.5–5.1)
Sodium: 139 mEq/L (ref 135–145)
Total Bilirubin: 0.5 mg/dL (ref 0.2–1.2)
Total Protein: 6.8 g/dL (ref 6.0–8.3)

## 2021-02-16 LAB — HEPATITIS C ANTIBODY
Hepatitis C Ab: NONREACTIVE
SIGNAL TO CUT-OFF: 0.06 (ref <1.00)

## 2021-02-16 LAB — CBC
HCT: 39.7 % (ref 39.0–52.0)
Hemoglobin: 13.7 g/dL (ref 13.0–17.0)
MCHC: 34.4 g/dL (ref 30.0–36.0)
MCV: 87.7 fl (ref 78.0–100.0)
Platelets: 260 10*3/uL (ref 150.0–400.0)
RBC: 4.53 Mil/uL (ref 4.22–5.81)
RDW: 13.7 % (ref 11.5–15.5)
WBC: 5.5 10*3/uL (ref 4.0–10.5)

## 2021-02-16 LAB — LIPID PANEL
Cholesterol: 187 mg/dL (ref 0–200)
HDL: 62.5 mg/dL (ref >39.00)
LDL Cholesterol: 112 mg/dL — ABNORMAL HIGH (ref 0–99)
NonHDL: 124.86
Total CHOL/HDL Ratio: 3
Triglycerides: 66 mg/dL (ref 0.0–149.0)
VLDL: 13.2 mg/dL (ref 0.0–40.0)

## 2021-02-16 LAB — HIV ANTIBODY (ROUTINE TESTING W REFLEX): HIV 1&2 Ab, 4th Generation: NONREACTIVE

## 2021-02-16 NOTE — Progress Notes (Signed)
Chief Complaint  Patient presents with   Annual Exam    Well Male Devin [name] is here for a complete physical.   His last physical was >1 year ago.  Current diet: in general, diet .   Current exercise: not as much lately Weight trend: increasing Fatigue out of ordinary? Yes; has untreated OSA, waiting on machine at this point Seat belt? No.   Advanced directive? No  Health maintenance Tetanus- Yes HIV- No Hep C- No  Past Medical History:  Diagnosis Date   Atrial fibrillation (HCC)    Constipation    GERD (gastroesophageal reflux disease)    History of MRSA infection    Hypertension    controlled   Keratoconus of both eyes    Tremor      Past Surgical History:  Procedure Laterality Date   NECK SURGERY  02/03/14    Medications  Current Outpatient Medications on File Prior to Visit  Medication Sig Dispense Refill   flecainide (TAMBOCOR) 50 MG tablet Take 1 tablet (50 mg total) by mouth 2 (two) times daily. 180 tablet 3   losartan (COZAAR) 25 MG tablet Take 1 tablet (25 mg total) by mouth daily. 90 tablet 3   propranolol (INDERAL) 10 MG tablet Take 1 tablet (10 mg total) by mouth 3 (three) times daily. 180 tablet 3   Vitamin D, Ergocalciferol, (DRISDOL) 1.25 MG (50000 UNIT) CAPS capsule Take 1 capsule (50,000 Units total) by mouth every 7 (seven) days. 4 capsule 0   nitroGLYCERIN (NITROSTAT) 0.4 MG SL tablet Place 1 tablet (0.4 mg total) under the tongue every 5 (five) minutes as needed for chest pain. 90 tablet 3    Allergies Allergies  Allergen Reactions   Shellfish Allergy Nausea And Vomiting    shrimp   Penicillins Swelling and Rash    Has patient had a PCN reaction causing immediate rash, facial/tongue/throat swelling, SOB or lightheadedness with hypotension: Yes Has patient had a PCN reaction causing severe rash involving mucus membranes or skin necrosis: No Has patient had a PCN reaction that required hospitalization: No Has patient had a PCN reaction  occurring within the last 10 years: No If all of the above answers are "NO", then may proceed with Cephalosporin use. REACTION: causes rash and sob.    Family History Family History  Problem Relation Age of Onset   Diabetes Mother    Hyperlipidemia Mother    Heart disease Mother        CHF   Thyroid disease Mother        Graves Disease   Cancer Mother    Alcoholism Mother    Hypertension Father    Alcoholism Father    Drug abuse Father    Obesity Father    Brain cancer Brother        GBM   Asthma Daughter    ADD / ADHD Daughter     Review of Systems: Constitutional: no fevers or chills Eye:  no recent significant change in vision Ear/Nose/Mouth/Throat:  Ears:  no hearing loss Nose/Mouth/Throat:  no complaints of nasal congestion, no sore throat Cardiovascular:  no chest pain Respiratory:  no shortness of breath Gastrointestinal:  no abdominal pain, no change in bowel habits GU:  Male: negative for dysuria, frequency, and incontinence Musculoskeletal/Extremities:  +chronic L knee pain Integumentary (Skin/Breast):  no abnormal skin lesions reported Neurologic:  no headaches Endocrine: No unexpected weight loss Hematologic/Lymphatic:  no night sweats  Exam BP 112/74    Pulse (!) 54  Temp 98 F (36.7 C) (Oral)    Ht 5\' 10"  (1.778 m)    Wt 260 lb 4 oz (118 kg)    SpO2 99%    BMI 37.34 kg/m  General:  well developed, well nourished, in no apparent distress Skin:  no significant moles, warts, or growths Head:  no masses, lesions, or tenderness Eyes:  pupils equal and round, sclera anicteric without injection Ears:  canals without lesions, TMs shiny without retraction, no obvious effusion, no erythema Nose:  nares patent, septum midline, mucosa normal Throat/Pharynx:  lips and gingiva without lesion; tongue and uvula midline; non-inflamed pharynx; no exudates or postnasal drainage Neck: neck supple without adenopathy, thyromegaly, or masses Lungs:  clear to  auscultation, breath sounds equal bilaterally, no respiratory distress Cardio:  regular rate and rhythm, no bruits, no LE edema Abdomen:  abdomen soft, nontender; bowel sounds normal; no masses or organomegaly Rectal: Deferred Musculoskeletal:  symmetrical muscle groups noted without atrophy or deformity Extremities:  no clubbing, cyanosis, or edema, no deformities, no skin discoloration Neuro:  gait normal; deep tendon reflexes normal and symmetric Psych: well oriented with normal range of affect and appropriate judgment/insight  Assessment and Plan  Well adult exam - Plan: CBC, Comprehensive metabolic panel, Lipid panel  Chronic pain of left knee - Plan: Ambulatory referral to Orthopedic Surgery  Screening for HIV without presence of risk factors - Plan: HIV Antibody (routine testing w rflx)  Encounter for hepatitis C screening test for low risk patient - Plan: Hepatitis C antibody  Screen for colon cancer - Plan: Ambulatory referral to Gastroenterology   Well 50 y.o. male. Counseled on diet and exercise. Counseled on risks and benefits of prostate cancer screening with PSA. The patient agrees to forego screening.  Flu shot today. Covid vaccine rec'd, he politely declined. Advanced directive form provided. [facility]- refer GI.  Chronic knee pain- 2nd opinion w [facility].  Other orders as above. Follow up in 1 yr pending the above workup. The patient voiced understanding and agreement to the plan.  [place], DO 02/16/21 8:09 AM

## 2021-02-16 NOTE — Patient Instructions (Addendum)
Give [name] 2-3 business days to get the results of your labs back.   Keep the diet clean and stay active.  If you do not hear anything about your referrals in the next 1-2 weeks, call our office and ask for an update.  Wear your seatbelt!  Scan your advanced directive to MyChart when you complete it please.   Let us know if you need anything.

## 2021-02-16 NOTE — Addendum Note (Signed)
Addended by: Scharlene Gloss B on: 02/16/2021 08:11 AM   Modules accepted: Orders

## 2021-03-16 ENCOUNTER — Ambulatory Visit: Payer: BC Managed Care – PPO | Admitting: Family Medicine

## 2021-03-16 ENCOUNTER — Encounter: Payer: Self-pay | Admitting: Family Medicine

## 2021-03-16 VITALS — BP 120/80 | HR 45 | Temp 97.7°F | Ht 69.0 in | Wt 262.5 lb

## 2021-03-16 DIAGNOSIS — F339 Major depressive disorder, recurrent, unspecified: Secondary | ICD-10-CM

## 2021-03-16 DIAGNOSIS — F411 Generalized anxiety disorder: Secondary | ICD-10-CM

## 2021-03-16 DIAGNOSIS — R5383 Other fatigue: Secondary | ICD-10-CM | POA: Diagnosis not present

## 2021-03-16 LAB — COMPREHENSIVE METABOLIC PANEL
ALT: 16 U/L (ref 0–53)
AST: 12 U/L (ref 0–37)
Albumin: 4.5 g/dL (ref 3.5–5.2)
Alkaline Phosphatase: 66 U/L (ref 39–117)
BUN: 15 mg/dL (ref 6–23)
CO2: 29 mEq/L (ref 19–32)
Calcium: 9.4 mg/dL (ref 8.4–10.5)
Chloride: 103 mEq/L (ref 96–112)
Creatinine, Ser: 0.98 mg/dL (ref 0.40–1.50)
GFR: 91.4 mL/min (ref >60.00)
Glucose, Bld: 82 mg/dL (ref 70–99)
Potassium: 4.4 mEq/L (ref 3.5–5.1)
Sodium: 138 mEq/L (ref 135–145)
Total Bilirubin: 0.7 mg/dL (ref 0.2–1.2)
Total Protein: 7.2 g/dL (ref 6.0–8.3)

## 2021-03-16 LAB — CBC
HCT: 41.2 % (ref 39.0–52.0)
Hemoglobin: 13.6 g/dL (ref 13.0–17.0)
MCHC: 33 g/dL (ref 30.0–36.0)
MCV: 88 fl (ref 78.0–100.0)
Platelets: 257 10*3/uL (ref 150.0–400.0)
RBC: 4.69 Mil/uL (ref 4.22–5.81)
RDW: 13.3 % (ref 11.5–15.5)
WBC: 6.4 10*3/uL (ref 4.0–10.5)

## 2021-03-16 LAB — VITAMIN D 25 HYDROXY (VIT D DEFICIENCY, FRACTURES): VITD: 31.5 ng/mL (ref 30.00–100.00)

## 2021-03-16 LAB — TSH: TSH: 2.34 u[IU]/mL (ref 0.35–5.50)

## 2021-03-16 MED ORDER — VENLAFAXINE HCL ER 37.5 MG PO CP24: 37.5000 mg | ORAL_CAPSULE | Freq: Every day | ORAL | 2 refills | Status: DC

## 2021-03-16 NOTE — Progress Notes (Signed)
Chief Complaint  Patient presents with   Fatigue   Anxiety    Subjective Devin [name] is an 51 y.o. male who presents with anxiety.  Symptoms began 3 mo ago.  Anxiety symptoms: difficulty concentrating, insomnia, irritable, psychomotor agitation, racing thoughts. Depressive symptoms depressed mood,. Family history significant for  anxiety in her sister . Possible organic causes contributing are: none Dad is a stressor as he is an alcoholic and drug addict. Work is stressful.  He is has been on a medication before but stopped when he never felt he needed.  He is not following with a psychologist. He has associated fatigue.  He has a history of sleep apnea and is waiting on a CPAP machine.  Past Medical History:  Diagnosis Date   Atrial fibrillation (HCC)    Constipation    GERD (gastroesophageal reflux disease)    History of MRSA infection    Hypertension    controlled   Keratoconus of both eyes    Tremor      Family History Family History  Problem Relation Age of Onset   Diabetes Mother    Hyperlipidemia Mother    Heart disease Mother        CHF   Thyroid disease Mother        Graves Disease   Cancer Mother    Alcoholism Mother    Hypertension Father    Alcoholism Father    Drug abuse Father    Obesity Father    Brain cancer Brother        GBM   Asthma Daughter    ADD / ADHD Daughter     Exam BP 120/80    Pulse (!) 45    Temp 97.7 F (36.5 C) (Oral)    Ht 5\' 9"  (1.753 m)    Wt 262 lb 8 oz (119.1 kg)    SpO2 99%    BMI 38.76 kg/m  General:  well developed, well nourished, in no apparent distress Heart: Regular rhythm, bradycardic. Lungs:  CTAB. normal respiratory effort without accessory muscle use Psych: well oriented with normal range of affect and age-appropriate judgement/insight  Assessment and Plan  GAD (generalized anxiety disorder) - Plan: venlafaxine XR (EFFEXOR XR) 37.5 MG 24 hr capsule  Depression, recurrent (HCC) - Plan: venlafaxine XR  (EFFEXOR XR) 37.5 MG 24 hr capsule  Fatigue, unspecified type - Plan: CBC, Comprehensive metabolic panel, VITAMIN D 25 Hydroxy (Vit-D Deficiency, Fractures), TSH  1/2. Chronic, uncontrolled.  Counseled on adjunctive treatment with exercise/physical activity.  Counseling information provided in the AVS.  Start Effexor 37.5 mg daily. F/u in 1 mo. 3.  I think this could be related to the above diagnoses in addition to untreated sleep apnea.  We will rule out metabolic causes. Patient voiced understanding and agreement to the plan.  [place], DO 03/16/21 10:44 AM

## 2021-03-16 NOTE — Patient Instructions (Addendum)
Please consider counseling. Contact [phone] to schedule an appointment or inquire about cost/insurance coverage.  [facility] located at [address], [address], [place], [place].  Phone number = [phone].  Dr. Regan Lemming - Adult Psychiatry.    [facility] located at [address] [place], [place], [place]. Phone number = [phone].   [facility] located at [address], [place], [place].  Phone number = [phone].   [facility] located at [address] [place], [place], [place].  Phone number = [phone].  Aim to do some physical exertion for 150 minutes per week. This is typically divided into 5 days per week, 30 minutes per day. The activity should be enough to get your heart rate up. Anything is better than nothing if you have time constraints.  Give [name] 2-3 business days to get the results of your labs back.   Let us know if you need anything.

## 2021-04-01 DIAGNOSIS — M25562 Pain in left knee: Secondary | ICD-10-CM | POA: Diagnosis not present

## 2021-04-19 ENCOUNTER — Ambulatory Visit: Payer: BC Managed Care – PPO | Admitting: Family Medicine

## 2021-04-19 ENCOUNTER — Encounter: Payer: Self-pay | Admitting: Family Medicine

## 2021-04-19 VITALS — BP 122/78 | HR 67 | Temp 97.8°F | Ht 70.0 in | Wt 266.0 lb

## 2021-04-19 DIAGNOSIS — F339 Major depressive disorder, recurrent, unspecified: Secondary | ICD-10-CM

## 2021-04-19 DIAGNOSIS — F411 Generalized anxiety disorder: Secondary | ICD-10-CM | POA: Diagnosis not present

## 2021-04-19 MED ORDER — VENLAFAXINE HCL ER 75 MG PO CP24: 75.0000 mg | ORAL_CAPSULE | Freq: Every day | ORAL | 2 refills | Status: DC

## 2021-04-19 NOTE — Progress Notes (Signed)
Chief Complaint  Patient presents with   Follow-up    1 month    Subjective Devin [name] presents for f/u anxiety/depression.  Pt is currently being treated with Effexor XR 37.5 mg/d.  Reports doing around 55% better since treatment. No thoughts of harming self or others. No self-medication with alcohol, prescription drugs or illicit drugs. Pt is not following with a counselor/psychologist.  Past Medical History:  Diagnosis Date   Atrial fibrillation (HCC)    Constipation    GERD (gastroesophageal reflux disease)    History of MRSA infection    Hypertension    controlled   Keratoconus of both eyes    Tremor    Allergies as of 04/19/2021       Reactions   Shellfish Allergy Nausea And Vomiting   shrimp   Penicillins Swelling, Rash   Has patient had a PCN reaction causing immediate rash, facial/tongue/throat swelling, SOB or lightheadedness with hypotension: Yes Has patient had a PCN reaction causing severe rash involving mucus membranes or skin necrosis: No Has patient had a PCN reaction that required hospitalization: No Has patient had a PCN reaction occurring within the last 10 years: No If all of the above answers are "NO", then may proceed with Cephalosporin use. REACTION: causes rash and sob.        Medication List        Accurate as of April 19, 2021  7:55 AM. If you have any questions, ask your nurse or doctor.          flecainide 50 MG tablet Commonly known as: TAMBOCOR Take 1 tablet (50 mg total) by mouth 2 (two) times daily.   losartan 25 MG tablet Commonly known as: COZAAR Take 1 tablet (25 mg total) by mouth daily.   nitroGLYCERIN 0.4 MG SL tablet Commonly known as: NITROSTAT Place 1 tablet (0.4 mg total) under the tongue every 5 (five) minutes as needed for chest pain.   propranolol 10 MG tablet Commonly known as: INDERAL Take 1 tablet (10 mg total) by mouth 3 (three) times daily.   venlafaxine XR 75 MG 24 hr capsule Commonly known  as: Effexor XR Take 1 capsule (75 mg total) by mouth daily with breakfast. What changed:  medication strength how much to take Changed by: Sharlene Dory, DO   Vitamin D (Ergocalciferol) 1.25 MG (50000 UNIT) Caps capsule Commonly known as: DRISDOL Take 1 capsule (50,000 Units total) by mouth every 7 (seven) days.        Exam BP 122/78    Pulse 67    Temp 97.8 F (36.6 C) (Oral)    Ht 5\' 10"  (1.778 m)    Wt 266 lb (120.7 kg)    SpO2 99%    BMI 38.17 kg/m  General:  well developed, well nourished, in no apparent distress Lungs:  No respiratory distress Psych: well oriented with normal range of affect and age-appropriate judgement/insight, alert and oriented x4.  Assessment and Plan  GAD (generalized anxiety disorder) - Plan: venlafaxine XR (EFFEXOR XR) 75 MG 24 hr capsule  Depression, recurrent (HCC) - Plan: venlafaxine XR (EFFEXOR XR) 75 MG 24 hr capsule  Chronic, not quite controlled yet. Will increase dosage of Effexor XR from 37.5 mg/d to 75 mg/d. Counseled on exercise. Will send message with how he is doing in 3-4 weeks.  [facility] contact info given.  F/u in 6 mo. The patient voiced understanding and agreement to the plan.  [place], DO 04/19/21 7:55 AM

## 2021-04-19 NOTE — Patient Instructions (Addendum)
Send me a message in 3-4 weeks if you are doing well and request a 90 day supply.  I am able to do cortisone injections into the knee if you wish.  Aim to do some physical exertion for 150 minutes per week. This is typically divided into 5 days per week, 30 minutes per day. The activity should be enough to get your heart rate up. Anything is better than nothing if you have time constraints.  Please call the GI team to schedule an appointment regarding your colon cancer screening: [phone]  Let us know if you need anything.

## 2021-04-24 DIAGNOSIS — S60031A Contusion of right middle finger without damage to nail, initial encounter: Secondary | ICD-10-CM | POA: Diagnosis not present

## 2021-05-03 ENCOUNTER — Ambulatory Visit: Payer: BC Managed Care – PPO | Admitting: Cardiology

## 2021-05-13 ENCOUNTER — Other Ambulatory Visit: Payer: Self-pay

## 2021-05-13 ENCOUNTER — Encounter: Payer: Self-pay | Admitting: Cardiology

## 2021-05-13 ENCOUNTER — Ambulatory Visit: Payer: BC Managed Care – PPO | Admitting: Cardiology

## 2021-05-13 VITALS — BP 130/80 | HR 52 | Ht 70.0 in | Wt 272.4 lb

## 2021-05-13 DIAGNOSIS — G4733 Obstructive sleep apnea (adult) (pediatric): Secondary | ICD-10-CM

## 2021-05-13 DIAGNOSIS — E669 Obesity, unspecified: Secondary | ICD-10-CM

## 2021-05-13 DIAGNOSIS — Z79899 Other long term (current) drug therapy: Secondary | ICD-10-CM | POA: Diagnosis not present

## 2021-05-13 DIAGNOSIS — I10 Essential (primary) hypertension: Secondary | ICD-10-CM | POA: Diagnosis not present

## 2021-05-13 DIAGNOSIS — I480 Paroxysmal atrial fibrillation: Secondary | ICD-10-CM | POA: Diagnosis not present

## 2021-05-13 NOTE — Patient Instructions (Signed)
Medication Instructions:  ?Your physician recommends that you continue on your current medications as directed. Please refer to the Current Medication list given to you today.  ?*If you need a refill on your cardiac medications before your next appointment, please call your pharmacy* ? ? ?Lab Work: ?Your physician recommends that you return for lab work in:  ?TODAY: Flecainide  ?If you have labs (blood work) drawn today and your tests are completely normal, you will receive your results only by: ?MyChart Message (if you have MyChart) OR ?A paper copy in the mail ?If you have any lab test that is abnormal or we need to change your treatment, we will call you to review the results. ? ? ?Testing/Procedures: ?None ? ? ?Follow-Up: ?At [facility], you and your health needs are our priority.  As part of our continuing mission to provide you with exceptional heart care, we have created designated Provider Care Teams.  These Care Teams include your primary Cardiologist (physician) and Advanced Practice Providers (APPs -  Physician Assistants and Nurse Practitioners) who all work together to provide you with the care you need, when you need it. ? ?We recommend signing up for the patient portal called "MyChart".  Sign up information is provided on this After Visit Summary.  MyChart is used to connect with patients for Virtual Visits (Telemedicine).  Patients are able to view lab/test results, encounter notes, upcoming appointments, etc.  Non-urgent messages can be sent to your provider as well.   ?To learn more about what you can do with MyChart, go to [email or web address].   ? ?Your next appointment:   ?1 year(s) ? ?The format for your next appointment:   ?In Person ? ?Provider:   ?Berniece Salines, DO   ? ? ?Other Instructions ?  ?

## 2021-05-13 NOTE — Progress Notes (Signed)
?Cardiology Office Note:   ? ?Date:  05/13/2021  ? ?ID:  Devin [name], DOB [birth year], MRN [id number] ? ?PCP:  [name], DO  ?Cardiologist:  Berniece Salines, DO  ?Electrophysiologist:  None  ? ?Referring MD: [name]*  ? ?" I am doing  ? ? ?History of Present Illness:   ? ?Devin [name] is a 51 y.o. male with a hx of paroxysmal atrial fibrillation no indication yet for anticoagulation has been on propanolol which also help with his essential tremors, obstructive sleep apnea waiting for his CPAP due to shortage, hypertension, obesity. ?  ?I saw the patient on February 02, 2021 at that time his CPAP was still pending.  He was hypertensive but started patient on low-dose losartan 25 mg daily, he had been experiencing atrial fibrillation with rapid ventricular rate during that visit he was in sinus rhythm so I started the patient on flecainide 50 mg twice a day. ? ?Since his visit he has not had any episodes of A-fib.  He is happy with his medication regimen.  He also has been started anti-depressive medication. ? ?No other complaints at this time.  ? ? ?Past Medical History:  ?Diagnosis Date  ? Atrial fibrillation ([place])   ? Constipation   ? GERD (gastroesophageal reflux disease)   ? History of MRSA infection   ? Hypertension   ? controlled  ? Keratoconus of both eyes   ? Tremor   ? ? ?Past Surgical History:  ?Procedure Laterality Date  ? NECK SURGERY  02/03/14  ? ? ?Current Medications: ?Current Meds  ?Medication Sig  ? flecainide (TAMBOCOR) 50 MG tablet Take 1 tablet (50 mg total) by mouth 2 (two) times daily.  ? losartan (COZAAR) 25 MG tablet Take 1 tablet (25 mg total) by mouth daily.  ? meloxicam (MOBIC) 15 MG tablet Take 15 mg by mouth daily as needed.  ? nitroGLYCERIN (NITROSTAT) 0.4 MG SL tablet Place 1 tablet (0.4 mg total) under the tongue every 5 (five) minutes as needed for chest pain.  ? propranolol (INDERAL) 10 MG tablet Take 1 tablet (10 mg total) by mouth 3 (three) times  daily.  ? venlafaxine XR (EFFEXOR XR) 75 MG 24 hr capsule Take 1 capsule (75 mg total) by mouth daily with breakfast.  ? Vitamin D, Ergocalciferol, (DRISDOL) 1.25 MG (50000 UNIT) CAPS capsule Take 1 capsule (50,000 Units total) by mouth every 7 (seven) days.  ?  ? ?Allergies:   Shellfish allergy and Penicillins  ? ?Social History  ? ?Socioeconomic History  ? Marital status: Married  ?  Spouse name: Patryck Distasi  ? Number of children: 3  ? Years of education: Not on file  ? Highest education level: Not on file  ?Occupational History  ? Occupation: Owner  ?  Employer: [employer]  ?Tobacco Use  ? Smoking status: Never  ? Smokeless tobacco: Never  ?Vaping Use  ? Vaping Use: Never used  ?Substance and Sexual Activity  ? Alcohol use: Yes  ?  Comment: Rarely  ? Drug use: Never  ? Sexual activity: Not on file  ?Other Topics Concern  ? Not on file  ?Social History Narrative  ? Regular exercise: yes (runs and uses elliptical machine)Quit school in the 8th grade.  ? One story   ? Right Handed   ? Drinks caffeine  ? ?Social Determinants of Health  ? ?Financial Resource Strain: Not on file  ?Food Insecurity: Not on file  ?Transportation Needs: Not on  file  ?Physical Activity: Not on file  ?Stress: Not on file  ?Social Connections: Not on file  ?  ? ?Family History: ?The patient's family history includes ADD / ADHD in his daughter; Alcoholism in his father and mother; Asthma in his daughter; Brain cancer in his brother; Cancer in his mother; Diabetes in his mother; Drug abuse in his father; Heart disease in his mother; Hyperlipidemia in his mother; Hypertension in his father; Obesity in his father; Thyroid disease in his mother. ? ?ROS:   ?Review of Systems  ?Constitution: Negative for decreased appetite, fever and weight gain.  ?HENT: Negative for congestion, ear discharge, hoarse voice and sore throat.   ?Eyes: Negative for discharge, redness, vision loss in right eye and visual halos.  ?Cardiovascular: Negative for  chest pain, dyspnea on exertion, leg swelling, orthopnea and palpitations.  ?Respiratory: Negative for cough, hemoptysis, shortness of breath and snoring.   ?Endocrine: Negative for heat intolerance and polyphagia.  ?Hematologic/Lymphatic: Negative for bleeding problem. Does not bruise/bleed easily.  ?Skin: Negative for flushing, nail changes, rash and suspicious lesions.  ?Musculoskeletal: Negative for arthritis, joint pain, muscle cramps, myalgias, neck pain and stiffness.  ?Gastrointestinal: Negative for abdominal pain, bowel incontinence, diarrhea and excessive appetite.  ?Genitourinary: Negative for decreased libido, genital sores and incomplete emptying.  ?Neurological: Negative for brief paralysis, focal weakness, headaches and loss of balance.  ?Psychiatric/Behavioral: Negative for altered mental status, depression and suicidal ideas.  ?Allergic/Immunologic: Negative for HIV exposure and persistent infections.  ? ? ?EKGs/Labs/Other Studies Reviewed:   ? ?The following studies were reviewed today: ? ? ?EKG:  None today  ? ?Zio monitor 08/2020 ?Sinus rhythm ?Nocturnal sinus bradycardia ?Rare premature ventricular contractions ?No afib ?No sustained arrhythmias ?  ?TTE 08/12/2019 IMPRESSIONS  ? 1. Left ventricular ejection fraction, by estimation, is 60 to 65%. The  ?left ventricle has normal function. The left ventricle has no regional  ?wall motion abnormalities. Left ventricular diastolic parameters were  ?normal.  ? 2. Right ventricular systolic function is normal. The right ventricular  ?size is normal. There is normal pulmonary artery systolic pressure. The  ?estimated right ventricular systolic pressure is Q000111Q mmHg.  ? 3. The mitral valve is normal in structure. No evidence of mitral valve  ?regurgitation.  ? 4. The aortic valve is tricuspid. Aortic valve regurgitation is not  ?visualized. No aortic stenosis is present.  ? 5. The inferior vena cava is normal in size with <50% respiratory  ?variability,  suggesting right atrial pressure of 8 mmHg.  ? ?FINDINGS  ? Left Ventricle: Left ventricular ejection fraction, by estimation, is 60  ?to 65%. The left ventricle has normal function. The left ventricle has no  ?regional wall motion abnormalities. The left ventricular internal cavity  ?size was normal in size. There is  ? no left ventricular hypertrophy. Left ventricular diastolic parameters  ?were normal.  ? ?Right Ventricle: The right ventricular size is normal. No increase in  ?right ventricular wall thickness. Right ventricular systolic function is  ?normal. There is normal pulmonary artery systolic pressure. The tricuspid  ?regurgitant velocity is 2.14 m/s, and  ? with an assumed right atrial pressure of 3 mmHg, the estimated right  ?ventricular systolic pressure is Q000111Q mmHg.  ? ?Left Atrium: Left atrial size was normal in size.  ? ?Right Atrium: Right atrial size was normal in size.  ? ?Pericardium: There is no evidence of pericardial effusion.  ? ?Mitral Valve: The mitral valve is normal in structure. No evidence of  ?  mitral valve regurgitation.  ? ?Tricuspid Valve: The tricuspid valve is normal in structure. Tricuspid  ?valve regurgitation is trivial.  ? ?Aortic Valve: The aortic valve is tricuspid. Aortic valve regurgitation is  ?not visualized. No aortic stenosis is present.  ? ?Pulmonic Valve: The pulmonic valve was not well visualized. Pulmonic valve  ?regurgitation is not visualized.  ? ?Aorta: The aortic root is normal in size and structure.  ? ?Venous: The inferior vena cava is normal in size with less than 50%  ?respiratory variability, suggesting right atrial pressure of 8 mmHg.  ? ?IAS/Shunts: The interatrial septum was not well visualized.   ? ?Recent Labs: ?02/02/2021: Magnesium 2.2 ?03/16/2021: ALT 16; BUN 15; Creatinine, Ser 0.98; Hemoglobin 13.6; Platelets 257.0; Potassium 4.4; Sodium 138; TSH 2.34  ?Recent Lipid Panel ?   ?Component Value Date/Time  ? CHOL 187 02/16/2021 0805  ? CHOL 168  06/10/2020 1415  ? TRIG 66.0 02/16/2021 0805  ? HDL 62.50 02/16/2021 0805  ? HDL 58 06/10/2020 1415  ? CHOLHDL 3 02/16/2021 0805  ? VLDL 13.2 02/16/2021 0805  ? [place] 112 (H) 02/16/2021 0805  ? [place] 93 04/

## 2021-05-23 LAB — FLECAINIDE LEVEL: Flecainide: 0.17 ug/ml — ABNORMAL LOW (ref 0.20–1.00)

## 2021-06-19 ENCOUNTER — Other Ambulatory Visit: Payer: Self-pay | Admitting: Family Medicine

## 2021-06-19 DIAGNOSIS — F411 Generalized anxiety disorder: Secondary | ICD-10-CM

## 2021-06-19 DIAGNOSIS — F339 Major depressive disorder, recurrent, unspecified: Secondary | ICD-10-CM

## 2021-06-23 ENCOUNTER — Encounter: Payer: Self-pay | Admitting: Family Medicine

## 2021-06-23 DIAGNOSIS — F411 Generalized anxiety disorder: Secondary | ICD-10-CM

## 2021-06-23 DIAGNOSIS — F339 Major depressive disorder, recurrent, unspecified: Secondary | ICD-10-CM

## 2021-06-23 MED ORDER — VENLAFAXINE HCL ER 75 MG PO CP24: 75.0000 mg | ORAL_CAPSULE | Freq: Every day | ORAL | 2 refills | Status: DC

## 2021-06-24 ENCOUNTER — Other Ambulatory Visit: Payer: Self-pay | Admitting: Family Medicine

## 2021-06-24 MED ORDER — VENLAFAXINE HCL ER 37.5 MG PO CP24: 37.5000 mg | ORAL_CAPSULE | Freq: Every day | ORAL | 2 refills | Status: DC

## 2021-09-28 ENCOUNTER — Encounter (INDEPENDENT_AMBULATORY_CARE_PROVIDER_SITE_OTHER): Payer: Self-pay

## 2021-10-19 ENCOUNTER — Ambulatory Visit: Payer: BC Managed Care – PPO | Admitting: Family Medicine

## 2021-10-19 ENCOUNTER — Encounter: Payer: Self-pay | Admitting: Family Medicine

## 2021-10-19 VITALS — BP 120/78 | HR 57 | Temp 97.6°F | Ht 70.0 in | Wt 290.2 lb

## 2021-10-19 DIAGNOSIS — F339 Major depressive disorder, recurrent, unspecified: Secondary | ICD-10-CM

## 2021-10-19 DIAGNOSIS — G8929 Other chronic pain: Secondary | ICD-10-CM | POA: Diagnosis not present

## 2021-10-19 DIAGNOSIS — M25562 Pain in left knee: Secondary | ICD-10-CM | POA: Diagnosis not present

## 2021-10-19 DIAGNOSIS — F411 Generalized anxiety disorder: Secondary | ICD-10-CM

## 2021-10-19 MED ORDER — TADALAFIL 20 MG PO TABS: 10.0000 mg | ORAL_TABLET | ORAL | 2 refills | Status: DC | PRN

## 2021-10-19 MED ORDER — MELOXICAM 15 MG PO TABS: 15.0000 mg | ORAL_TABLET | Freq: Every day | ORAL | 2 refills | Status: DC | PRN

## 2021-10-19 NOTE — Patient Instructions (Signed)
Don't take nitro on the tadalafil.   Keep the diet clean and stay active.  Aim to do some physical exertion for 150 minutes per week. This is typically divided into 5 days per week, 30 minutes per day. The activity should be enough to get your heart rate up. Anything is better than nothing if you have time constraints.  OK to take Tylenol 1000 mg (2 extra strength tabs) or 975 mg (3 regular strength tabs) every 6 hours as needed.  Please consider counseling. Contact [phone] to schedule an appointment or inquire about cost/insurance coverage.  [facility] located at [address], [address], [place], [place].  Phone number = [phone].  Dr. Regan Lemming - Adult Psychiatry.    [facility] located at [address] [place], [place], [place]. Phone number = [phone].   [facility] located at [address] [place], [place].  Phone number = [phone].   [facility] located at [address] [place], [place], [place].  Phone number = [phone].  Let us know if you need anything.

## 2021-10-19 NOTE — Progress Notes (Signed)
Chief Complaint  Patient presents with   Follow-up    6 month    Subjective Devin [name] presents for f/u anxiety/depression.  Pt is currently being treated with Effexor XR 37.5 mg/d.  Reports doing well since treatment. Having some ED and decreased sex drive.  No thoughts of harming self or others. No self-medication with alcohol, prescription drugs or illicit drugs. Pt is not following with a counselor/psychologist.  Chronic L knee pain Hx of chronic L knee pain. He takes meloxicam 15 mg around every other day. No recent inj or change in activity. No swelling, bruising, redness. He has gained wt.   Past Medical History:  Diagnosis Date   Atrial fibrillation (HCC)    Constipation    GERD (gastroesophageal reflux disease)    History of MRSA infection    Hypertension    controlled   Keratoconus of both eyes    Tremor    Allergies as of 10/19/2021       Reactions   Shellfish Allergy Nausea And Vomiting   shrimp   Penicillins Swelling, Rash   Has patient had a PCN reaction causing immediate rash, facial/tongue/throat swelling, SOB or lightheadedness with hypotension: Yes Has patient had a PCN reaction causing severe rash involving mucus membranes or skin necrosis: No Has patient had a PCN reaction that required hospitalization: No Has patient had a PCN reaction occurring within the last 10 years: No If all of the above answers are "NO", then may proceed with Cephalosporin use. REACTION: causes rash and sob.        Medication List        Accurate as of October 19, 2021  8:51 AM. If you have any questions, ask your nurse or doctor.          flecainide 50 MG tablet Commonly known as: TAMBOCOR Take 1 tablet (50 mg total) by mouth 2 (two) times daily.   losartan 25 MG tablet Commonly known as: COZAAR Take 1 tablet (25 mg total) by mouth daily.   meloxicam 15 MG tablet Commonly known as: MOBIC Take 1 tablet (15 mg total) by mouth daily as needed. What  changed: Another medication with the same name was removed. Continue taking this medication, and follow the directions you see here. Changed by: Sharlene Dory, DO   nitroGLYCERIN 0.4 MG SL tablet Commonly known as: NITROSTAT Place 1 tablet (0.4 mg total) under the tongue every 5 (five) minutes as needed for chest pain.   propranolol 10 MG tablet Commonly known as: INDERAL Take 1 tablet (10 mg total) by mouth 3 (three) times daily.   tadalafil 20 MG tablet Commonly known as: CIALIS Take 0.5-1 tablets (10-20 mg total) by mouth every other day as needed for erectile dysfunction. Started by: Sharlene Dory, DO   venlafaxine XR 37.5 MG 24 hr capsule Commonly known as: Effexor XR Take 1 capsule (37.5 mg total) by mouth daily with breakfast.   Vitamin D (Ergocalciferol) 1.25 MG (50000 UNIT) Caps capsule Commonly known as: DRISDOL Take 1 capsule (50,000 Units total) by mouth every 7 (seven) days.        Exam BP 120/78   Pulse (!) 57   Temp 97.6 F (36.4 C) (Oral)   Ht 5\' 10"  (1.778 m)   Wt 290 lb 4 oz (131.7 kg)   SpO2 98%   BMI 41.65 kg/m  General:  well developed, well nourished, in no apparent distress Lungs:  No respiratory distress Psych: well oriented with normal range  of affect and age-appropriate judgement/insight, alert and oriented x4.  Assessment and Plan  GAD (generalized anxiety disorder)  Depression, recurrent (HCC)  Chronic pain of left knee  1/2. Chronic, stable. Cont Effexor XR 37.5 mg/d. I do think he is having an AE causing ED. Will send in tadalafil 10-20 mg qd prn to combat this. Counseling info provided. 3. Counseled on exercise. Cont meloxicam 15 mg qd prn.  F/u in 6 mo for a CPE or prn.  The patient voiced understanding and agreement to the plan.  Jilda Roche [place], DO 10/19/21 8:51 AM

## 2021-11-07 ENCOUNTER — Emergency Department (HOSPITAL_BASED_OUTPATIENT_CLINIC_OR_DEPARTMENT_OTHER): Payer: BC Managed Care – PPO

## 2021-11-07 ENCOUNTER — Encounter (HOSPITAL_BASED_OUTPATIENT_CLINIC_OR_DEPARTMENT_OTHER): Payer: Self-pay | Admitting: Emergency Medicine

## 2021-11-07 ENCOUNTER — Other Ambulatory Visit: Payer: Self-pay

## 2021-11-07 ENCOUNTER — Emergency Department (HOSPITAL_BASED_OUTPATIENT_CLINIC_OR_DEPARTMENT_OTHER)
Admission: EM | Admit: 2021-11-07 | Discharge: 2021-11-07 | Disposition: A | Discharge status: Home / Self Care | Payer: BC Managed Care – PPO | Attending: Emergency Medicine | Admitting: Emergency Medicine

## 2021-11-07 DIAGNOSIS — J069 Acute upper respiratory infection, unspecified: Secondary | ICD-10-CM | POA: Diagnosis not present

## 2021-11-07 DIAGNOSIS — R059 Cough, unspecified: Secondary | ICD-10-CM | POA: Diagnosis not present

## 2021-11-07 DIAGNOSIS — J189 Pneumonia, unspecified organism: Secondary | ICD-10-CM | POA: Diagnosis not present

## 2021-11-07 DIAGNOSIS — R051 Acute cough: Secondary | ICD-10-CM | POA: Diagnosis not present

## 2021-11-07 DIAGNOSIS — J209 Acute bronchitis, unspecified: Secondary | ICD-10-CM | POA: Diagnosis not present

## 2021-11-07 DIAGNOSIS — R0981 Nasal congestion: Secondary | ICD-10-CM | POA: Diagnosis not present

## 2021-11-07 DIAGNOSIS — Z20822 Contact with and (suspected) exposure to covid-19: Secondary | ICD-10-CM | POA: Diagnosis not present

## 2021-11-07 MED ORDER — DOXYCYCLINE HYCLATE 100 MG PO TABS
100.0000 mg | ORAL_TABLET | Freq: Once | ORAL | Status: AC
  Administered 2021-11-07: 100 mg via ORAL
  Filled 2021-11-07: qty 1

## 2021-11-07 MED ORDER — DOXYCYCLINE HYCLATE 100 MG PO CAPS: 100.0000 mg | ORAL_CAPSULE | Freq: Two times a day (BID) | ORAL | 0 refills | Status: DC

## 2021-11-07 MED ORDER — AZITHROMYCIN 250 MG PO TABS: 500.0000 mg | ORAL_TABLET | Freq: Once | ORAL | Status: DC

## 2021-11-07 NOTE — ED Triage Notes (Signed)
Patient arrived via POV c/o URI x 8 days. Patient seen by UC today, sent over for work up due to [place]. Patient states neg COVID/FLU test today at [facility]. Patient is AO x 4, VS w/ elevated BP, normal gait.

## 2021-11-07 NOTE — ED Provider Notes (Signed)
[facility] EMERGENCY DEPARTMENT Provider Note   CSN: [id number] Arrival date & time: 11/07/21  2033     History  Chief Complaint  Patient presents with   URI    Devin [name] is a 51 y.o. male.  Patient is a 51 year old male presenting with a 10-day history of congestion, cough.  He denies chest pain or shortness of breath.  His cough is intermittently productive.  He denies any leg swelling.  He denies any ill contacts or COVID exposures.  Patient was seen at urgent care today where x-ray revealed evidence for pneumonia.  Patient is taking flecainide, so the provider at urgent care was uncomfortable prescribing antibiotics due to risk for arrhythmia.  He was then referred here for further evaluation.    The history is provided by the patient.       Home Medications Prior to Admission medications   Medication Sig Start Date End Date Taking? Authorizing Provider  flecainide (TAMBOCOR) 50 MG tablet Take 1 tablet (50 mg total) by mouth 2 (two) times daily. 02/02/21   Tobb, Kardie, DO  losartan (COZAAR) 25 MG tablet Take 1 tablet (25 mg total) by mouth daily. 02/02/21 05/13/21  Tobb, Kardie, DO  meloxicam (MOBIC) 15 MG tablet Take 1 tablet (15 mg total) by mouth daily as needed. 10/19/21   Sharlene Dory, DO  nitroGLYCERIN (NITROSTAT) 0.4 MG SL tablet Place 1 tablet (0.4 mg total) under the tongue every 5 (five) minutes as needed for chest pain. 09/22/20 05/13/21  Tobb, Kardie, DO  propranolol (INDERAL) 10 MG tablet Take 1 tablet (10 mg total) by mouth 3 (three) times daily. 09/22/20   Tobb, Kardie, DO  tadalafil (CIALIS) 20 MG tablet Take 0.5-1 tablets (10-20 mg total) by mouth every other day as needed for erectile dysfunction. 10/19/21   Sharlene Dory, DO  venlafaxine XR (EFFEXOR XR) 37.5 MG 24 hr capsule Take 1 capsule (37.5 mg total) by mouth daily with breakfast. 06/24/21   Wendling, Jilda Roche, DO  Vitamin D, Ergocalciferol, (DRISDOL) 1.25 MG (50000  UNIT) CAPS capsule Take 1 capsule (50,000 Units total) by mouth every 7 (seven) days. 07/27/20   Helane Rima, DO      Allergies    Shellfish allergy and Penicillins    Review of Systems   Review of Systems  All other systems reviewed and are negative.   Physical Exam Updated Vital Signs BP (!) 151/97 (BP Location: Right Arm)   Pulse 65   Temp 98.7 F (37.1 C) (Oral)   Resp 18   Ht 5\' 10"  (1.778 m)   Wt 131.5 kg   SpO2 98%   BMI 41.61 kg/m  Physical Exam Vitals and nursing note reviewed.  Constitutional:      General: He is not in acute distress.    Appearance: He is well-developed. He is not diaphoretic.  HENT:     Head: Normocephalic and atraumatic.  Cardiovascular:     Rate and Rhythm: Normal rate and regular rhythm.     Heart sounds: No murmur heard.    No friction rub.  Pulmonary:     Effort: Pulmonary effort is normal. No respiratory distress.     Breath sounds: Normal breath sounds. No wheezing or rales.  Abdominal:     General: Bowel sounds are normal. There is no distension.     Palpations: Abdomen is soft.     Tenderness: There is no abdominal tenderness.  Musculoskeletal:        General:  Normal range of motion.     Cervical back: Normal range of motion and neck supple.  Skin:    General: Skin is warm and dry.  Neurological:     Mental Status: He is alert and oriented to person, place, and time.     Coordination: Coordination normal.     ED Results / Procedures / Treatments   Labs (all labs ordered are listed, but only abnormal results are displayed) Labs Reviewed - No data to display  EKG None  Radiology DG Chest 2 View  Result Date: 11/07/2021 CLINICAL DATA:  Cough and congestion. EXAM: CHEST - 2 VIEW COMPARISON:  Portable chest 07/21/2020, PA Lat chest 07/30/2019 FINDINGS: The heart size and mediastinal contours are within normal limits. There is increased opacity in the right mid perihilar area on the PA view suspicious for a small  pneumonia. This is not well localized on the lateral view and could be within the right upper lobe or the right lower lobe mid field. The remaining lungs are clear. The sulci are sharp. There is slight thoracic dextroscoliosis, thoracic spondylosis and partially visible anterior fusion plating in the lower cervical spine. IMPRESSION: Increased opacity right mid perihilar area suspicious for a small pneumonia. Clinical correlation and radiographic follow-up recommended. Electronically Signed   By: Telford Nab M.D.   On: 11/07/2021 23:05    Procedures Procedures    Medications Ordered in ED Medications  doxycycline (VIBRA-TABS) tablet 100 mg (has no administration in time range)    ED Course/ Medical Decision Making/ A&P  Patient presenting with cough and congestion for the past 10 days.  COVID test that urgent care was negative, but x-ray showed what appeared to be an infiltrate.  Due to concerns over his taking flecainide, he was referred here for definitive care.  Patient arrives here with stable vital signs and no hypoxia.  He is in no respiratory distress.  Lungs are essentially clear with no leg edema.  Chest x-ray here shows what appears to be an infiltrate in the right middle lobe.  I feel comfortable prescribing doxycycline and do not feel that the patient requires further work-up.  There is no hypoxia and he has no other complaints.  Final Clinical Impression(s) / ED Diagnoses Final diagnoses:  None    Rx / DC Orders ED Discharge Orders     None         Veryl Speak, MD 11/07/21 2319

## 2021-11-07 NOTE — Discharge Instructions (Addendum)
Begin taking doxycycline as prescribed.  Over-the-counter medications as needed for symptom relief.  Return to the ER if you develop severe chest pain, difficulty breathing, or for other new and concerning symptoms.

## 2021-11-08 ENCOUNTER — Telehealth: Payer: Self-pay

## 2021-11-08 NOTE — Telephone Encounter (Signed)
Pt seen at ED 

## 2021-11-08 NOTE — Telephone Encounter (Signed)
Nurse Assessment Nurse: Aurelio Jew, RN, Morgyn Date/Time Eilene Ghazi Time): 11/07/2021 7:44:36 PM Confirm and document reason for call. If symptomatic, describe symptoms. ---Caller states husband was sick for a week, Went to UC, Diagnosed with form of pneumonia via chest xray. They called his wife just now saying go to ER now to assess abx need. they want to Rx antibiotic but allergic to penicillin and has heart issues. Needs to know which antibiotic to Rx. Sinus congestion and back hurting. Does the patient have any new or worsening symptoms? ---Yes Will a triage be completed? ---Yes Related visit to physician within the last 2 weeks? ---Yes Does the PT have any chronic conditions? (i.e. diabetes, asthma, this includes High risk factors for pregnancy, etc.) ---Yes List chronic conditions. ---non essential tremors, afib, HTN, Depression Is this a behavioral health or substance abuse call? ---No PLEASE NOTE: All timestamps contained within this report are represented as [place] Standard Time. CONFIDENTIALTY NOTICE: This fax transmission is intended only for the addressee. It contains information that is legally privileged, confidential or otherwise protected from use or disclosure. If you are not the intended recipient, you are strictly prohibited from reviewing, disclosing, copying using or disseminating any of this information or taking any action in reliance on or regarding this information. If you have received this fax in error, please notify us immediately by telephone so that we can arrange for its return to [name]. Phone: [phone], Toll-Free: [phone], Fax: [phone] Page: 2 of 2 Call Id: [id number] Guidelines Guideline Title Affirmed Question Affirmed Notes Nurse Date/Time Eilene Ghazi Time) Pneumonia Followup Call Patient sounds very sick or weak to the triager [place], RN, [facility] 11/07/2021 7:46:21 PM Disp. Time Eilene Ghazi Time) Disposition Final User 11/07/2021 7:30:57 PM  Send to [place], RN, Maudry Mayhew 11/07/2021 7:38:29 PM Send To Clinical Follow Up Assunta Gambles, RN, Maudry Mayhew 11/07/2021 7:50:13 PM Go to ED Now (or PCP triage) Yes Aurelio Jew, RN, [place] Final Disposition 11/07/2021 7:50:13 PM Go to ED Now (or PCP triage) Yes Aurelio Jew, RN, Morgyn Caller Disagree/Comply Comply Caller Understands Yes PreDisposition Call Doctor Care Advice Given Per Guideline GO TO ED NOW (OR PCP TRIAGE): * IF NO PCP (PRIMARY CARE PROVIDER) SECOND-LEVEL TRIAGE: You need to be seen within the next hour. Go to the [facility] at _____________ [place] as soon as you can. CARE ADVICE given per Pneumonia on Antibiotic Follow-Up Call (Adult) guideline. Referrals GO TO FACILITY OTHER - SPECIFY

## 2021-11-10 ENCOUNTER — Encounter: Payer: Self-pay | Admitting: Family Medicine

## 2021-11-11 ENCOUNTER — Ambulatory Visit: Payer: BC Managed Care – PPO | Admitting: Family Medicine

## 2021-11-11 ENCOUNTER — Encounter: Payer: Self-pay | Admitting: Family Medicine

## 2021-11-11 VITALS — BP 120/80 | HR 62 | Temp 97.7°F | Resp 18 | Ht 69.0 in | Wt 285.4 lb

## 2021-11-11 DIAGNOSIS — R109 Unspecified abdominal pain: Secondary | ICD-10-CM

## 2021-11-11 MED ORDER — TIZANIDINE HCL 4 MG PO TABS: 4.0000 mg | ORAL_TABLET | Freq: Four times a day (QID) | ORAL | 0 refills | Status: DC | PRN

## 2021-11-11 NOTE — Progress Notes (Signed)
Musculoskeletal Exam  Patient: Devin [name] DOB: 06-May-1970  DOS: 11/11/2021  SUBJECTIVE:  Chief Complaint:   Chief Complaint  Patient presents with   ER follow up    Ride side pain.     Devin [name] is a 51 y.o.  male for evaluation and treatment of R side pain.   Onset:  1 week ago. No inj or change in activity. Dx'd w PNA.  Location: R side Character:  sharp  Progression of issue:  is unchanged Associated symptoms: abd wall pain from coughing No swelling, redness, bruising.  Treatment: to date has been Nyquil.   Neurovascular symptoms: no  Past Medical History:  Diagnosis Date   Atrial fibrillation (HCC)    Constipation    GERD (gastroesophageal reflux disease)    History of MRSA infection    Hypertension    controlled   Keratoconus of both eyes    Tremor     Objective: VITAL SIGNS: BP 120/80 (BP Location: Left Arm, Patient Position: Sitting, Cuff Size: Large)   Pulse 62   Temp 97.7 F (36.5 C) (Temporal)   Resp 18   Ht 5\' 9"  (1.753 m)   Wt 285 lb 6.4 oz (129.5 kg)   SpO2 96%   BMI 42.15 kg/m  Constitutional: Well formed, well developed. No acute distress. Thorax & Lungs: No accessory muscle use Musculoskeletal: R side.   Tenderness to palpation: yes over R lateral lower rib cage; also ttp over rectus abd muscles b/l Deformity: no Ecchymosis: no No erythema or ecchymosis Neurologic: Normal sensory function. Gait nml Psychiatric: Normal mood. Age appropriate judgment and insight. Alert & oriented x 3.    Assessment:  Side pain - Plan: tiZANidine (ZANAFLEX) 4 MG tablet  Plan: Stretches/exercises, heat, ice, Tylenol. Zanaflex, will take 1-2 hrs prior to bedtime to ensure he does not get drowsy, will avoid taking during day if it does.  Cont meloxicam. Gentle stretching of sides rec'd. PT if no better.  F/u prn. The patient voiced understanding and agreement to the plan.   [place], DO 11/11/21  10:47 AM

## 2021-11-11 NOTE — Patient Instructions (Addendum)
Ice/cold pack over area for 10-15 min twice daily.  Heat (pad or rice pillow in microwave) over affected area, 10-15 minutes twice daily.   OK to take Tylenol 1000 mg (2 extra strength tabs) or 975 mg (3 regular strength tabs) every 6 hours as needed.  Stay on the meloxicam.   Stretch the area gently.   Let us know if you need anything.

## 2021-11-14 ENCOUNTER — Other Ambulatory Visit: Payer: Self-pay | Admitting: Family Medicine

## 2021-11-14 ENCOUNTER — Encounter: Payer: Self-pay | Admitting: Family Medicine

## 2021-11-14 MED ORDER — BENZONATATE 200 MG PO CAPS: 200.0000 mg | ORAL_CAPSULE | Freq: Two times a day (BID) | ORAL | 0 refills | Status: DC | PRN

## 2021-11-15 ENCOUNTER — Other Ambulatory Visit: Payer: Self-pay | Admitting: Family Medicine

## 2021-11-15 ENCOUNTER — Encounter: Payer: Self-pay | Admitting: Family Medicine

## 2021-11-15 MED ORDER — FLUTICASONE PROPIONATE HFA 110 MCG/ACT IN AERO: 2.0000 | INHALATION_SPRAY | Freq: Two times a day (BID) | RESPIRATORY_TRACT | 1 refills | Status: DC

## 2021-11-16 ENCOUNTER — Ambulatory Visit: Payer: BC Managed Care – PPO | Admitting: Family Medicine

## 2021-11-16 ENCOUNTER — Encounter: Payer: Self-pay | Admitting: Family Medicine

## 2021-11-16 VITALS — BP 124/83 | HR 63 | Temp 97.7°F | Ht 69.0 in | Wt 283.2 lb

## 2021-11-16 DIAGNOSIS — R0789 Other chest pain: Secondary | ICD-10-CM

## 2021-11-16 DIAGNOSIS — R058 Other specified cough: Secondary | ICD-10-CM | POA: Diagnosis not present

## 2021-11-16 MED ORDER — TRAMADOL HCL 50 MG PO TABS: 50.0000 mg | ORAL_TABLET | Freq: Three times a day (TID) | ORAL | 0 refills | Status: AC | PRN

## 2021-11-16 NOTE — Patient Instructions (Signed)
Ice/cold pack over area for 10-15 min twice daily.  Heat (pad or rice pillow in microwave) over affected area, 10-15 minutes twice daily.   OK to take Tylenol 1000 mg (2 extra strength tabs) or 975 mg (3 regular strength tabs) every 6 hours as needed.  Stay on the meloxicam.  Do not drink alcohol, do any illicit/street drugs, drive or do anything that requires alertness while on this medicine.   Let us know if you need anything.

## 2021-11-16 NOTE — Progress Notes (Signed)
Chief Complaint  Patient presents with   Follow-up    Pneumonia     Subjective: Patient is a 51 y.o. male here for f/u PNA.  He is here with his wife.  Finished doxycycline.  He continues to have coughing whenever he physically exerts himself.  His voice is subsequently hoarse and his right chest wall is painful.  No bruising, redness, or swelling.  He has some intermittent wheezing.  He is not having any fevers, body aches, runny/stuffy nose, sore throat, itchy/watery eyes, sinus pain, ear pain/drainage from the ears.  He started Flovent last night.  He is using meloxicam for pain without relief.  He believes part of the reason he is not better is because he kept working and overdoing things physically.  Past Medical History:  Diagnosis Date   Atrial fibrillation (HCC)    Constipation    GERD (gastroesophageal reflux disease)    History of MRSA infection    Hypertension    controlled   Keratoconus of both eyes    Tremor     Objective: BP 124/83 (BP Location: Right Arm, Patient Position: Sitting, Cuff Size: Large)   Pulse 63   Temp 97.7 F (36.5 C) (Oral)   Ht 5\' 9"  (1.753 m)   Wt 283 lb 4 oz (128.5 kg)   SpO2 95%   BMI 41.83 kg/m  General: Awake, appears stated age HEENT: PERRLA, EOMi, sclera white, ear canals are patent without otorrhea, TMs negative bilaterally, nares are patent without rhinorrhea, MMM, no pharyngeal exudate or erythema Heart: RRR, no LE edema Lungs: CTAB, no rales, wheezes or rhonchi. No accessory muscle use MSK: TTP over the right lateral lower chest wall without crepitus, edema, or excessive warmth Psych: Age appropriate judgment and insight, normal affect and mood  Assessment and Plan: Chest wall pain - Plan: traMADol (ULTRAM) 50 MG tablet  Post-viral cough syndrome  Secondary to the cough.  Start tramadol 50 mg every 8-12 hours as needed.  Continue meloxicam.  Add Tylenol as needed.  Ice, heat, tizanidine as needed. Inhaled corticosteroid.   Rinse mouth out after use. The patient and his spouse voiced understanding and agreement to the plan.  [place], DO 11/16/21  2:36 PM

## 2021-11-22 ENCOUNTER — Ambulatory Visit: Payer: BC Managed Care – PPO | Admitting: Family Medicine

## 2021-11-22 ENCOUNTER — Encounter: Payer: Self-pay | Admitting: Family Medicine

## 2021-11-22 VITALS — BP 134/88 | HR 64 | Temp 98.2°F | Ht 69.0 in | Wt 282.0 lb

## 2021-11-22 DIAGNOSIS — R058 Other specified cough: Secondary | ICD-10-CM | POA: Diagnosis not present

## 2021-11-22 DIAGNOSIS — R0789 Other chest pain: Secondary | ICD-10-CM

## 2021-11-22 MED ORDER — PROMETHAZINE-DM 6.25-15 MG/5ML PO SYRP: 5.0000 mL | ORAL_SOLUTION | Freq: Four times a day (QID) | ORAL | 0 refills | Status: DC | PRN

## 2021-11-22 MED ORDER — PREDNISONE 20 MG PO TABS
40.0000 mg | ORAL_TABLET | Freq: Every day | ORAL | 0 refills | Status: AC
Start: 2021-11-22 — End: 2021-11-27

## 2021-11-22 NOTE — Progress Notes (Signed)
Chief Complaint  Patient presents with   Rib pain    Subjective: Patient is a 51 y.o. male here for f/u rib pain.  Dx'd w pneumonia a little over 2 weeks ago.  He is having lingering coughing, right-sided chest wall pain, some nasal congestion, and fatigue.  He has been using Flovent twice daily with little relief.  He tried [employer] with little relief as well.  He has finished a course of doxycycline use to treat his pneumonia.  He is not having any fevers, sore throat, ear pain/drainage, runny nose, myalgias, nausea, or vomiting.  Past Medical History:  Diagnosis Date   Atrial fibrillation (HCC)    Constipation    GERD (gastroesophageal reflux disease)    History of MRSA infection    Hypertension    controlled   Keratoconus of both eyes    Tremor     Objective: BP 134/88 (BP Location: Left Arm, Patient Position: Sitting, Cuff Size: Large)   Pulse 64   Temp 98.2 F (36.8 C) (Oral)   Ht 5\' 9"  (1.753 m)   Wt 282 lb (127.9 kg)   SpO2 97%   BMI 41.64 kg/m  General: Awake, appears stated age Heart: RRR, no LE edema Lungs: Faint expiratory wheezes heard at bases, no rales or rhonchi. No accessory muscle use Skin: No external lesions over the right lower rib cage Psych: Age appropriate judgment and insight, normal affect and mood  Assessment and Plan: Chest wall pain - Plan: predniSONE (DELTASONE) 20 MG tablet  Post-viral cough syndrome - Plan: promethazine-dextromethorphan (PROMETHAZINE-DM) 6.25-15 MG/5ML syrup  Hold meloxicam, 5-day prednisone burst 40 mg daily.  Ice, heat, Tylenol. Stop Tessalon Perles, start syrup as above.  Hold Flovent while on prednisone.  Could consider adding a long-acting beta agonist versus chest x-ray versus PFTs. The patient voiced understanding and agreement to the plan.  [place], DO 11/22/21  11:55 AM

## 2021-11-22 NOTE — Patient Instructions (Addendum)
Send me a message Friday morning if we are not turning the corner.   Ice/cold pack over area for 10-15 min twice daily.  Heat (pad or rice pillow in microwave) over affected area, 10-15 minutes twice daily.   Stretch the side at least twice daily. Hold for 30 seconds and repeat twice.  Hold meloxicam while on the prednisone.  OK to take Tylenol 1000 mg (2 extra strength tabs) or 975 mg (3 regular strength tabs) every 6 hours as needed.  The syrup can make you drowsy, plan accordingly.   Let us know if you need anything.

## 2021-11-24 ENCOUNTER — Telehealth: Payer: Self-pay

## 2021-11-24 NOTE — Telephone Encounter (Signed)
Nurse Assessment Nurse: Alveta Heimlich, RN, Rise Paganini Date/Time (Eastern Time): 11/23/2021 5:26:00 PM Confirm and document reason for call. If symptomatic, describe symptoms. ---Caller states her husband is having pain on both flanks. He has been sick since labor day. He saw the Dr on tuesday. He was hurting on one side on the flank. He had a slight pnuemonia. No fever. Has a cough. Does the patient have any new or worsening symptoms? ---Yes Will a triage be completed? ---Yes Related visit to physician within the last 2 weeks? ---Yes Does the PT have any chronic conditions? (i.e. diabetes, asthma, this includes High risk factors for pregnancy, etc.) ---Yes List chronic conditions. ---a fib Is this a behavioral health or substance abuse call? ---No Guidelines Guideline Title Affirmed Question Affirmed Notes Nurse Date/Time (Eastern Time) Chest Pain [1] Chest pain lasts > 5 minutes AND [2] age > 42 Alveta Heimlich, [place], Rise Paganini 11/23/2021 5:30:16 PM Disp. Time Eilene Ghazi Time) Disposition Final User 11/23/2021 5:31:54 PM Call EMS 911 Now Yes Alveta Heimlich RN, [facility] 11/23/2021 5:33:16 PM 911 Outcome Documentation Alveta Heimlich, RN, Rise Paganini PLEASE NOTE: All timestamps contained within this report are represented as [place] Standard Time. CONFIDENTIALTY NOTICE: This fax transmission is intended only for the addressee. It contains information that is legally privileged, confidential or otherwise protected from use or disclosure. If you are not the intended recipient, you are strictly prohibited from reviewing, disclosing, copying using or disseminating any of this information or taking any action in reliance on or regarding this information. If you have received this fax in error, please notify us immediately by telephone so that we can arrange for its return to [name]. Phone: [phone], Toll-Free: [phone], Fax: [phone] Page: 2 of 2 Call Id: [id number] [place]. Time (Eastern Time) Disposition Final User Reason: She did not  call 911, says taking him to ER in the car Final Disposition 11/23/2021 5:31:54 PM Call EMS 911 Now Yes Alveta Heimlich, RN, [place] Disagree/Comply Comply Caller Understands Yes PreDisposition InappropriateToAsk Care Advice Given Per Guideline CALL EMS 911 NOW: * Call EMS 911 first. CARE ADVICE given per Chest Pain (Adult) guideline. * Immediate medical attention is needed. You need to hang up and call 911 (or an ambulance). Comments User: Debby Bud, RN Date/Time Eilene Ghazi Time): 11/23/2021 5:28:37 PM She states not flank, but rib pain on both sides of the back , constant. 3 or 4 hours Referrals [place] - ED

## 2021-11-29 ENCOUNTER — Encounter: Payer: Self-pay | Admitting: Family Medicine

## 2021-11-30 ENCOUNTER — Ambulatory Visit (INDEPENDENT_AMBULATORY_CARE_PROVIDER_SITE_OTHER)
Admission: RE | Admit: 2021-11-30 | Discharge: 2021-11-30 | Disposition: A | Discharge status: Home / Self Care | Payer: BC Managed Care – PPO | Source: Ambulatory Visit | Attending: Family Medicine | Admitting: Family Medicine

## 2021-11-30 ENCOUNTER — Encounter: Payer: Self-pay | Admitting: Family Medicine

## 2021-11-30 ENCOUNTER — Ambulatory Visit: Payer: BC Managed Care – PPO | Admitting: Family Medicine

## 2021-11-30 ENCOUNTER — Encounter: Payer: Self-pay | Admitting: Cardiology

## 2021-11-30 VITALS — BP 128/86 | HR 67 | Temp 98.1°F | Ht 70.0 in | Wt 283.2 lb

## 2021-11-30 DIAGNOSIS — R052 Subacute cough: Secondary | ICD-10-CM

## 2021-11-30 DIAGNOSIS — R0781 Pleurodynia: Secondary | ICD-10-CM | POA: Diagnosis not present

## 2021-11-30 DIAGNOSIS — R059 Cough, unspecified: Secondary | ICD-10-CM | POA: Diagnosis not present

## 2021-11-30 MED ORDER — HYDROCODONE BIT-HOMATROP MBR 5-1.5 MG/5ML PO SOLN: 5.0000 mL | Freq: Three times a day (TID) | ORAL | 0 refills | Status: DC | PRN

## 2021-11-30 MED ORDER — MONTELUKAST SODIUM 10 MG PO TABS: 10.0000 mg | ORAL_TABLET | Freq: Every day | ORAL | 3 refills | Status: DC

## 2021-11-30 NOTE — Progress Notes (Signed)
Chief Complaint  Patient presents with   Cough    [name] here for URI complaints.  Duration: several weeks  Associated symptoms: wheezing, chest tightness, dry cough, and chest pain Denies: sinus congestion, sinus pain, rhinorrhea, itchy watery eyes, ear pain, ear drainage, sore throat, and fevers Treatment to date: Flovent, doxy,  Sick contacts: No  Past Medical History:  Diagnosis Date   Atrial fibrillation (HCC)    Constipation    GERD (gastroesophageal reflux disease)    History of MRSA infection    Hypertension    controlled   Keratoconus of both eyes    Tremor     Objective BP 128/86 (BP Location: Left Arm, Patient Position: Sitting, Cuff Size: Normal)   Pulse 67   Temp 98.1 F (36.7 C) (Oral)   Ht 5\' 10"  (1.778 m)   Wt 283 lb 3 oz (128.5 kg)   SpO2 97%   BMI 40.63 kg/m  General: Awake, alert, appears stated age HEENT: AT, [place], ears patent b/l and TM's neg, nares patent w clear discharge, pharynx pink and without exudates, MMM Neck: No masses or asymmetry Heart: RRR Lungs: CTAB, no accessory muscle use MSK: TTP over lower lateral rib cage b/l, no crepitus or edema Psych: Age appropriate judgment and insight, normal mood and affect  Subacute cough - Plan: Ambulatory referral to Pulmonology, DG Chest 2 View, montelukast (SINGULAIR) 10 MG tablet, HYDROcodone bit-homatropine (HYCODAN) 5-1.5 MG/5ML syrup  Refer to pulm. Trial Hycodan and montelukast. OK to stop ICS and phenergan DM as it did not help. Failed Tessalon Perles also. Ck CXR. He will head to the [place] location now to avoid the facility fee with imaging today. Continue to push fluids, practice good hand hygiene, cover mouth when coughing. F/u prn. If starting to experience fevers, shaking, or shortness of breath, seek immediate care. Pt voiced understanding and agreement to the plan.  [place], DO 11/30/21 9:36 AM

## 2021-11-30 NOTE — Patient Instructions (Addendum)
Get your chest X-ray done at the [place] office.  [address], [place] [address]  Do not drink alcohol, do any illicit/street drugs, drive or do anything that requires alertness while on this medicine.   If you do not hear anything about your referral in the next 1-2 weeks, call our office and ask for an update.  Let us know if you need anything.

## 2021-12-06 ENCOUNTER — Encounter: Payer: Self-pay | Admitting: Family Medicine

## 2021-12-12 ENCOUNTER — Other Ambulatory Visit: Payer: Self-pay | Admitting: Family Medicine

## 2021-12-12 DIAGNOSIS — R052 Subacute cough: Secondary | ICD-10-CM

## 2021-12-12 MED ORDER — HYDROCODONE BIT-HOMATROP MBR 5-1.5 MG/5ML PO SOLN: 5.0000 mL | Freq: Three times a day (TID) | ORAL | 0 refills | Status: DC | PRN

## 2021-12-15 ENCOUNTER — Encounter: Payer: Self-pay | Admitting: Family Medicine

## 2021-12-15 MED ORDER — MELOXICAM 15 MG PO TABS: 15.0000 mg | ORAL_TABLET | Freq: Every day | ORAL | 2 refills | Status: DC | PRN

## 2021-12-15 MED ORDER — VENLAFAXINE HCL ER 37.5 MG PO CP24: 37.5000 mg | ORAL_CAPSULE | Freq: Every day | ORAL | 2 refills | Status: DC

## 2021-12-22 ENCOUNTER — Ambulatory Visit (INDEPENDENT_AMBULATORY_CARE_PROVIDER_SITE_OTHER): Payer: BC Managed Care – PPO | Admitting: Pulmonary Disease

## 2021-12-22 ENCOUNTER — Encounter: Payer: Self-pay | Admitting: Pulmonary Disease

## 2021-12-22 VITALS — BP 116/82 | HR 50 | Temp 97.6°F | Ht 69.0 in | Wt 290.4 lb

## 2021-12-22 DIAGNOSIS — J309 Allergic rhinitis, unspecified: Secondary | ICD-10-CM

## 2021-12-22 DIAGNOSIS — R058 Other specified cough: Secondary | ICD-10-CM | POA: Diagnosis not present

## 2021-12-22 DIAGNOSIS — G4733 Obstructive sleep apnea (adult) (pediatric): Secondary | ICD-10-CM

## 2021-12-22 DIAGNOSIS — R042 Hemoptysis: Secondary | ICD-10-CM | POA: Diagnosis not present

## 2021-12-22 NOTE — Patient Instructions (Signed)
Upper airway cough syndrome: We will treat allergic rhinitis, see below You need to try to suppress your cough to allow your larynx (voice box) to heal.  For three days don't talk, laugh, sing, or clear your throat. Do everything you can to suppress the cough during this time. Use hard candies (sugarless Jolly Ranchers) or non-mint or non-menthol containing cough drops during this time to soothe your throat.  Use a cough suppressant (Delsym or what I have prescribed you) around the clock during this time.  After three days, gradually increase the use of your voice and back off on the cough suppressants.  Hemoptysis: (Coughing up blood) CT scan of the chest  Allergic rhinitis: Use Neil Med rinses with distilled water at least twice per day using the instructions on the package. 1/2 hour after using the [facility] Med rinse, use Nasacort two puffs in each nostril once per day.  Remember that the Nasacort can take 1-2 weeks to work after regular use. Use generic zyrtec (cetirizine) every day.  If this doesn't help, then stop taking it and use chlorpheniramine-phenylephrine combination tablets.  Obstructive sleep apnea, currently unable to use CPAP due to pneumonia and sinus congestion: Start taking the allergic rhinitis treatment as detailed above After 2 weeks start trying to use CPAP again.  Follow-up in 3 to 4 weeks with a nurse practitioner to see how you are doing from a sinusitis and sinus congestion standpoint.  May need to have CT scan of the sinuses if no improvement

## 2021-12-22 NOTE — Progress Notes (Signed)
Synopsis: Referred in 12/2021 for cough  Subjective:   PATIENT ID: Devin [name] GENDER: male DOB: [birth year], MRN: [id number]   HPI  Chief Complaint  Patient presents with   Consult    New PT Seen at hospital for Pneumonia, PT states breathing is ok    Ashly says that at the beginning of September he got sick with pneumonia and went to Urgent care.  He wa sent from urgent care to the ER.  He was given an oral antibiotic.  The cough persisted for several weeks after.  The cough is now better.  One time he produced hemoptysis.  He had a "splatter", less than a teaspoon.  That happened bout 2 weeks go.  No blood since then.  No fever, no chills.  He lost a few pounds during the first week, it's back now.    He was around a bunch of people.    No heartburn or indigestion, had this prior, none now.  He has a lot of sinus congestion and post nasal drip.  He has OSA, has a full face mask, he isn't using it because he can't breathe through his nose.  He doesn't take anything for the congestion and post nasal drip.   He's gained 90 pounds in the last 1.5 years.    He works in Architect.  Never smoker.   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   He has done a little work cutting ceilings, but no significant asbestos exposure.  No factory work, no Armed forces logistics/support/administrative officer, no factory work.    His mother had lung cancer.    Record review: October 2023 primary care visit (resident clinic) reviewed where the patient was seen for subacute cough associated with sore throat.  Treated with Hycodan, montelukast, chest x-ray ordered, sent to [name] for further evaluation.  Past Medical History:  Diagnosis Date   Atrial fibrillation (HCC)    Constipation    GERD (gastroesophageal reflux disease)    History of MRSA infection    Hypertension    controlled   Keratoconus of both eyes    Tremor      Family History  Problem Relation Age of Onset   Diabetes Mother     Hyperlipidemia Mother    Heart disease Mother        CHF   Thyroid disease Mother        Graves Disease   Cancer Mother    Alcoholism Mother    Hypertension Father    Alcoholism Father    Drug abuse Father    Obesity Father    Brain cancer Brother        GBM   Asthma Daughter    ADD / ADHD Daughter      Social History   Socioeconomic History   Marital status: Married    Spouse name: Jameir Ake   Number of children: 3   Years of education: Not on file   Highest education level: Not on file  Occupational History   Occupation: Information systems manager: [employer]  Tobacco Use   Smoking status: Never   Smokeless tobacco: Never  Vaping Use   Vaping Use: Never used  Substance and Sexual Activity   Alcohol use: Yes    Comment: Rarely   Drug use: Never   Sexual activity: Not on file  Other Topics Concern   Not on file  Social History Narrative   Regular exercise: yes (runs  and uses elliptical machine)Quit school in the 8th grade.   One story    Right Handed    Drinks caffeine   Social Determinants of Health   Financial Resource Strain: Not on file  Food Insecurity: Not on file  Transportation Needs: Not on file  Physical Activity: Not on file  Stress: Not on file  Social Connections: Not on file  Intimate Partner Violence: Not on file     Allergies  Allergen Reactions   Shellfish Allergy Nausea And Vomiting    shrimp   Penicillins Swelling and Rash    Has patient had a PCN reaction causing immediate rash, facial/tongue/throat swelling, SOB or lightheadedness with hypotension: Yes Has patient had a PCN reaction causing severe rash involving mucus membranes or skin necrosis: No Has patient had a PCN reaction that required hospitalization: No Has patient had a PCN reaction occurring within the last 10 years: No If all of the above answers are "NO", then may proceed with Cephalosporin use. REACTION: causes rash and sob.     Outpatient Medications Prior to  Visit  Medication Sig Dispense Refill   flecainide (TAMBOCOR) 50 MG tablet Take 1 tablet (50 mg total) by mouth 2 (two) times daily. 180 tablet 3   HYDROcodone bit-homatropine (HYCODAN) 5-1.5 MG/5ML syrup Take 5 mLs by mouth every 8 (eight) hours as needed for cough. 120 mL 0   losartan (COZAAR) 25 MG tablet Take 1 tablet (25 mg total) by mouth daily. 90 tablet 3   meloxicam (MOBIC) 15 MG tablet Take 1 tablet (15 mg total) by mouth daily as needed. 30 tablet 2   montelukast (SINGULAIR) 10 MG tablet Take 1 tablet (10 mg total) by mouth at bedtime. 30 tablet 3   nitroGLYCERIN (NITROSTAT) 0.4 MG SL tablet Place 1 tablet (0.4 mg total) under the tongue every 5 (five) minutes as needed for chest pain. 90 tablet 3   propranolol (INDERAL) 10 MG tablet Take 1 tablet (10 mg total) by mouth 3 (three) times daily. 180 tablet 3   tadalafil (CIALIS) 20 MG tablet Take 0.5-1 tablets (10-20 mg total) by mouth every other day as needed for erectile dysfunction. 30 tablet 2   tiZANidine (ZANAFLEX) 4 MG tablet Take 1 tablet (4 mg total) by mouth every 6 (six) hours as needed for muscle spasms. 30 tablet 0   venlafaxine XR (EFFEXOR XR) 37.5 MG 24 hr capsule Take 1 capsule (37.5 mg total) by mouth daily with breakfast. 90 capsule 2   Vitamin D, Ergocalciferol, (DRISDOL) 1.25 MG (50000 UNIT) CAPS capsule Take 1 capsule (50,000 Units total) by mouth every 7 (seven) days. 4 capsule 0   No facility-administered medications prior to visit.    Review of Systems  Constitutional:  Negative for chills, fever, malaise/fatigue and weight loss.  HENT:  Negative for congestion, nosebleeds, sinus pain and sore throat.   Eyes:  Negative for photophobia, pain and discharge.  Respiratory:  Positive for hemoptysis. Negative for cough, sputum production, shortness of breath and wheezing.   Cardiovascular:  Negative for chest pain, palpitations, orthopnea and leg swelling.  Gastrointestinal:  Negative for abdominal pain,  constipation, diarrhea, nausea and vomiting.  Genitourinary:  Negative for dysuria, frequency, hematuria and urgency.  Musculoskeletal:  Negative for back pain, joint pain, myalgias and neck pain.  Skin:  Negative for itching and rash.  Neurological:  Negative for tingling, tremors, sensory change, speech change, focal weakness, seizures, weakness and headaches.  Psychiatric/Behavioral:  Negative for memory loss, substance abuse and  suicidal ideas. The patient is not nervous/anxious.       Objective:  Physical Exam   Vitals:   12/22/21 0827  BP: 116/82  Pulse: (!) 50  Temp: 97.6 F (36.4 C)  TempSrc: Oral  SpO2: 100%  Weight: 290 lb 6.4 oz (131.7 kg)  Height: 5\' 9"  (1.753 m)    Gen: well appearing, no acute distress HENT: NCAT, OP clear, neck supple without masses Eyes: PERRL, EOMi Lymph: no cervical lymphadenopathy PULM: CTA B CV: RRR, no mgr, no JVD GI: BS+, soft, nontender, no hsm Derm: no rash or skin breakdown MSK: normal bulk and tone Neuro: A&Ox4, CN II-XII intact, strength 5/5 in all 4 extremities Psyche: normal mood and affect   CBC    Component Value Date/Time   WBC 6.4 03/16/2021 0935   RBC 4.69 03/16/2021 0935   HGB 13.6 03/16/2021 0935   HGB 13.2 02/02/2021 1235   HCT 41.2 03/16/2021 0935   HCT 39.4 02/02/2021 1235   PLT 257.0 03/16/2021 0935   PLT 270 02/02/2021 1235   MCV 88.0 03/16/2021 0935   MCV 87 02/02/2021 1235   MCH 29.1 02/02/2021 1235   MCH 30.9 07/21/2020 0654   MCHC 33.0 03/16/2021 0935   RDW 13.3 03/16/2021 0935   RDW 12.7 02/02/2021 1235   LYMPHSABS 2.5 02/02/2021 1235   MONOABS 0.4 07/21/2020 0654   EOSABS 0.4 02/02/2021 1235   BASOSABS 0.0 02/02/2021 1235     Chest imaging: October 2023 chest x-ray images independently reviewed showing normal cardiac silhouette, normal pulmonary parenchyma, findings of prior cervical spine fusion hardware  PFT:  Labs:  Path:  Echo:  Heart Catheterization:       Assessment &  Plan:   Hemoptysis - Plan: CT Chest Wo Contrast  Upper airway cough syndrome  Allergic rhinitis, unspecified seasonality, unspecified trigger  OSA on CPAP  Discussion: Mr. Devin [name] presents for cough after having an episode of pneumonia.  His chest x-ray cleared up over the course of a month with antibiotics but he ended up having some hemoptysis a couple weeks ago.  I explained to him that this is most likely due to persistent bronchitis in the setting of recent pneumonia, however it would be safest to go ahead and get a CT scan of his chest.  I believe that the cough is mostly due to upper airway irritation perpetuated by severe allergic rhinitis which is currently untreated.  We need to get his sinusitis under control so he can use CPAP again.  Currently he is unable due to persistent sinus congestion.   Plan: Upper airway cough syndrome: We will treat allergic rhinitis, see below You need to try to suppress your cough to allow your larynx (voice box) to heal.  For three days don't talk, laugh, sing, or clear your throat. Do everything you can to suppress the cough during this time. Use hard candies (sugarless Jolly Ranchers) or non-mint or non-menthol containing cough drops during this time to soothe your throat.  Use a cough suppressant (Delsym or what I have prescribed you) around the clock during this time.  After three days, gradually increase the use of your voice and back off on the cough suppressants.  Hemoptysis: (Coughing up blood) CT scan of the chest  Allergic rhinitis: Use Neil Med rinses with distilled water at least twice per day using the instructions on the package. 1/2 hour after using the [facility] Med rinse, use Nasacort two puffs in each nostril once per day.  Remember  that the Nasacort can take 1-2 weeks to work after regular use. Use generic zyrtec (cetirizine) every day.  If this doesn't help, then stop taking it and use chlorpheniramine-phenylephrine combination  tablets.  Obstructive sleep apnea, currently unable to use CPAP due to pneumonia and sinus congestion: Start taking the allergic rhinitis treatment as detailed above After 2 weeks start trying to use CPAP again.  Follow-up in 3 to 4 weeks with a nurse practitioner to see how you are doing from a sinusitis and sinus congestion standpoint.  May need to have CT scan of the sinuses if no improvement  Immunizations: Immunization History  Administered Date(s) Administered   Influenza,inj,Quad PF,6+ Mos 10/29/2013, 12/25/2014, 10/27/2015, 11/26/2019, 02/16/2021   Td 02/21/2008   Tdap 01/12/2020     Current Outpatient Medications:    flecainide (TAMBOCOR) 50 MG tablet, Take 1 tablet (50 mg total) by mouth 2 (two) times daily., Disp: 180 tablet, Rfl: 3   HYDROcodone bit-homatropine (HYCODAN) 5-1.5 MG/5ML syrup, Take 5 mLs by mouth every 8 (eight) hours as needed for cough., Disp: 120 mL, Rfl: 0   losartan (COZAAR) 25 MG tablet, Take 1 tablet (25 mg total) by mouth daily., Disp: 90 tablet, Rfl: 3   meloxicam (MOBIC) 15 MG tablet, Take 1 tablet (15 mg total) by mouth daily as needed., Disp: 30 tablet, Rfl: 2   montelukast (SINGULAIR) 10 MG tablet, Take 1 tablet (10 mg total) by mouth at bedtime., Disp: 30 tablet, Rfl: 3   nitroGLYCERIN (NITROSTAT) 0.4 MG SL tablet, Place 1 tablet (0.4 mg total) under the tongue every 5 (five) minutes as needed for chest pain., Disp: 90 tablet, Rfl: 3   propranolol (INDERAL) 10 MG tablet, Take 1 tablet (10 mg total) by mouth 3 (three) times daily., Disp: 180 tablet, Rfl: 3   tadalafil (CIALIS) 20 MG tablet, Take 0.5-1 tablets (10-20 mg total) by mouth every other day as needed for erectile dysfunction., Disp: 30 tablet, Rfl: 2   tiZANidine (ZANAFLEX) 4 MG tablet, Take 1 tablet (4 mg total) by mouth every 6 (six) hours as needed for muscle spasms., Disp: 30 tablet, Rfl: 0   venlafaxine XR (EFFEXOR XR) 37.5 MG 24 hr capsule, Take 1 capsule (37.5 mg total) by mouth daily  with breakfast., Disp: 90 capsule, Rfl: 2   Vitamin D, Ergocalciferol, (DRISDOL) 1.25 MG (50000 UNIT) CAPS capsule, Take 1 capsule (50,000 Units total) by mouth every 7 (seven) days., Disp: 4 capsule, Rfl: 0

## 2021-12-26 ENCOUNTER — Other Ambulatory Visit: Payer: Self-pay | Admitting: Cardiology

## 2021-12-30 ENCOUNTER — Ambulatory Visit (HOSPITAL_COMMUNITY): Payer: BC Managed Care – PPO

## 2022-01-06 ENCOUNTER — Other Ambulatory Visit: Payer: Self-pay | Admitting: Cardiology

## 2022-01-06 NOTE — Telephone Encounter (Signed)
Refill sent to pharmacy.   

## 2022-01-09 ENCOUNTER — Other Ambulatory Visit: Payer: Self-pay | Admitting: Cardiology

## 2022-01-09 NOTE — Telephone Encounter (Signed)
Refill to pharmacy 

## 2022-01-11 ENCOUNTER — Ambulatory Visit
Admission: RE | Admit: 2022-01-11 | Discharge: 2022-01-11 | Disposition: A | Discharge status: Home / Self Care | Payer: BC Managed Care – PPO | Source: Ambulatory Visit | Attending: Pulmonary Disease | Admitting: Pulmonary Disease

## 2022-01-11 DIAGNOSIS — R042 Hemoptysis: Secondary | ICD-10-CM | POA: Diagnosis not present

## 2022-01-11 DIAGNOSIS — Z8701 Personal history of pneumonia (recurrent): Secondary | ICD-10-CM | POA: Diagnosis not present

## 2022-01-18 ENCOUNTER — Encounter: Payer: Self-pay | Admitting: Nurse Practitioner

## 2022-01-18 ENCOUNTER — Other Ambulatory Visit: Payer: Self-pay | Admitting: Nurse Practitioner

## 2022-01-18 ENCOUNTER — Other Ambulatory Visit: Payer: BC Managed Care – PPO

## 2022-01-18 ENCOUNTER — Ambulatory Visit: Payer: BC Managed Care – PPO | Admitting: Nurse Practitioner

## 2022-01-18 VITALS — BP 128/70 | HR 55 | Ht 70.0 in | Wt 288.0 lb

## 2022-01-18 DIAGNOSIS — J209 Acute bronchitis, unspecified: Secondary | ICD-10-CM | POA: Diagnosis not present

## 2022-01-18 DIAGNOSIS — M940 Chondrocostal junction syndrome [Tietze]: Secondary | ICD-10-CM

## 2022-01-18 DIAGNOSIS — J329 Chronic sinusitis, unspecified: Secondary | ICD-10-CM | POA: Diagnosis not present

## 2022-01-18 MED ORDER — LIDOCAINE 5 % EX PTCH: 1.0000 | MEDICATED_PATCH | CUTANEOUS | 0 refills | Status: DC

## 2022-01-18 MED ORDER — PREDNISONE 20 MG PO TABS: 40.0000 mg | ORAL_TABLET | Freq: Every day | ORAL | 0 refills | Status: DC

## 2022-01-18 MED ORDER — PREDNISONE 20 MG PO TABS: 40.0000 mg | ORAL_TABLET | Freq: Every day | ORAL | 0 refills | Status: AC

## 2022-01-18 NOTE — Assessment & Plan Note (Signed)
Persistent symptoms. Mild improvement with intranasal steroid and daily antihistamine. Question allergy related vs structural from previous facial trauma. We will obtain allergy testing and CT of the sinuses. Will likely refer to ENT for further evaluation/management.   Patient Instructions  Continue saline rinses twice daily  Continue nasocort 2 sprays each nostril daily following the saline rinses  Continue zyrtec 1 tab daily   Prednisone 40 mg daily for 5 days. Take in AM with food Lidocaine patches - apply 1 patch every 24 hours to affected area, remove after 12 hours  Allergen panel today   CT sinus - someone will contact you for scheduling   Follow up in 2-3 weeks with Dr. Kendrick Fries or Florentina Addison Colleen Kotlarz,NP to discuss results and next steps. If symptoms do not improve or worsen, please contact office for sooner follow up or seek emergency care.

## 2022-01-18 NOTE — Progress Notes (Signed)
@Patient  ID: , male    DOB: [birth year], 51 y.o.   MRN: [id number]  Chief Complaint  Patient presents with   Follow-up    Still having rt rib pain from coughing, pt is feeling much better. Still can't breath out his nose, has not started back on cpap    Referring provider: [id number]*  HPI: 51 year old male, never smoker followed for upper airway cough and allergic rhinitis. He is a patient of Dr. 54 and last seen in office 12/22/2021 for initial pulmonary consult. Past medical history significant for HTN, PAF, OSA on CPAP, GERD, depression with anxiety, hx of heart murmur, obesity.   TEST/EVENTS:  01/11/2022 CT chest wo contrast: lungs are clear  12/22/2021: OV with Dr. 13/03/2021 for initial pulmonary consult.  At the beginning of September, he was sick with pneumonia and went to urgent care.  They sent him from urgent care to the ER.  He was treated with an oral antibiotic.  Cough persisted for several weeks afterwards.  Since then, has improved.  He did have a small amount of hemoptysis about 2 weeks prior to this visit.  No blood since then.  Does have OSA but has been struggling with chronic sinusitis and unable to breathe through his nose has not been wearing his CPAP.  Gained 90 pounds in the last 1.5 years.  Suspected that the hemoptysis was related to persistent bronchitis in the setting of recent pneumonia; however, advised that it would be safest to go ahead and get a CT scan of the chest.  Believe that cough is mostly due to upper airway irritation perpetuated by severe allergic rhinitis which is currently undertreated.  Advised him to start saline nasal rinses and intranasal steroid.  Recommended that he start Zyrtec daily.  If no benefit, can change to chlorpheniramine-phenylephrine combination tablets.  Encouraged him to try restarting CPAP once he has treated his rhinitis symptoms for 2 weeks.  01/18/2022: Today-follow-up Patient presents today  for follow-up.  His cough has mostly resolved.  He still having some right sided rib pain, that gets worse with lying on that side and driving.  No shortness of breath, no recurrent episodes of hemoptysis, no chest congestion.  He is still struggling with persistent nasal congestion and some postnasal drip.  He had a fall a few years ago and landed on his face.  Had traumatic injury to his nose.  He went to the ED and was treated with conservative treatment.  Never had any surgical intervention.  Does feel like he has an allergic component to his symptoms as well.  Feels like the Nasacort and Zyrtec have helped but not enough to where he feels like he can wear his CPAP at night.  He has never had a evaluation by ENT or imaging of his sinuses.  Allergies  Allergen Reactions   Shellfish Allergy Nausea And Vomiting    shrimp   Penicillins Swelling and Rash    Has patient had a PCN reaction causing immediate rash, facial/tongue/throat swelling, SOB or lightheadedness with hypotension: Yes Has patient had a PCN reaction causing severe rash involving mucus membranes or skin necrosis: No Has patient had a PCN reaction that required hospitalization: No Has patient had a PCN reaction occurring within the last 10 years: No If all of the above answers are "NO", then may proceed with Cephalosporin use. REACTION: causes rash and sob.    Immunization History  Administered Date(s) Administered   Influenza,inj,Quad PF,6+  Mos 10/29/2013, 12/25/2014, 10/27/2015, 11/26/2019, 02/16/2021   Td 02/21/2008   Tdap 01/12/2020    Past Medical History:  Diagnosis Date   Atrial fibrillation (HCC)    Constipation    GERD (gastroesophageal reflux disease)    History of MRSA infection    Hypertension    controlled   Keratoconus of both eyes    Tremor     Tobacco History: Social History   Tobacco Use  Smoking Status Never  Smokeless Tobacco Never   Counseling given: Not Answered   Outpatient Medications  Prior to Visit  Medication Sig Dispense Refill   flecainide (TAMBOCOR) 50 MG tablet TAKE ONE TABLET BY MOUTH TWICE DAILY (ROUND WHITE TABLET WITH YH 777) 180 tablet 1   losartan (COZAAR) 25 MG tablet TAKE ONE TABLET BY MOUTH EVERY DAY (OVAL WHITE TABLET WITH 5 I) 90 tablet 0   meloxicam (MOBIC) 15 MG tablet Take 1 tablet (15 mg total) by mouth daily as needed. 30 tablet 2   propranolol (INDERAL) 10 MG tablet TAKE ONE TABLET BY MOUTH THREE TIMES DAILY. (ROUND ORANGE TABLET) 270 tablet 0   venlafaxine XR (EFFEXOR XR) 37.5 MG 24 hr capsule Take 1 capsule (37.5 mg total) by mouth daily with breakfast. 90 capsule 2   nitroGLYCERIN (NITROSTAT) 0.4 MG SL tablet Place 1 tablet (0.4 mg total) under the tongue every 5 (five) minutes as needed for chest pain. 90 tablet 3   tadalafil (CIALIS) 20 MG tablet Take 0.5-1 tablets (10-20 mg total) by mouth every other day as needed for erectile dysfunction. (Patient not taking: Reported on 12/22/2021) 30 tablet 2   tiZANidine (ZANAFLEX) 4 MG tablet Take 1 tablet (4 mg total) by mouth every 6 (six) hours as needed for muscle spasms. 30 tablet 0   No facility-administered medications prior to visit.     Review of Systems:   Constitutional: No weight loss or gain, night sweats, fevers, chills, fatigue, or lassitude. HEENT: No headaches, difficulty swallowing, tooth/dental problems, or sore throat. No sneezing, itching, ear ache. +nasal congestion, nasal drainage CV:  No chest pain, orthopnea, PND, swelling in lower extremities, anasarca, dizziness, palpitations, syncope Resp: No shortness of breath with exertion or at rest. No excess mucus or change in color of mucus. No productive or non-productive. No hemoptysis. No wheezing.  No chest wall deformity GI:  No heartburn, indigestion, abdominal pain Skin: No rash, lesions, ulcerations MSK:  +right sided rib pain. No joint pain or swelling.  No decreased range of motion.  No back pain. Neuro: No dizziness or  lightheadedness.  Psych: No depression or anxiety. Mood stable.     Physical Exam:  BP 128/70 (BP Location: Right Arm, Patient Position: Sitting, Cuff Size: Normal)   Pulse (!) 55   Ht 5\' 10"  (1.778 m)   Wt 288 lb (130.6 kg)   SpO2 96%   BMI 41.32 kg/m   GEN: Pleasant, interactive, well-appearing; obese; in no acute distress. HEENT:  Normocephalic and atraumatic. PERRLA. Sclera white. Nasal turbinates erythematous, moist and patent bilaterally. Clear rhinorrhea present. Oropharynx pink and moist, without exudate or edema. No lesions, ulcerations NECK:  Supple w/ fair ROM. No JVD present. Normal carotid impulses w/o bruits. Thyroid symmetrical with no goiter or nodules palpated. No lymphadenopathy.   CV: RRR, no m/r/g, no peripheral edema. Pulses intact, +2 bilaterally. No cyanosis, pallor or clubbing. PULMONARY:  Unlabored, regular breathing. Clear bilaterally A&P w/o wheezes/rales/rhonchi. No accessory muscle use.  GI: BS present and normoactive. Soft, non-tender to palpation.  No organomegaly or masses detected.  MSK: Tenderness right lateral chest wall. No erythema, warmth. Cap refil <2 sec all extrem. No deformities or joint swelling noted.  Neuro: A/Ox3. No focal deficits noted.   Skin: Warm, no lesions or rashe Psych: Normal affect and behavior. Judgement and thought content appropriate.     Lab Results:  CBC    Component Value Date/Time   WBC 6.4 03/16/2021 0935   RBC 4.69 03/16/2021 0935   HGB 13.6 03/16/2021 0935   HGB 13.2 02/02/2021 1235   HCT 41.2 03/16/2021 0935   HCT 39.4 02/02/2021 1235   PLT 257.0 03/16/2021 0935   PLT 270 02/02/2021 1235   MCV 88.0 03/16/2021 0935   MCV 87 02/02/2021 1235   MCH 29.1 02/02/2021 1235   MCH 30.9 07/21/2020 0654   MCHC 33.0 03/16/2021 0935   RDW 13.3 03/16/2021 0935   RDW 12.7 02/02/2021 1235   LYMPHSABS 2.5 02/02/2021 1235   MONOABS 0.4 07/21/2020 0654   EOSABS 0.4 02/02/2021 1235   BASOSABS 0.0 02/02/2021 1235     BMET    Component Value Date/Time   NA 138 03/16/2021 0935   NA 138 02/02/2021 1235   K 4.4 03/16/2021 0935   CL 103 03/16/2021 0935   CO2 29 03/16/2021 0935   GLUCOSE 82 03/16/2021 0935   BUN 15 03/16/2021 0935   BUN 18 02/02/2021 1235   CREATININE 0.98 03/16/2021 0935   CALCIUM 9.4 03/16/2021 0935   GFRNONAA >60 07/21/2020 0654   GFRAA >60 07/30/2019 2159    BNP No results found for: "BNP"   Imaging:  CT Chest Wo Contrast  Result Date: 01/13/2022 CLINICAL DATA:  A 51 year old nonsmoker presents for evaluation of hemoptysis with history of pneumonia in September by report. EXAM: CT CHEST WITHOUT CONTRAST TECHNIQUE: Multidetector CT imaging of the chest was performed following the standard protocol without IV contrast. RADIATION DOSE REDUCTION: This exam was performed according to the departmental dose-optimization program which includes automated exposure control, adjustment of the mA and/or kV according to patient size and/or use of iterative reconstruction technique. COMPARISON:  None Available. FINDINGS: Cardiovascular: Noncontrast appearance of the heart and the great vessels in the chest is unremarkable. Mediastinum/Nodes: No thoracic inlet, axillary, mediastinal or hilar adenopathy. Esophagus grossly normal. Lungs/Pleura: No signs of consolidation or evidence of pleural effusion. Airways are patent. No suspicious pulmonary lesion. Upper Abdomen: Incidental imaging of upper abdominal contents without acute process to the extent evaluated. Musculoskeletal: No chest wall mass. No acute bone finding. No destructive bone process. Spinal degenerative changes. IMPRESSION: No acute cardiopulmonary process. Electronically Signed   By: Donzetta KohutGeoffrey  Wile M.D.   On: 01/13/2022 12:37          No data to display          No results found for: "NITRICOXIDE"      Assessment & Plan:   Chronic sinusitis Persistent symptoms. Mild improvement with intranasal steroid and daily  antihistamine. Question allergy related vs structural from previous facial trauma. We will obtain allergy testing and CT of the sinuses. Will likely refer to ENT for further evaluation/management.   Patient Instructions  Continue saline rinses twice daily  Continue nasocort 2 sprays each nostril daily following the saline rinses  Continue zyrtec 1 tab daily   Prednisone 40 mg daily for 5 days. Take in AM with food Lidocaine patches - apply 1 patch every 24 hours to affected area, remove after 12 hours  Allergen panel today   CT sinus -  someone will contact you for scheduling   Follow up in 2-3 weeks with Dr. Kendrick Fries or Florentina Addison Thos Matsumoto,NP to discuss results and next steps. If symptoms do not improve or worsen, please contact office for sooner follow up or seek emergency care.    Bronchitis, subacute Treated for pneumonia in September 2023. Developed a persistent cough with episode of hemoptysis in late October 2023. CT chest was clear. Symptoms have mostly resolved. Still with some pain to the right side; pleurisy vs costochondritis. Seems more MSK in nature given the worsening with movement and palpation.   Costochondritis Related to frequent coughing, which has resolved. We will treat him with prednisone burst and topical lidocaine patches.    I spent 35 minutes of dedicated to the care of this patient on the date of this encounter to include pre-visit review of records, face-to-face time with the patient discussing conditions above, post visit ordering of testing, clinical documentation with the electronic health record, making appropriate referrals as documented, and communicating necessary findings to members of the patients care team.  Noemi Chapel, NP 01/18/2022  Pt aware and understands NP's role.

## 2022-01-18 NOTE — Patient Instructions (Addendum)
Continue saline rinses twice daily  Continue nasocort 2 sprays each nostril daily following the saline rinses  Continue zyrtec 1 tab daily   Prednisone 40 mg daily for 5 days. Take in AM with food Lidocaine patches - apply 1 patch every 24 hours to affected area, remove after 12 hours  Allergen panel today   CT sinus - someone will contact you for scheduling   Follow up in 2-3 weeks with Dr. Kendrick Fries or Florentina Addison Delaynie Stetzer,NP to discuss results and next steps. If symptoms do not improve or worsen, please contact office for sooner follow up or seek emergency care.

## 2022-01-18 NOTE — Assessment & Plan Note (Signed)
Treated for pneumonia in September 2023. Developed a persistent cough with episode of hemoptysis in late October 2023. CT chest was clear. Symptoms have mostly resolved. Still with some pain to the right side; pleurisy vs costochondritis. Seems more MSK in nature given the worsening with movement and palpation.

## 2022-01-18 NOTE — Assessment & Plan Note (Signed)
Related to frequent coughing, which has resolved. We will treat him with prednisone burst and topical lidocaine patches.

## 2022-01-19 ENCOUNTER — Other Ambulatory Visit (HOSPITAL_COMMUNITY): Payer: Self-pay

## 2022-01-19 NOTE — Progress Notes (Signed)
Reviewed, agree 

## 2022-01-20 ENCOUNTER — Other Ambulatory Visit: Payer: BC Managed Care – PPO

## 2022-01-20 ENCOUNTER — Inpatient Hospital Stay: Admission: RE | Admit: 2022-01-20 | Payer: BC Managed Care – PPO | Source: Ambulatory Visit

## 2022-01-25 ENCOUNTER — Encounter: Payer: Self-pay | Admitting: Family Medicine

## 2022-01-25 LAB — ALLERGENS(5)
Allergen Flounder IgE: <0.1 kU/L
F042-IgE Haddock: <0.1 kU/L
F384-IgE Whitefish: 0.1 kU/L — AB
Shrimp IgE: 0.4 kU/L — AB
Tuna: <0.1 kU/L

## 2022-01-26 NOTE — Progress Notes (Signed)
Please notify patient that the lab sent the wrong allergen panel. Not sure how this happened. However, he is allergic to shrimp, which looks like he was already aware of. He had a very low positive to [employer] as well.  Please find out what we need to do to ensure he isn't charged for this. The allergen panel I ordered was for environmental allergies. It is still listed as active.

## 2022-01-30 ENCOUNTER — Other Ambulatory Visit: Payer: Self-pay | Admitting: Family Medicine

## 2022-01-31 NOTE — Progress Notes (Signed)
Per the lab technician Darcel Bayley), the order did not come across and therefore was not drawn.  States the order was not put in correctly.  ATC patient, LVM to return call.  When he returns call, he will need to have the lab work drawn again for the correct allergy panel.

## 2022-02-03 ENCOUNTER — Telehealth: Payer: Self-pay | Admitting: *Deleted

## 2022-02-03 NOTE — Telephone Encounter (Signed)
Called and spoke with patient, he will come by the office on Monday 12/18 to have his allergy panel drawn.  Nothing further needed.

## 2022-02-03 NOTE — Addendum Note (Signed)
Addended by: Delrae Rend on: 02/03/2022 03:45 PM   Modules accepted: Orders

## 2022-02-03 NOTE — Progress Notes (Signed)
Called and spoke with patient, advised of results/recommendations per Florentina Addison.  He verbalized understanding.  He will come in on 12/18 to have his allergy panel drawn and I scheduled his f/u OV for 02/23/2021.  Nothing further needed.

## 2022-02-14 ENCOUNTER — Other Ambulatory Visit (HOSPITAL_COMMUNITY): Payer: Self-pay

## 2022-02-14 ENCOUNTER — Telehealth: Payer: Self-pay

## 2022-02-14 NOTE — Telephone Encounter (Signed)
PA has been DENIED for Lidocaine 5% patches.

## 2022-02-14 NOTE — Telephone Encounter (Signed)
PA submitted for Lidocaine 5% patches through [employer].  PA is pending determination.  Previous note on 12/06 suggests OTC Lidocaine 4% patches from provider which is why the 5% PA was not started.  5% patches are typically not covered for "rib pain due to coughing" but we will see what happens.  Key: [id number]

## 2022-02-15 ENCOUNTER — Other Ambulatory Visit: Payer: BC Managed Care – PPO

## 2022-02-15 DIAGNOSIS — J329 Chronic sinusitis, unspecified: Secondary | ICD-10-CM

## 2022-02-16 ENCOUNTER — Other Ambulatory Visit: Payer: BC Managed Care – PPO

## 2022-02-17 ENCOUNTER — Encounter: Payer: BC Managed Care – PPO | Admitting: Family Medicine

## 2022-02-17 LAB — ALLERGEN PANEL (27) + IGE
Alternaria Alternata IgE: 1.69 kU/L — AB
Aspergillus Fumigatus IgE: 1.59 kU/L — AB
Bahia Grass IgE: 0.61 kU/L — AB
Bermuda Grass IgE: 0.25 kU/L — AB
Cat Dander IgE: <0.1 kU/L
Cedar, Mountain IgE: 0.2 kU/L — AB
Cladosporium Herbarum IgE: 2.31 kU/L — AB
Cocklebur IgE: 0.21 kU/L — AB
Cockroach, American IgE: <0.1 kU/L
Common Silver Birch IgE: <0.1 kU/L
D Farinae IgE: 0.66 kU/L — AB
D Pteronyssinus IgE: 1.03 kU/L — AB
Dog Dander IgE: 0.26 kU/L — AB
Elm, American IgE: 0.35 kU/L — AB
Hickory, White IgE: 0.27 kU/L — AB
IgE (Immunoglobulin E), Serum: 2468 IU/mL — ABNORMAL HIGH (ref 6–495)
Johnson Grass IgE: 0.27 kU/L — AB
Kentucky Bluegrass IgE: 0.6 kU/L — AB
Maple/Box Elder IgE: 0.32 kU/L — AB
Mucor Racemosus IgE: 5.16 kU/L — AB
Oak, White IgE: 0.13 kU/L — AB
Penicillium Chrysogen IgE: 1.5 kU/L — AB
Pigweed, Rough IgE: 0.15 kU/L — AB
Plantain, English IgE: 0.2 kU/L — AB
Ragweed, Short IgE: 0.24 kU/L — AB
Setomelanomma Rostrat: 2.34 kU/L — AB
Timothy Grass IgE: 1.17 kU/L — AB
White Mulberry IgE: 0.11 kU/L — AB

## 2022-02-17 NOTE — Progress Notes (Signed)
IgE significantly elevated and multiple environmental allergies. Recommend referral to an allergist. Please place order if pt agreeable to this. Thanks.

## 2022-02-23 ENCOUNTER — Ambulatory Visit: Payer: BC Managed Care – PPO | Admitting: Nurse Practitioner

## 2022-03-03 DIAGNOSIS — J309 Allergic rhinitis, unspecified: Secondary | ICD-10-CM

## 2022-03-03 NOTE — Telephone Encounter (Signed)
Referral was supposed to be put in per note from 1/3 but I don't see it. I have ordered it today. He should hear from them next week about scheduling an appt. Thanks.

## 2022-03-23 ENCOUNTER — Ambulatory Visit: Payer: BC Managed Care – PPO | Admitting: Allergy and Immunology

## 2022-03-23 ENCOUNTER — Encounter: Payer: Self-pay | Admitting: Allergy and Immunology

## 2022-03-23 VITALS — BP 124/82 | HR 86 | Resp 16 | Ht 70.0 in | Wt 293.2 lb

## 2022-03-23 DIAGNOSIS — R430 Anosmia: Secondary | ICD-10-CM

## 2022-03-23 DIAGNOSIS — J3489 Other specified disorders of nose and nasal sinuses: Secondary | ICD-10-CM

## 2022-03-23 DIAGNOSIS — S022XXD Fracture of nasal bones, subsequent encounter for fracture with routine healing: Secondary | ICD-10-CM

## 2022-03-23 DIAGNOSIS — T7800XA Anaphylactic reaction due to unspecified food, initial encounter: Secondary | ICD-10-CM

## 2022-03-23 DIAGNOSIS — I480 Paroxysmal atrial fibrillation: Secondary | ICD-10-CM

## 2022-03-23 DIAGNOSIS — J3089 Other allergic rhinitis: Secondary | ICD-10-CM | POA: Diagnosis not present

## 2022-03-23 DIAGNOSIS — G4733 Obstructive sleep apnea (adult) (pediatric): Secondary | ICD-10-CM

## 2022-03-23 DIAGNOSIS — J301 Allergic rhinitis due to pollen: Secondary | ICD-10-CM

## 2022-03-23 MED ORDER — EPINEPHRINE 0.3 MG/0.3ML IJ SOAJ: 0.3000 mg | INTRAMUSCULAR | 1 refills | Status: AC | PRN

## 2022-03-23 MED ORDER — MONTELUKAST SODIUM 10 MG PO TABS: 10.0000 mg | ORAL_TABLET | Freq: Every day | ORAL | 5 refills | Status: DC

## 2022-03-23 MED ORDER — RYALTRIS 665-25 MCG/ACT NA SUSP: NASAL | 5 refills | Status: DC

## 2022-03-23 NOTE — Patient Instructions (Addendum)
  1. Allergen avoidance measures - shellfish, dust mite, pollen, mold, dog  2. Minimize caffeine use. No oral decongestants  3. Every night use the following:   A. OTC Afrin - 1 spray single nostril. Alternate nostril nightly  B. Ryaltris - 2 sprays each nostril  C. Montelukast 10 mg - 1 tablet  4. Every morning use the following:   A. Ryaltris - 2 sprays each nostril  B. Loratadine 10 mg - 1 tablet  5. If needed: Epi-Pen. Benadryl, MD/ER evaluation for allergic reaction.  6. Obtain sinus CT scan for fractured nose / anosmia  7. Retry CPAP one week after starting treatment  8. Immunotherapy???  9. Return to clinic in 4 weeks or earlier if needed.

## 2022-03-23 NOTE — Progress Notes (Signed)
[facility] [place]   Dear Marland Kitchen,  Thank you for referring Devin [name] to the [facility] on 03/23/2022.   Below is a summation of this patient's evaluation and recommendations.  Thank you for your referral. I will keep you informed about this patient's response to treatment.   If you have any questions please do not hesitate to contact me.   Sincerely,  Jiles Prows, MD Allergy / Immunology [place]   ______________________________________________________________________    NEW PATIENT NOTE  Referring Provider: Clayton Bibles, NP Primary Provider: [name], DO Date of office visit: 03/23/2022    Subjective:   Chief Complaint:  Devin [name] (DOB: Dec 22, 1970) is a 52 y.o. male who presents to the clinic on 03/23/2022 with a chief complaint of Sinus Problem .     HPI: Devin [name] presents to this clinic in evaluation of persistent respiratory tract symptoms.  His history dates back over 10 years ago when he fell on his face while intoxicated and fractured his nose.  He went to the emergency room for evaluation but no imaging was performed.  Ever since then he has had persistent nasal congestion and he cannot smell.  He has some sneezing on occasion.  He cannot really identify any trigger that gives rise to the symptoms.  He has tried various medications which have not helped this issue.  It sounds as though he has used a nasal steroid.  He was diagnosed with sleep apnea September 2023 during an evaluation for what sounds like pneumonia with prolonged cough for which he saw [facility] pulmonology.  Fortunately, the cough has resolved and he does not require any therapy for that issue at this point in time.  Unfortunately, he cannot use his CPAP machine because of his nasal issue as he developed severe nasal congestion at  nighttime when using this device.  About 30 years ago he ate shrimp and developed facial swelling and vomiting.  He has been shellfish free since that point in time.  He has intermittent atrial fibrillation and is on flecainide and a beta-blocker.  He is not on any blood thinners at this point.  These episodes appear to occur about 1 time per year.  He drinks about 24 ounces of coffee at this point time and intermittently uses Sudafed.  Past Medical History:  Diagnosis Date   Atrial fibrillation (HCC)    Constipation    GERD (gastroesophageal reflux disease)    History of MRSA infection    Hypertension    controlled   Keratoconus of both eyes    Tremor     Past Surgical History:  Procedure Laterality Date   NECK SURGERY  02/03/14    Allergies as of 03/23/2022       Reactions   Shellfish Allergy Nausea And Vomiting   shrimp   Penicillins Swelling, Rash   Has patient had a PCN reaction causing immediate rash, facial/tongue/throat swelling, SOB or lightheadedness with hypotension: Yes Has patient had a PCN reaction causing severe rash involving mucus membranes or skin necrosis: No Has patient had a PCN reaction that required hospitalization: No Has patient had a PCN reaction occurring within the last 10 years: No If all of the above answers are "NO", then may proceed with Cephalosporin use. REACTION: causes rash and sob.        Medication List  flecainide 50 MG tablet Commonly known as: TAMBOCOR TAKE ONE TABLET BY MOUTH TWICE DAILY (ROUND WHITE TABLET WITH YH 777)   lidocaine 5 % Commonly known as: Lidoderm Place 1 patch onto the skin daily. Remove and Discard patch within 12 hours or as directed by MD   losartan 25 MG tablet Commonly known as: COZAAR TAKE ONE TABLET BY MOUTH EVERY DAY (OVAL WHITE TABLET WITH 5 I)   meloxicam 15 MG tablet Commonly known as: MOBIC Take 1 tablet (15 mg total) by mouth daily as needed. (OBLONG YELLOW TABLET WITH UL 15)    nitroGLYCERIN 0.4 MG SL tablet Commonly known as: NITROSTAT Place 1 tablet (0.4 mg total) under the tongue every 5 (five) minutes as needed for chest pain.   propranolol 10 MG tablet Commonly known as: INDERAL TAKE ONE TABLET BY MOUTH THREE TIMES DAILY. (ROUND ORANGE TABLET)   tadalafil 20 MG tablet Commonly known as: CIALIS Take 0.5-1 tablets (10-20 mg total) by mouth every other day as needed for erectile dysfunction.   tiZANidine 4 MG tablet Commonly known as: Zanaflex Take 1 tablet (4 mg total) by mouth every 6 (six) hours as needed for muscle spasms.   venlafaxine XR 37.5 MG 24 hr capsule Commonly known as: Effexor XR Take 1 capsule (37.5 mg total) by mouth daily with breakfast.    Review of systems negative except as noted in HPI / PMHx or noted below:  Review of Systems  Constitutional: Negative.   HENT: Negative.    Eyes: Negative.   Respiratory: Negative.    Cardiovascular: Negative.   Gastrointestinal: Negative.   Genitourinary: Negative.   Musculoskeletal: Negative.   Skin: Negative.   Neurological: Negative.   Endo/Heme/Allergies: Negative.   Psychiatric/Behavioral: Negative.      Family History  Problem Relation Age of Onset   Diabetes Mother    Hyperlipidemia Mother    Heart disease Mother        CHF   Thyroid disease Mother        Graves Disease   Cancer Mother    Alcoholism Mother    Hypertension Father    Alcoholism Father    Drug abuse Father    Obesity Father    Brain cancer Brother        GBM   Asthma Daughter    ADD / ADHD Daughter     Social History   Socioeconomic History   Marital status: Married    Spouse name: Daisuke Bailey   Number of children: 3   Years of education: Not on file   Highest education level: Not on file  Occupational History   Occupation: Information systems manager: [employer]  Tobacco Use   Smoking status: Never   Smokeless tobacco: Never  Vaping Use   Vaping Use: Never used  Substance and Sexual  Activity   Alcohol use: Yes    Comment: Rarely   Drug use: Never   Sexual activity: Not on file  Other Topics Concern   Not on file  Social History Narrative   Regular exercise: yes (runs and uses elliptical machine)Quit school in the 8th grade.   One story    Right Handed    Drinks caffeine   Environmental and Social history  Lives in a house with a dry environment, birds and dogs located inside the household, no carpet in the bedroom, no plastic on the bed, no plastic on the pillow, and no smoking ongoing inside the household.  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  Objective:   Vitals:   03/23/22 0927  BP: 124/82  Pulse: 86  Resp: 16  SpO2: 98%   Height: 5\' 10"  (177.8 cm) Weight: 293 lb 3.2 oz (133 kg)  Physical Exam Constitutional:      Appearance: He is not diaphoretic.  HENT:     Head: Normocephalic.     Right Ear: Tympanic membrane, ear canal and external ear normal.     Left Ear: Tympanic membrane, ear canal and external ear normal.     Nose: Mucosal edema present. No rhinorrhea.     Mouth/Throat:     Pharynx: Uvula midline. No oropharyngeal exudate.  Eyes:     Conjunctiva/sclera: Conjunctivae normal.  Neck:     Thyroid: No thyromegaly.     Trachea: Trachea normal. No tracheal tenderness or tracheal deviation.  Cardiovascular:     Rate and Rhythm: Normal rate and regular rhythm.     Heart sounds: Normal heart sounds, S1 normal and S2 normal. No murmur heard. Pulmonary:     Effort: No respiratory distress.     Breath sounds: Normal breath sounds. No stridor. No wheezing or rales.  Lymphadenopathy:     Head:     Right side of head: No tonsillar adenopathy.     Left side of head: No tonsillar adenopathy.     Cervical: No cervical adenopathy.  Skin:    Findings: No erythema or rash.     Nails: There is no clubbing.  Neurological:     Mental Status: He is alert.     Diagnostics: Allergy skin tests were performed.  He demonstrated  hypersensitivity against dust mite, [place] grass, mold.  Results of blood tests obtained 02 February 2021 identifies WBC 7.1, absolute eosinophil 400, absolute lymphocyte 2500, hemoglobin 13.2, platelet 270.  Results of blood tests obtained 18 January 2022 identifies shrimp IgE 0.40 KU/L, IgE antibodies directed against multiple aeroallergens including dust mite, grass pollen, tree pollen, weed pollen, molds, dog.  Total IgE was 2468 KU/L  Assessment and Plan:    1. Perennial allergic rhinitis   2. Seasonal allergic rhinitis due to pollen   3. Allergy with anaphylaxis due to food   4. Closed fracture of nasal bone with routine healing, subsequent encounter   5. Anosmia due to nasal mucosa problem   6. Obstructive sleep apnea syndrome   7. Paroxysmal atrial fibrillation (HCC)    1. Allergen avoidance measures - shellfish, dust mite, pollen, mold, dog  2. Minimize caffeine use. No oral decongestants  3. Every night use the following:   A. OTC Afrin - 1 spray single nostril. Alternate nostril nightly  B. Ryaltris - 2 sprays each nostril  C. Montelukast 10 mg - 1 tablet  4. Every morning use the following:   A. Ryaltris - 2 sprays each nostril  B. Loratadine 10 mg - 1 tablet  5. If needed: Epi-Pen. Benadryl, MD/ER evaluation for allergic reaction.  6. Obtain sinus CT scan for fractured nose / anosmia  7. Retry CPAP one week after starting treatment  8. Immunotherapy???  9. Return to clinic in 4 weeks or earlier if needed.  Devin [name] has a atopic immune system with significant inflammation of his airway secondary to this condition and we will get him to avoid aeroallergens as best as possible and I have given him a collection of agents to utilize including the use of a combination nasal decongestant, nasal antihistamine, nasal steroid to help with his persistent nasal  congestion that prevents him from using his CPAP machine.  We also need to image his airway given the fact that he  has fractured his nose and has anosmia.  As well, he will obviously avoid shellfish and given his history of intermittent atrial fibrillation I had a talk with him today about minimizing his caffeine use.  I will see him back in this clinic in 4 weeks.  Jessica Priest, MD Allergy / Immunology [facility]

## 2022-03-30 ENCOUNTER — Encounter (HOSPITAL_COMMUNITY): Payer: Self-pay | Admitting: *Deleted

## 2022-04-05 ENCOUNTER — Telehealth: Payer: Self-pay

## 2022-04-05 NOTE — Telephone Encounter (Signed)
Sinus CT has been scheduled at:  [facility]  Appointment Date: 04/11/2022 Appointment Time: 8:30   Patient's wife has been informed.    Authorization # [id number]  Expires on: 05/02/2022

## 2022-04-07 ENCOUNTER — Other Ambulatory Visit: Payer: Self-pay | Admitting: Cardiology

## 2022-04-11 DIAGNOSIS — R430 Anosmia: Secondary | ICD-10-CM | POA: Diagnosis not present

## 2022-04-11 DIAGNOSIS — J341 Cyst and mucocele of nose and nasal sinus: Secondary | ICD-10-CM | POA: Diagnosis not present

## 2022-04-11 DIAGNOSIS — S022XXA Fracture of nasal bones, initial encounter for closed fracture: Secondary | ICD-10-CM | POA: Diagnosis not present

## 2022-04-11 DIAGNOSIS — J3489 Other specified disorders of nose and nasal sinuses: Secondary | ICD-10-CM | POA: Diagnosis not present

## 2022-04-11 DIAGNOSIS — J342 Deviated nasal septum: Secondary | ICD-10-CM | POA: Diagnosis not present

## 2022-04-11 DIAGNOSIS — Z8781 Personal history of (healed) traumatic fracture: Secondary | ICD-10-CM | POA: Diagnosis not present

## 2022-04-17 ENCOUNTER — Ambulatory Visit: Payer: BC Managed Care – PPO | Admitting: Allergy and Immunology

## 2022-04-17 ENCOUNTER — Encounter: Payer: Self-pay | Admitting: Allergy and Immunology

## 2022-04-17 VITALS — BP 138/88 | HR 56 | Resp 16

## 2022-04-17 DIAGNOSIS — R430 Anosmia: Secondary | ICD-10-CM | POA: Diagnosis not present

## 2022-04-17 DIAGNOSIS — J342 Deviated nasal septum: Secondary | ICD-10-CM | POA: Diagnosis not present

## 2022-04-17 DIAGNOSIS — J301 Allergic rhinitis due to pollen: Secondary | ICD-10-CM

## 2022-04-17 DIAGNOSIS — T7800XD Anaphylactic reaction due to unspecified food, subsequent encounter: Secondary | ICD-10-CM

## 2022-04-17 DIAGNOSIS — J3089 Other allergic rhinitis: Secondary | ICD-10-CM

## 2022-04-17 DIAGNOSIS — T7800XA Anaphylactic reaction due to unspecified food, initial encounter: Secondary | ICD-10-CM

## 2022-04-17 DIAGNOSIS — G4733 Obstructive sleep apnea (adult) (pediatric): Secondary | ICD-10-CM

## 2022-04-17 DIAGNOSIS — J3489 Other specified disorders of nose and nasal sinuses: Secondary | ICD-10-CM

## 2022-04-17 NOTE — Progress Notes (Unsigned)
[place]   Follow-up Note  Referring Provider: [name]* Primary Provider: [name], DO Date of Office Visit: 04/17/2022  Subjective:   Devin [name] (DOB: November 01, 1970) is a 52 y.o. male who returns to the [facility] on 04/17/2022 in re-evaluation of the following:  HPI: Devin [name] returns to this clinic in reevaluation of allergic rhinoconjunctivitis, deviated septum, anosmia, sleep apnea, history of shellfish allergy.  I last saw him in this clinic during his initial evaluation of 23 March 2022.  He is better regarding the congestion of his nose while performing allergen avoidance measures against dust mite and consistently using the plan of a nasal decongestant, nasal antihistamine, nasal antihistamine, and a leukotriene modifier.  But, he is not good enough that he can use his CPAP machine as he still gets extremely congested when using this device.  He had a history of atrial fibrillation and he has discontinued caffeine use and does not use any oral decongestants at this point in time.  Allergies as of 04/17/2022       Reactions   Shellfish Allergy Nausea And Vomiting   shrimp   Penicillins Swelling, Rash   Has patient had a PCN reaction causing immediate rash, facial/tongue/throat swelling, SOB or lightheadedness with hypotension: Yes Has patient had a PCN reaction causing severe rash involving mucus membranes or skin necrosis: No Has patient had a PCN reaction that required hospitalization: No Has patient had a PCN reaction occurring within the last 10 years: No If all of the above answers are "NO", then may proceed with Cephalosporin use. REACTION: causes rash and sob.        Medication List    EPINEPHrine 0.3 mg/0.3 mL Soaj injection Commonly known as: EPI-PEN Inject 0.3 mg into the muscle as needed for anaphylaxis. As needed for life-threatening allergic reactions    flecainide 50 MG tablet Commonly known as: TAMBOCOR TAKE ONE TABLET BY MOUTH TWICE DAILY (ROUND WHITE TABLET WITH YH 777)   lidocaine 5 % Commonly known as: Lidoderm Place 1 patch onto the skin daily. Remove and Discard patch within 12 hours or as directed by MD   losartan 25 MG tablet Commonly known as: COZAAR TAKE ONE TABLET BY MOUTH EVERY DAY (OVAL WHITE TABLET WITH 5 I)   meloxicam 15 MG tablet Commonly known as: MOBIC Take 1 tablet (15 mg total) by mouth daily as needed. (OBLONG YELLOW TABLET WITH UL 15)   montelukast 10 MG tablet Commonly known as: SINGULAIR Take 1 tablet (10 mg total) by mouth at bedtime.   nitroGLYCERIN 0.4 MG SL tablet Commonly known as: NITROSTAT Place 1 tablet (0.4 mg total) under the tongue every 5 (five) minutes as needed for chest pain.   propranolol 10 MG tablet Commonly known as: INDERAL TAKE ONE TABLET BY MOUTH THREE TIMES DAILY. (ROUND ORANGE TABLET)   Ryaltris 665-25 MCG/ACT Susp Generic drug: Olopatadine-Mometasone 2 sprays each nostril daily   venlafaxine XR 37.5 MG 24 hr capsule Commonly known as: Effexor XR Take 1 capsule (37.5 mg total) by mouth daily with breakfast.    Past Medical History:  Diagnosis Date   Allergic rhinitis    Atrial fibrillation (HCC)    Constipation    GERD (gastroesophageal reflux disease)    History of MRSA infection    Hypertension    controlled   Keratoconus of both eyes    Tremor     Past Surgical History:  Procedure  Laterality Date   NECK SURGERY  02/03/14    Review of systems negative except as noted in HPI / PMHx or noted below:  Review of Systems  Constitutional: Negative.   HENT: Negative.    Eyes: Negative.   Respiratory: Negative.    Cardiovascular: Negative.   Gastrointestinal: Negative.   Genitourinary: Negative.   Musculoskeletal: Negative.   Skin: Negative.   Neurological: Negative.   Endo/Heme/Allergies: Negative.   Psychiatric/Behavioral: Negative.        Objective:   Vitals:   04/17/22 0824  BP: 138/88  Pulse: (!) 56  Resp: 16  SpO2: 99%          Physical Exam Constitutional:      Appearance: He is not diaphoretic.  HENT:     Head: Normocephalic.     Right Ear: Tympanic membrane, ear canal and external ear normal.     Left Ear: Tympanic membrane, ear canal and external ear normal.     Nose: Nose normal. No mucosal edema or rhinorrhea.     Mouth/Throat:     Pharynx: Uvula midline. No oropharyngeal exudate.  Eyes:     Conjunctiva/sclera: Conjunctivae normal.  Neck:     Thyroid: No thyromegaly.     Trachea: Trachea normal. No tracheal tenderness or tracheal deviation.  Cardiovascular:     Rate and Rhythm: Normal rate and regular rhythm.     Heart sounds: Normal heart sounds, S1 normal and S2 normal. No murmur heard. Pulmonary:     Effort: No respiratory distress.     Breath sounds: Normal breath sounds. No stridor. No wheezing or rales.  Lymphadenopathy:     Head:     Right side of head: No tonsillar adenopathy.     Left side of head: No tonsillar adenopathy.     Cervical: No cervical adenopathy.  Skin:    Findings: No erythema or rash.     Nails: There is no clubbing.  Neurological:     Mental Status: He is alert.     Diagnostics: Results of a sinus CT scan obtained 10 April 2022 identified as rightward septal deviation, septal spurring, generalize nasal cavity mucosal thickening, bilateral maxillary sinus mucosal thickening on the left resembling 1 or more mucous retention cysts.  Similar small retention cyst suspected at the right sphenoid sinus.  Hypoplastic frontal sinus.  Assessment and Plan:   1. Perennial allergic rhinitis   2. Seasonal allergic rhinitis due to pollen   3. Deviated septum   4. Anosmia due to nasal mucosa problem   5. Obstructive sleep apnea syndrome   6. Allergy with anaphylaxis due to food     1. Allergen avoidance measures - shellfish, dust mite, pollen, mold, dog  2.  Every night use the following:   A. OTC Afrin - 1 spray single nostril. Alternate nostril nightly  B. Ryaltris - 2 sprays each nostril  C. Montelukast 10 mg - 1 tablet  4. Every morning use the following:   A. Ryaltris - 2 sprays each nostril  B. Loratadine 10 mg - 1 tablet  5. If needed: Epi-Pen. Benadryl, MD/ER evaluation for allergic reaction.  6. Start a course of immunotherapy  7. Visit with ENT about repair of deviated septum  8. Return to clinic in summer 2024 or earlier if needed.  [place] will now visit with a ear nose and throat doctor to see about possible surgical approach to his anatomical abnormality involving his upper airways while he continues to address his atopic disease  with the plan noted above.  I did make the recommendation that he start a course of immunotherapy.  Of course he is not going to eat shellfish.  Allena Katz, MD Allergy / Immunology [place]

## 2022-04-17 NOTE — Patient Instructions (Addendum)
  1. Allergen avoidance measures - shellfish, dust mite, pollen, mold, dog  2. Every night use the following:   A. OTC Afrin - 1 spray single nostril. Alternate nostril nightly  B. Ryaltris - 2 sprays each nostril  C. Montelukast 10 mg - 1 tablet  4. Every morning use the following:   A. Ryaltris - 2 sprays each nostril  B. Loratadine 10 mg - 1 tablet  5. If needed: Epi-Pen. Benadryl, MD/ER evaluation for allergic reaction.  6. Start a course of immunotherapy  7. Visit with ENT about repair of deviated septum  8. Return to clinic in summer 2024 or earlier if needed.

## 2022-04-18 ENCOUNTER — Encounter: Payer: Self-pay | Admitting: Allergy and Immunology

## 2022-04-19 ENCOUNTER — Telehealth: Payer: Self-pay

## 2022-04-19 NOTE — Telephone Encounter (Signed)
Faxed referral to [facility] in [place] for diagnosis of deviated septum.

## 2022-04-24 ENCOUNTER — Other Ambulatory Visit: Payer: Self-pay | Admitting: Family Medicine

## 2022-04-24 DIAGNOSIS — H18621 Keratoconus, unstable, right eye: Secondary | ICD-10-CM | POA: Diagnosis not present

## 2022-04-25 ENCOUNTER — Other Ambulatory Visit (HOSPITAL_BASED_OUTPATIENT_CLINIC_OR_DEPARTMENT_OTHER): Payer: Self-pay

## 2022-04-25 ENCOUNTER — Ambulatory Visit (INDEPENDENT_AMBULATORY_CARE_PROVIDER_SITE_OTHER): Payer: BC Managed Care – PPO | Admitting: Family Medicine

## 2022-04-25 ENCOUNTER — Encounter: Payer: Self-pay | Admitting: Family Medicine

## 2022-04-25 ENCOUNTER — Other Ambulatory Visit: Payer: Self-pay | Admitting: Family Medicine

## 2022-04-25 ENCOUNTER — Telehealth: Payer: Self-pay

## 2022-04-25 ENCOUNTER — Other Ambulatory Visit: Payer: BC Managed Care – PPO

## 2022-04-25 VITALS — BP 123/81 | HR 55 | Temp 97.9°F | Ht 70.0 in | Wt 291.1 lb

## 2022-04-25 DIAGNOSIS — Z1211 Encounter for screening for malignant neoplasm of colon: Secondary | ICD-10-CM

## 2022-04-25 DIAGNOSIS — Z0000 Encounter for general adult medical examination without abnormal findings: Secondary | ICD-10-CM | POA: Diagnosis not present

## 2022-04-25 DIAGNOSIS — D72818 Other decreased white blood cell count: Secondary | ICD-10-CM | POA: Diagnosis not present

## 2022-04-25 LAB — COMPREHENSIVE METABOLIC PANEL
ALT: 25 U/L (ref 0–53)
AST: 15 U/L (ref 0–37)
Albumin: 3.9 g/dL (ref 3.5–5.2)
Alkaline Phosphatase: 80 U/L (ref 39–117)
BUN: 17 mg/dL (ref 6–23)
CO2: 28 mEq/L (ref 19–32)
Calcium: 9.4 mg/dL (ref 8.4–10.5)
Chloride: 103 mEq/L (ref 96–112)
Creatinine, Ser: 0.9 mg/dL (ref 0.40–1.50)
GFR: 100.44 mL/min (ref >60.00)
Glucose, Bld: 87 mg/dL (ref 70–99)
Potassium: 4.4 mEq/L (ref 3.5–5.1)
Sodium: 139 mEq/L (ref 135–145)
Total Bilirubin: 0.5 mg/dL (ref 0.2–1.2)
Total Protein: 6.7 g/dL (ref 6.0–8.3)

## 2022-04-25 LAB — LIPID PANEL
Cholesterol: 188 mg/dL (ref 0–200)
HDL: 53.1 mg/dL (ref >39.00)
LDL Cholesterol: 112 mg/dL — ABNORMAL HIGH (ref 0–99)
NonHDL: 134.87
Total CHOL/HDL Ratio: 4
Triglycerides: 115 mg/dL (ref 0.0–149.0)
VLDL: 23 mg/dL (ref 0.0–40.0)

## 2022-04-25 LAB — CBC
HCT: 37.2 % — ABNORMAL LOW (ref 39.0–52.0)
Hemoglobin: 12.7 g/dL — ABNORMAL LOW (ref 13.0–17.0)
MCHC: 34.2 g/dL (ref 30.0–36.0)
MCV: 87.4 fl (ref 78.0–100.0)
Platelets: 311 10*3/uL (ref 150.0–400.0)
RBC: 4.26 Mil/uL (ref 4.22–5.81)
RDW: 13.6 % (ref 11.5–15.5)
WBC: 8.1 10*3/uL (ref 4.0–10.5)

## 2022-04-25 MED ORDER — ZEPBOUND 2.5 MG/0.5ML ~~LOC~~ SOAJ
2.5000 mg | SUBCUTANEOUS | 0 refills | Status: DC
  Filled 2022-04-25: qty 2, 28d supply, fill #0

## 2022-04-25 MED ORDER — ZEPBOUND 7.5 MG/0.5ML ~~LOC~~ SOAJ
7.5000 mg | SUBCUTANEOUS | 0 refills | Status: DC
  Filled 2022-04-25 – 2022-06-12 (×3): qty 2, 28d supply, fill #0

## 2022-04-25 MED ORDER — ZEPBOUND 5 MG/0.5ML ~~LOC~~ SOAJ
5.0000 mg | SUBCUTANEOUS | 0 refills | Status: DC
  Filled 2022-04-25 – 2022-05-19 (×2): qty 2, 28d supply, fill #0

## 2022-04-25 NOTE — Telephone Encounter (Signed)
PA initiated via Covermymeds; KEY: [id number]. Awaiting determination.

## 2022-04-25 NOTE — Patient Instructions (Addendum)
Give [name] 2-3 business days to get the results of your labs back.   Keep the diet clean and stay active.  Please contact the GI team at: [phone] if you don't hear anything in the next 2 weeks.   Please get me a copy of your advanced directive form at your convenience.  Do not take your injection for 3 weeks to ensure there is adequate supply. Let me know if there are cost issues.   Let us know if you need anything.

## 2022-04-25 NOTE — Progress Notes (Signed)
Chief Complaint  Patient presents with   Annual Exam    Well Male Devin [name] is here for a complete physical.   His last physical was >1 year ago.  Current diet: in general, diet has been better.   Current exercise: none Weight trend: steadily increasing Fatigue out of ordinary? No. Seat belt? Yes.   Advanced directive? No  Health maintenance Tetanus- Yes HIV- Yes Hep C- Yes  Morbid Obesity: Hx of weight gain. He used to run and have a healthy diet. He has seen the [facility] Loss team 2 years ago and it did not go well. Diet/exercise as above. He has OSA that needs to be tx'd as he is not able to tolerate CPAP 2/2 deviated nasal septum.   Past Medical History:  Diagnosis Date   Allergic rhinitis    Atrial fibrillation (HCC)    Constipation    GERD (gastroesophageal reflux disease)    History of MRSA infection    Hypertension    controlled   Keratoconus of both eyes    Tremor      Past Surgical History:  Procedure Laterality Date   NECK SURGERY  02/03/14    Medications  Current Outpatient Medications on File Prior to Visit  Medication Sig Dispense Refill   EPINEPHrine 0.3 mg/0.3 mL IJ SOAJ injection Inject 0.3 mg into the muscle as needed for anaphylaxis. As needed for life-threatening allergic reactions 2 each 1   flecainide (TAMBOCOR) 50 MG tablet TAKE ONE TABLET BY MOUTH TWICE DAILY (ROUND WHITE TABLET WITH YH 777) 180 tablet 1   losartan (COZAAR) 25 MG tablet TAKE ONE TABLET BY MOUTH EVERY DAY (OVAL WHITE TABLET WITH 5 I) 90 tablet 0   montelukast (SINGULAIR) 10 MG tablet Take 1 tablet (10 mg total) by mouth at bedtime. 30 tablet 5   Olopatadine-Mometasone (RYALTRIS) 665-25 MCG/ACT SUSP 2 sprays each nostril daily 29 g 5   propranolol (INDERAL) 10 MG tablet TAKE ONE TABLET BY MOUTH THREE TIMES DAILY. (ROUND ORANGE TABLET) 270 tablet 0   venlafaxine XR (EFFEXOR XR) 37.5 MG 24 hr capsule Take 1 capsule (37.5 mg total) by mouth daily with breakfast. 90  capsule 2   nitroGLYCERIN (NITROSTAT) 0.4 MG SL tablet Place 1 tablet (0.4 mg total) under the tongue every 5 (five) minutes as needed for chest pain. 90 tablet 3   Allergies Allergies  Allergen Reactions   Shellfish Allergy Nausea And Vomiting    shrimp   Penicillins Swelling and Rash    Has patient had a PCN reaction causing immediate rash, facial/tongue/throat swelling, SOB or lightheadedness with hypotension: Yes Has patient had a PCN reaction causing severe rash involving mucus membranes or skin necrosis: No Has patient had a PCN reaction that required hospitalization: No Has patient had a PCN reaction occurring within the last 10 years: No If all of the above answers are "NO", then may proceed with Cephalosporin use. REACTION: causes rash and sob.    Family History Family History  Problem Relation Age of Onset   Diabetes Mother    Hyperlipidemia Mother    Heart disease Mother        CHF   Thyroid disease Mother        Graves Disease   Cancer Mother    Alcoholism Mother    Hypertension Father    Alcoholism Father    Drug abuse Father    Obesity Father    Brain cancer Brother  GBM   Asthma Daughter    ADD / ADHD Daughter     Review of Systems: Constitutional: no fevers or chills Eye:  no recent significant change in vision Ear/Nose/Mouth/Throat:  Ears:  no hearing loss Nose/Mouth/Throat:  no complaints of nasal congestion, no sore throat Cardiovascular:  no chest pain Respiratory:  no shortness of breath Gastrointestinal:  no abdominal pain, no change in bowel habits GU:  Male: negative for dysuria, frequency, and incontinence Musculoskeletal/Extremities:  no pain of the joints Integumentary (Skin/Breast):  no abnormal skin lesions reported Neurologic:  no headaches Endocrine: No unexpected weight changes Hematologic/Lymphatic:  no night sweats  Exam BP 123/81 (BP Location: Left Arm, Patient Position: Sitting, Cuff Size: Large)   Pulse (!) 55   Temp  97.9 F (36.6 C) (Oral)   Ht '5\' 10"'$  (1.778 m)   Wt 291 lb 2 oz (132.1 kg)   SpO2 99%   BMI 41.77 kg/m  General:  well developed, well nourished, in no apparent distress Skin:  no significant moles, warts, or growths Head:  no masses, lesions, or tenderness Eyes:  pupils equal and round, sclera anicteric without injection Ears:  canals without lesions, TMs shiny without retraction, no obvious effusion, no erythema Nose:  nares patent, mucosa normal Throat/Pharynx:  lips and gingiva without lesion; tongue and uvula midline; non-inflamed pharynx; no exudates or postnasal drainage Neck: neck supple without adenopathy, thyromegaly, or masses Lungs:  clear to auscultation, breath sounds equal bilaterally, no respiratory distress Cardio:  regular rate and rhythm, no bruits, no LE edema Abdomen:  abdomen soft, nontender; bowel sounds normal; no masses or organomegaly Rectal: Deferred Musculoskeletal:  symmetrical muscle groups noted without atrophy or deformity Extremities:  no clubbing, cyanosis, or edema, no deformities, no skin discoloration Neuro:  gait normal; deep tendon reflexes normal and symmetric Psych: well oriented with normal range of affect and appropriate judgment/insight  Assessment and Plan  Well adult exam - Plan: CBC, Comprehensive metabolic panel, Lipid panel  Morbid obesity ([place])  Screen for colon cancer - Plan: Ambulatory referral to Gastroenterology   Well 52 y.o. male. Counseled on diet and exercise. Counseled on risks and benefits of prostate cancer screening with PSA. The patient agrees to forego screening.  Advanced directive form provided today.  Other orders as above. Obesity: Chronic, unstable.  He has failed basic diet/exercise, supervised weight loss program with the [facility] weight loss clinic.  We will start Zepbound 2.5 mg/week  and increase by 2.5 mg every 4 doses. He will hold on starting the medication for 3 weeks and then start due to supply  issues.  He will let me know if there are any cost issues.  At that point he will call his insurance company to see what they cover for weight loss.  We did discuss doing 90 days of phentermine if nothing else is covered.  I will tentatively see him in 7 weeks to see how things are going. The patient voiced understanding and agreement to the plan.  [place], DO 04/25/22 7:42 AM

## 2022-04-26 ENCOUNTER — Other Ambulatory Visit: Payer: Self-pay | Admitting: Family Medicine

## 2022-04-26 DIAGNOSIS — D72818 Other decreased white blood cell count: Secondary | ICD-10-CM

## 2022-04-26 LAB — IRON,TIBC AND FERRITIN PANEL
%SAT: 34 % (calc) (ref 20–48)
Ferritin: 162 ng/mL (ref 38–380)
Iron: 96 ug/dL (ref 50–180)
TIBC: 280 mcg/dL (calc) (ref 250–425)

## 2022-04-28 NOTE — Telephone Encounter (Signed)
Received Additional Clinical Information questions. Completed//PCP signed and faxed to [facility]

## 2022-04-28 NOTE — Telephone Encounter (Signed)
Received even further questions via fax- completed and faxed back w/ last OV note to [phone]. Awaiting determination.

## 2022-05-01 ENCOUNTER — Other Ambulatory Visit (HOSPITAL_BASED_OUTPATIENT_CLINIC_OR_DEPARTMENT_OTHER): Payer: Self-pay

## 2022-05-01 NOTE — Telephone Encounter (Signed)
Patient informed of PA response//instructions

## 2022-05-01 NOTE — Telephone Encounter (Signed)
PA approved.   Approved. Please Note: this approval is for three months due to a verified shortage of a preferred medication Please Also note: This medication is to be titrated up in strength every 4 weeks as tolerated by the member. The program limit of 4 pens per 180 days of the 2.'5mg'$ /0.46m strength allows for this dose increase

## 2022-05-16 ENCOUNTER — Other Ambulatory Visit (HOSPITAL_BASED_OUTPATIENT_CLINIC_OR_DEPARTMENT_OTHER): Payer: Self-pay

## 2022-05-17 ENCOUNTER — Other Ambulatory Visit (HOSPITAL_BASED_OUTPATIENT_CLINIC_OR_DEPARTMENT_OTHER): Payer: Self-pay

## 2022-05-17 ENCOUNTER — Encounter: Payer: Self-pay | Admitting: Family Medicine

## 2022-05-17 ENCOUNTER — Other Ambulatory Visit: Payer: Self-pay | Admitting: Family Medicine

## 2022-05-17 MED ORDER — ZEPBOUND 2.5 MG/0.5ML ~~LOC~~ SOAJ
2.5000 mg | SUBCUTANEOUS | 0 refills | Status: DC
  Filled 2022-05-17: qty 2, 28d supply, fill #0

## 2022-05-17 NOTE — Telephone Encounter (Signed)
There was a Pharmacist, community message from his wife. Have only completed a PA for the 2.5

## 2022-05-17 NOTE — Telephone Encounter (Signed)
Does he need the 5 mg dosage or want to stay with the 2.5?

## 2022-05-18 ENCOUNTER — Other Ambulatory Visit: Payer: Self-pay

## 2022-05-19 ENCOUNTER — Other Ambulatory Visit (HOSPITAL_BASED_OUTPATIENT_CLINIC_OR_DEPARTMENT_OTHER): Payer: Self-pay

## 2022-05-24 ENCOUNTER — Other Ambulatory Visit (INDEPENDENT_AMBULATORY_CARE_PROVIDER_SITE_OTHER): Payer: BC Managed Care – PPO

## 2022-05-24 DIAGNOSIS — D72818 Other decreased white blood cell count: Secondary | ICD-10-CM | POA: Diagnosis not present

## 2022-05-24 NOTE — Addendum Note (Signed)
Addended by: Kelle Darting A on: 05/24/2022 07:10 AM   Modules accepted: Orders

## 2022-05-25 LAB — CBC WITH DIFFERENTIAL/PLATELET
Absolute Monocytes: 317 cells/uL (ref 200–950)
Basophils Absolute: 42 cells/uL (ref 0–200)
Basophils Relative: 0.8 %
Eosinophils Absolute: 229 cells/uL (ref 15–500)
Eosinophils Relative: 4.4 %
HCT: 36.2 % — ABNORMAL LOW (ref 38.5–50.0)
Hemoglobin: 12 g/dL — ABNORMAL LOW (ref 13.2–17.1)
Lymphs Abs: 1804.4 cells/uL (ref 850–3900)
MCH: 29.3 pg (ref 27.0–33.0)
MCHC: 33.1 g/dL (ref 32.0–36.0)
MCV: 88.3 fL (ref 80.0–100.0)
MPV: 9.7 fL (ref 7.5–12.5)
Monocytes Relative: 6.1 %
Neutro Abs: 2808 cells/uL (ref 1500–7800)
Neutrophils Relative %: 54 %
Platelets: 299 10*3/uL (ref 140–400)
RBC: 4.1 10*6/uL — ABNORMAL LOW (ref 4.20–5.80)
RDW: 13 % (ref 11.0–15.0)
Total Lymphocyte: 34.7 %
WBC: 5.2 10*3/uL (ref 3.8–10.8)

## 2022-05-25 LAB — PATHOLOGIST SMEAR REVIEW

## 2022-05-26 ENCOUNTER — Other Ambulatory Visit (HOSPITAL_BASED_OUTPATIENT_CLINIC_OR_DEPARTMENT_OTHER): Payer: Self-pay

## 2022-05-31 DIAGNOSIS — J310 Chronic rhinitis: Secondary | ICD-10-CM | POA: Diagnosis not present

## 2022-05-31 DIAGNOSIS — J342 Deviated nasal septum: Secondary | ICD-10-CM | POA: Diagnosis not present

## 2022-05-31 DIAGNOSIS — J343 Hypertrophy of nasal turbinates: Secondary | ICD-10-CM | POA: Diagnosis not present

## 2022-06-09 ENCOUNTER — Other Ambulatory Visit: Payer: Self-pay | Admitting: Otolaryngology

## 2022-06-12 ENCOUNTER — Telehealth: Payer: Self-pay

## 2022-06-12 ENCOUNTER — Other Ambulatory Visit (HOSPITAL_BASED_OUTPATIENT_CLINIC_OR_DEPARTMENT_OTHER): Payer: Self-pay

## 2022-06-12 NOTE — Telephone Encounter (Signed)
Canceled. Please note that authorization is already on file for the requested medication and is effective through (07/24/2022).

## 2022-06-12 NOTE — Telephone Encounter (Signed)
PA initiated via Covermymeds; KEY: [id number]. Awaiting determination.

## 2022-06-13 ENCOUNTER — Ambulatory Visit: Payer: BC Managed Care – PPO | Admitting: Family Medicine

## 2022-06-13 ENCOUNTER — Other Ambulatory Visit (HOSPITAL_BASED_OUTPATIENT_CLINIC_OR_DEPARTMENT_OTHER): Payer: Self-pay

## 2022-06-13 ENCOUNTER — Encounter: Payer: Self-pay | Admitting: Family Medicine

## 2022-06-13 DIAGNOSIS — D649 Anemia, unspecified: Secondary | ICD-10-CM

## 2022-06-13 LAB — CBC
HCT: 38.1 % — ABNORMAL LOW (ref 39.0–52.0)
Hemoglobin: 13.2 g/dL (ref 13.0–17.0)
MCHC: 34.6 g/dL (ref 30.0–36.0)
MCV: 87.6 fl (ref 78.0–100.0)
Platelets: 317 10*3/uL (ref 150.0–400.0)
RBC: 4.35 Mil/uL (ref 4.22–5.81)
RDW: 14 % (ref 11.5–15.5)
WBC: 7.3 10*3/uL (ref 4.0–10.5)

## 2022-06-13 MED ORDER — ZEPBOUND 7.5 MG/0.5ML ~~LOC~~ SOAJ
7.5000 mg | SUBCUTANEOUS | 0 refills | Status: DC
Start: 2022-06-13 — End: 2022-06-29
  Filled 2022-06-13 – 2022-06-29 (×7): qty 2, 28d supply, fill #0

## 2022-06-13 MED ORDER — TIRZEPATIDE-WEIGHT MANAGEMENT 10 MG/0.5ML ~~LOC~~ SOAJ
10.0000 mg | SUBCUTANEOUS | 0 refills | Status: DC
Start: 2022-07-11 — End: 2022-06-29
  Filled 2022-06-13: qty 2, 28d supply, fill #0

## 2022-06-13 MED ORDER — TIRZEPATIDE-WEIGHT MANAGEMENT 15 MG/0.5ML ~~LOC~~ SOAJ
15.0000 mg | SUBCUTANEOUS | 2 refills | Status: DC
Start: 2022-09-05 — End: 2022-06-29
  Filled 2022-06-13: qty 6, 84d supply, fill #0

## 2022-06-13 MED ORDER — TIRZEPATIDE-WEIGHT MANAGEMENT 12.5 MG/0.5ML ~~LOC~~ SOAJ
12.5000 mg | SUBCUTANEOUS | 0 refills | Status: DC
Start: 2022-08-08 — End: 2022-06-29
  Filled 2022-06-13: qty 2, 28d supply, fill #0

## 2022-06-13 MED ORDER — ZEPBOUND 7.5 MG/0.5ML ~~LOC~~ SOAJ
7.5000 mg | SUBCUTANEOUS | 0 refills | Status: DC
  Filled 2022-06-13: qty 2, 28d supply, fill #0

## 2022-06-13 NOTE — Patient Instructions (Addendum)
Very strong work losing weight.   Keep the diet clean and stay active.  Give [name] 2-3 business days to get the results of your labs back.   Please contact the GI team at: [phone]  Let us know if you need anything.

## 2022-06-13 NOTE — Progress Notes (Signed)
Chief Complaint  Patient presents with   Follow-up    Subjective: Patient is a 52 y.o. male here for weight follow up.  Lost 17 lbs on Zepbound. Reports compliance, no AE's. Appetite much lower. Cravings are also lower. Has been walking for exercise. Currently on the 5 mg/week dosage.   Past Medical History:  Diagnosis Date   Allergic rhinitis    Atrial fibrillation    Constipation    GERD (gastroesophageal reflux disease)    History of MRSA infection    Hypertension    controlled   Keratoconus of both eyes    Tremor     Objective: BP 118/78 (BP Location: Left Arm, Patient Position: Sitting, Cuff Size: Large)   Pulse 61   Temp 97.9 F (36.6 C) (Oral)   Ht  (1.778 m)   Wt 274 lb 4 oz (124.4 kg)   SpO2 95%   BMI 39.35 kg/m  General: Awake, appears stated age Heart: RRR, no LE edema Lungs: CTAB, no rales, wheezes or rhonchi. No accessory muscle use Psych: Age appropriate judgment and insight, normal affect and mood  Assessment and Plan: Morbid obesity - Plan: tirzepatide (ZEPBOUND) 7.5 MG/0.5ML Pen, tirzepatide (ZEPBOUND) 10 MG/0.5ML Pen, tirzepatide (ZEPBOUND) 12.5 MG/0.5ML Pen, tirzepatide (ZEPBOUND) 15 MG/0.5ML Pen, DISCONTINUED: tirzepatide (ZEPBOUND) 7.5 MG/0.5ML Pen  Normocytic anemia - Plan: CBC  Chronic, not fully controlled. Increase Zepbound to 7.5 mg/week and will cont to increase until highest tolerated dosage vs 15 mg/week. He will let me know if there are issues. Counseled on diet/exercise. Workup neg. Anemia of chronic disease? Has CCS ordered. Fe studies WNL. [facility]I info provided today. Will ensure stability for now.  F/u in 6 mo for CPE.  The patient voiced understanding and agreement to the plan.  Jilda Roche Pennock, DO 06/13/22  8:01 AM

## 2022-06-15 ENCOUNTER — Ambulatory Visit (AMBULATORY_SURGERY_CENTER): Payer: BC Managed Care – PPO

## 2022-06-15 ENCOUNTER — Encounter: Payer: Self-pay | Admitting: Internal Medicine

## 2022-06-15 VITALS — Ht 70.0 in | Wt 274.0 lb

## 2022-06-15 DIAGNOSIS — Z1211 Encounter for screening for malignant neoplasm of colon: Secondary | ICD-10-CM

## 2022-06-15 MED ORDER — NA SULFATE-K SULFATE-MG SULF 17.5-3.13-1.6 GM/177ML PO SOLN: 1.0000 | Freq: Once | ORAL | 0 refills | Status: AC

## 2022-06-15 NOTE — Progress Notes (Signed)
Pre visit completed via phone call; Patient verified name, DOB, and address;  No egg or soy allergy known to patient; No issues known to pt with past sedation with any surgeries or procedures; Patient denies ever being told they had issues or difficulty with intubation;  No FH of Malignant Hyperthermia; Pt is not on diet pills; Pt is not on home 02;  Pt is not on blood thinners;  Pt denies issues with constipation  AFIB - no BT, on Flecanide;  Have any cardiac testing pending--NO Pt instructed to use Singlecare.com or GoodRx for a price reduction on prep   Insurance verified during PV appt=BCBS  Patient's chart reviewed by Cathlyn Parsons CNRA prior to previsit and patient appropriate for the [facility].  Previsit completed and red dot placed by patient's name on their procedure day (on provider's schedule).   Instructions printed per patients request. question

## 2022-06-16 ENCOUNTER — Encounter: Payer: Self-pay | Admitting: Family Medicine

## 2022-06-16 ENCOUNTER — Other Ambulatory Visit (HOSPITAL_BASED_OUTPATIENT_CLINIC_OR_DEPARTMENT_OTHER): Payer: Self-pay

## 2022-06-19 ENCOUNTER — Telehealth: Payer: Self-pay | Admitting: Internal Medicine

## 2022-06-19 ENCOUNTER — Encounter: Payer: Self-pay | Admitting: Internal Medicine

## 2022-06-19 ENCOUNTER — Other Ambulatory Visit (HOSPITAL_BASED_OUTPATIENT_CLINIC_OR_DEPARTMENT_OTHER): Payer: Self-pay

## 2022-06-19 NOTE — Telephone Encounter (Signed)
Pt.has instructions for 2 day miralax prep,instructed pt. To go by instructions sheet and to disregard prep sent in to pharmacy,verbalized understanding all questions answered.

## 2022-06-19 NOTE — Telephone Encounter (Signed)
Patient's wife is calling said the pharmacy called and let them know that there is a prep kit there for them, but the instructions only mention Miralax. Please advise. States you can either send message on MyChart or call his wife

## 2022-06-21 ENCOUNTER — Ambulatory Visit (AMBULATORY_SURGERY_CENTER): Payer: BC Managed Care – PPO | Admitting: Internal Medicine

## 2022-06-21 ENCOUNTER — Encounter: Payer: Self-pay | Admitting: Internal Medicine

## 2022-06-21 ENCOUNTER — Other Ambulatory Visit (HOSPITAL_BASED_OUTPATIENT_CLINIC_OR_DEPARTMENT_OTHER): Payer: Self-pay

## 2022-06-21 ENCOUNTER — Other Ambulatory Visit: Payer: Self-pay | Admitting: Family Medicine

## 2022-06-21 VITALS — BP 116/63 | HR 50 | Temp 98.4°F | Resp 16 | Ht 70.0 in | Wt 274.0 lb

## 2022-06-21 DIAGNOSIS — Z1211 Encounter for screening for malignant neoplasm of colon: Secondary | ICD-10-CM | POA: Diagnosis not present

## 2022-06-21 MED ORDER — SODIUM CHLORIDE 0.9 % IV SOLN
500.0000 mL | Freq: Once | INTRAVENOUS | Status: DC
Start: 2022-06-21 — End: 2022-06-21

## 2022-06-21 NOTE — Op Note (Signed)
[facility] Patient Name: Devin [name] Procedure Date: 06/21/2022 3:10 PM MRN: [id number] Endoscopist: Iva Boop , MD, [id number] Age: 52 Referring MD:  Date of Birth: Aug 16, 1970 Gender: Male Account #: [id number] Procedure:                Colonoscopy Indications:              Screening for colorectal malignant neoplasm, This                            is the patient's first colonoscopy Medicines:                Monitored Anesthesia Care Procedure:                Pre-Anesthesia Assessment:                           - Prior to the procedure, a History and Physical                            was performed, and patient medications and                            allergies were reviewed. The patient's tolerance of                            previous anesthesia was also reviewed. The risks                            and benefits of the procedure and the sedation                            options and risks were discussed with the patient.                            All questions were answered, and informed consent                            was obtained. Prior Anticoagulants: The patient has                            taken no anticoagulant or antiplatelet agents. ASA                            Grade Assessment: III - A patient with severe                            systemic disease. After reviewing the risks and                            benefits, the patient was deemed in satisfactory                            condition to undergo the procedure.  After obtaining informed consent, the colonoscope                            was passed under direct vision. Throughout the                            procedure, the patient's blood pressure, pulse, and                            oxygen saturations were monitored continuously. The                            Olympus SN [id number] was introduced through the anus                            and advanced to the  the cecum, identified by                            appendiceal orifice and ileocecal valve. The                            colonoscopy was performed without difficulty. The                            patient tolerated the procedure well. The quality                            of the bowel preparation was good. The ileocecal                            valve, appendiceal orifice, and rectum were                            photographed. The bowel preparation used was SUPREP                            via split dose instruction. Scope In: 3:24:33 PM Scope Out: 3:33:30 PM Scope Withdrawal Time: 0 hours 6 minutes 46 seconds  Total Procedure Duration: 0 hours 8 minutes 57 seconds  Findings:                 The perianal and digital rectal examinations were                            normal. Pertinent negatives include normal prostate                            (size, shape, and consistency).                           Multiple diverticula were found in the sigmoid                            colon and ascending colon.  The exam was otherwise without abnormality on                            direct and retroflexion views. Complications:            No immediate complications. Estimated Blood Loss:     Estimated blood loss: none. Impression:               - Diverticulosis in the sigmoid colon and in the                            ascending colon.                           - The examination was otherwise normal on direct                            and retroflexion views.                           - No specimens collected. Recommendation:           - Patient has a contact number available for                            emergencies. The signs and symptoms of potential                            delayed complications were discussed with the                            patient. Return to normal activities tomorrow.                            Written discharge instructions were  provided to the                            patient.                           - Resume previous diet.                           - Continue present medications.                           - Repeat colonoscopy in 10 years for screening                            purposes. Iva Boop, MD 06/21/2022 3:40:30 PM This report has been signed electronically.

## 2022-06-21 NOTE — Progress Notes (Signed)
[facility] History and Physical   Primary Care Physician:  Sharlene Dory, DO   Reason for Procedure:   CRCA screening  Plan:    colonoscopy     HPI: Devin [name] is a 52 y.o. male for screening exam   Past Medical History:  Diagnosis Date   Allergic rhinitis    Anemia    not on meds at this time (06/15/2022)   Anxiety    on meds   Atrial fibrillation (HCC)    Constipation    Depression    on meds   GERD (gastroesophageal reflux disease)    hx of -random epidodes   History of MRSA infection    Hypertension    controlled   Keratoconus of both eyes    Seasonal allergies    Sleep apnea    not using CPAP at this time (06/15/2022   Tremor     Past Surgical History:  Procedure Laterality Date   NECK SURGERY  02/03/14    Prior to Admission medications   Medication Sig Start Date End Date Taking? Authorizing Provider  flecainide (TAMBOCOR) 50 MG tablet TAKE ONE TABLET BY MOUTH TWICE DAILY (ROUND WHITE TABLET WITH YH 777) 01/06/22  Yes Tobb, Kardie, DO  Loratadine (CLARITIN PO) Take 1 tablet by mouth daily at 6 (six) AM.   Yes [provider]  losartan (COZAAR) 25 MG tablet TAKE ONE TABLET BY MOUTH EVERY DAY (OVAL WHITE TABLET WITH 5 I) 04/07/22  Yes Tobb, Kardie, DO  montelukast (SINGULAIR) 10 MG tablet Take 1 tablet (10 mg total) by mouth at bedtime. 03/23/22  Yes Kozlow, Alvira Philips, MD  propranolol (INDERAL) 10 MG tablet TAKE ONE TABLET BY MOUTH THREE TIMES DAILY. (ROUND ORANGE TABLET) 12/26/21  Yes Tobb, Kardie, DO  venlafaxine XR (EFFEXOR XR) 37.5 MG 24 hr capsule Take 1 capsule (37.5 mg total) by mouth daily with breakfast. 12/15/21  Yes Wendling, Jilda Roche, DO  EPINEPHrine 0.3 mg/0.3 mL IJ SOAJ injection Inject 0.3 mg into the muscle as needed for anaphylaxis. As needed for life-threatening allergic reactions 03/23/22   Kozlow, Alvira Philips, MD  meloxicam (MOBIC) 15 MG tablet Take 15 mg by mouth daily. Patient not taking: Reported on  06/21/2022 05/22/22   [provider]  nitroGLYCERIN (NITROSTAT) 0.4 MG SL tablet Place 1 tablet (0.4 mg total) under the tongue every 5 (five) minutes as needed for chest pain. 09/22/20 06/15/22  Tobb, Lavona Mound, DO  Olopatadine-Mometasone (RYALTRIS) [phone] MCG/ACT SUSP 2 sprays each nostril daily 03/23/22   Kozlow, Alvira Philips, MD  tirzepatide [facility]) 10 MG/0.5ML Pen Inject 10 mg into the skin once a week for 28 days. Patient not taking: Reported on 06/21/2022 07/11/22 08/08/22  Sharlene Dory, DO  tirzepatide [facility]) 12.5 MG/0.5ML Pen Inject 12.5 mg into the skin once a week for 28 days. Patient not taking: Reported on 06/21/2022 08/08/22 09/05/22  Sharlene Dory, DO  tirzepatide [facility]) 15 MG/0.5ML Pen Inject 15 mg into the skin once a week. Patient not taking: Reported on 06/21/2022 09/05/22   Sharlene Dory, DO  tirzepatide (ZEPBOUND) 7.5 MG/0.5ML Pen Inject 7.5 mg into the skin once a week for 28 days. 06/13/22 07/11/22  Sharlene Dory, DO    Current Outpatient Medications  Medication Sig Dispense Refill   flecainide (TAMBOCOR) 50 MG tablet TAKE ONE TABLET BY MOUTH TWICE DAILY (ROUND WHITE TABLET WITH YH 777) 180 tablet 1   Loratadine (CLARITIN PO) Take 1 tablet by mouth daily at 6 (  six) AM.     losartan (COZAAR) 25 MG tablet TAKE ONE TABLET BY MOUTH EVERY DAY (OVAL WHITE TABLET WITH 5 I) 90 tablet 0   montelukast (SINGULAIR) 10 MG tablet Take 1 tablet (10 mg total) by mouth at bedtime. 30 tablet 5   propranolol (INDERAL) 10 MG tablet TAKE ONE TABLET BY MOUTH THREE TIMES DAILY. (ROUND ORANGE TABLET) 270 tablet 0   venlafaxine XR (EFFEXOR XR) 37.5 MG 24 hr capsule Take 1 capsule (37.5 mg total) by mouth daily with breakfast. 90 capsule 2   EPINEPHrine 0.3 mg/0.3 mL IJ SOAJ injection Inject 0.3 mg into the muscle as needed for anaphylaxis. As needed for life-threatening allergic reactions 2 each 1   meloxicam (MOBIC) 15 MG tablet Take 15 mg by mouth daily. (Patient  not taking: Reported on 06/21/2022)     nitroGLYCERIN (NITROSTAT) 0.4 MG SL tablet Place 1 tablet (0.4 mg total) under the tongue every 5 (five) minutes as needed for chest pain. 90 tablet 3   Olopatadine-Mometasone (RYALTRIS) 665-25 MCG/ACT SUSP 2 sprays each nostril daily 29 g 5   [START ON 07/11/2022] tirzepatide (ZEPBOUND) 10 MG/0.5ML Pen Inject 10 mg into the skin once a week for 28 days. (Patient not taking: Reported on 06/21/2022) 2 mL 0   [START ON 08/08/2022] tirzepatide (ZEPBOUND) 12.5 MG/0.5ML Pen Inject 12.5 mg into the skin once a week for 28 days. (Patient not taking: Reported on 06/21/2022) 2 mL 0   [START ON 09/05/2022] tirzepatide (ZEPBOUND) 15 MG/0.5ML Pen Inject 15 mg into the skin once a week. (Patient not taking: Reported on 06/21/2022) 6 mL 2   tirzepatide (ZEPBOUND) 7.5 MG/0.5ML Pen Inject 7.5 mg into the skin once a week for 28 days. 2 mL 0   Current Facility-Administered Medications  Medication Dose Route Frequency Provider Last Rate Last Admin   0.9 %  sodium chloride infusion  500 mL Intravenous Once Iva Boop, MD        Allergies as of 06/21/2022 - Review Complete 06/21/2022  Allergen Reaction Noted   Shellfish allergy Nausea And Vomiting 04/14/2014   Penicillins Swelling and Rash     Family History  Problem Relation Age of Onset   Diabetes Mother    Hyperlipidemia Mother    Heart disease Mother        CHF   Thyroid disease Mother        Graves Disease   Cancer Mother    Alcoholism Mother    Colon polyps Father    Hypertension Father    Alcoholism Father    Drug abuse Father    Obesity Father    Colon polyps Sister    Brain cancer Brother        GBM   Asthma Daughter    ADD / ADHD Daughter    Colon cancer Neg Hx    Esophageal cancer Neg Hx    Stomach cancer Neg Hx    Rectal cancer Neg Hx     Social History   Socioeconomic History   Marital status: Married    Spouse name: Jaikob Borgwardt   Number of children: 3   Years of education: Not on file    Highest education level: Not on file  Occupational History   Occupation: Heritage manager: [employer]  Tobacco Use   Smoking status: Never   Smokeless tobacco: Never  Vaping Use   Vaping Use: Never used  Substance and Sexual Activity   Alcohol use: Not Currently  Alcohol/week: 0.0 - 5.0 standard drinks of alcohol    Comment: Rarely   Drug use: Never   Sexual activity: Not on file  Other Topics Concern   Not on file  Social History Narrative   Regular exercise: yes (runs and uses elliptical machine)Quit school in the 8th grade.   One story    Right Handed    Drinks caffeine   Social Determinants of Health   Financial Resource Strain: Not on file  Food Insecurity: Not on file  Transportation Needs: Not on file  Physical Activity: Not on file  Stress: Not on file  Social Connections: Not on file  Intimate Partner Violence: Not on file    Review of Systems:  All other review of systems negative except as mentioned in the HPI.  Physical Exam: Vital signs BP (!) 151/89   Pulse 61   Temp 98.4 F (36.9 C)   Resp 15   Ht 5\' 10"  (1.778 m)   Wt 274 lb (124.3 kg)   SpO2 100%   BMI 39.31 kg/m   General:   Alert,  Well-developed, well-nourished, pleasant and cooperative in NAD Lungs:  Clear throughout to auscultation.   Heart:  Regular rate and rhythm; no murmurs, clicks, rubs,  or gallops. Abdomen:  Soft, nontender and nondistended. Normal bowel sounds.   Neuro/Psych:  Alert and cooperative. Normal mood and affect. A and O x 3   @Jonathon Tan  Sena Slate, MD, [facility] [phone] (pager) 06/21/2022 3:14 PM@

## 2022-06-21 NOTE — Progress Notes (Signed)
To pacu, VSS. Report to [email or web address] 

## 2022-06-21 NOTE — Patient Instructions (Addendum)
No polyps or cancer were seen.  You do have diverticulosis - thickened muscle rings and pouches in the colon wall. Please read the handout about this condition.  Next routine colonoscopy or other screening test in 10 years - 2034.  I appreciate the opportunity to care for you. Iva Boop, MD, FACG  YOU HAD AN ENDOSCOPIC PROCEDURE TODAY AT THE [facility]:   Refer to the procedure report that was given to you for any specific questions about what was found during the examination.  If the procedure report does not answer your questions, please call your gastroenterologist to clarify.  If you requested that your care partner not be given the details of your procedure findings, then the procedure report has been included in a sealed envelope for you to review at your convenience later.  YOU SHOULD EXPECT: Some feelings of bloating in the abdomen. Passage of more gas than usual.  Walking can help get rid of the air that was put into your GI tract during the procedure and reduce the bloating. If you had a lower endoscopy (such as a colonoscopy or flexible sigmoidoscopy) you may notice spotting of blood in your stool or on the toilet paper. If you underwent a bowel prep for your procedure, you may not have a normal bowel movement for a few days.  Please Note:  You might notice some irritation and congestion in your nose or some drainage.  This is from the oxygen used during your procedure.  There is no need for concern and it should clear up in a day or so.  SYMPTOMS TO REPORT IMMEDIATELY:  Following lower endoscopy (colonoscopy or flexible sigmoidoscopy):  Excessive amounts of blood in the stool  Significant tenderness or worsening of abdominal pains  Swelling of the abdomen that is new, acute  Fever of 100F or higher  For urgent or emergent issues, a gastroenterologist can be reached at any hour by calling [phone]. Do not use MyChart messaging for urgent concerns.     DIET:  We do recommend a small meal at first, but then you may proceed to your regular diet.  Drink plenty of fluids but you should avoid alcoholic beverages for 24 hours.  ACTIVITY:  You should plan to take it easy for the rest of today and you should NOT DRIVE or use heavy machinery until tomorrow (because of the sedation medicines used during the test).    FOLLOW UP: Our staff will call the number listed on your records the next business day following your procedure.  We will call around 7:15- 8:00 am to check on you and address any questions or concerns that you may have regarding the information given to you following your procedure. If we do not reach you, we will leave a message.      SIGNATURES/CONFIDENTIALITY: You and/or your care partner have signed paperwork which will be entered into your electronic medical record.  These signatures attest to the fact that that the information above on your After Visit Summary has been reviewed and is understood.  Full responsibility of the confidentiality of this discharge information lies with you and/or your care-partner.

## 2022-06-22 ENCOUNTER — Other Ambulatory Visit (HOSPITAL_BASED_OUTPATIENT_CLINIC_OR_DEPARTMENT_OTHER): Payer: Self-pay

## 2022-06-22 ENCOUNTER — Telehealth: Payer: Self-pay

## 2022-06-22 ENCOUNTER — Other Ambulatory Visit: Payer: Self-pay | Admitting: Family Medicine

## 2022-06-22 NOTE — Telephone Encounter (Signed)
  Follow up Call-     06/21/2022    2:26 PM  Call back number  Post procedure Call Back phone  # [phone]  Permission to leave phone message Yes     Patient questions:  Do you have a fever, pain , or abdominal swelling? No. Pain Score  0 *  Have you tolerated food without any problems? Yes.    Have you been able to return to your normal activities? Yes.    Do you have any questions about your discharge instructions: Diet   No. Medications  No. Follow up visit  No.  Do you have questions or concerns about your Care? No.  Actions: * If pain score is 4 or above: No action needed, pain <4.

## 2022-06-23 ENCOUNTER — Other Ambulatory Visit (HOSPITAL_BASED_OUTPATIENT_CLINIC_OR_DEPARTMENT_OTHER): Payer: Self-pay

## 2022-06-29 ENCOUNTER — Encounter: Payer: Self-pay | Admitting: Family Medicine

## 2022-06-29 ENCOUNTER — Other Ambulatory Visit (HOSPITAL_BASED_OUTPATIENT_CLINIC_OR_DEPARTMENT_OTHER): Payer: Self-pay

## 2022-06-29 ENCOUNTER — Other Ambulatory Visit: Payer: Self-pay | Admitting: Family Medicine

## 2022-06-29 MED ORDER — ZEPBOUND 5 MG/0.5ML ~~LOC~~ SOAJ
5.0000 mg | SUBCUTANEOUS | 3 refills | Status: DC
  Filled 2022-06-29 – 2022-07-04 (×2): qty 2, 28d supply, fill #0
  Filled 2022-08-02 – 2022-08-03 (×2): qty 2, 28d supply, fill #1

## 2022-06-30 ENCOUNTER — Other Ambulatory Visit (HOSPITAL_BASED_OUTPATIENT_CLINIC_OR_DEPARTMENT_OTHER): Payer: Self-pay

## 2022-07-03 ENCOUNTER — Other Ambulatory Visit: Payer: Self-pay | Admitting: Cardiology

## 2022-07-03 ENCOUNTER — Other Ambulatory Visit (HOSPITAL_BASED_OUTPATIENT_CLINIC_OR_DEPARTMENT_OTHER): Payer: Self-pay

## 2022-07-04 ENCOUNTER — Other Ambulatory Visit (HOSPITAL_BASED_OUTPATIENT_CLINIC_OR_DEPARTMENT_OTHER): Payer: Self-pay

## 2022-07-04 ENCOUNTER — Other Ambulatory Visit: Payer: Self-pay

## 2022-07-05 ENCOUNTER — Other Ambulatory Visit (HOSPITAL_COMMUNITY): Payer: Self-pay

## 2022-07-06 ENCOUNTER — Other Ambulatory Visit (HOSPITAL_BASED_OUTPATIENT_CLINIC_OR_DEPARTMENT_OTHER): Payer: Self-pay

## 2022-07-07 ENCOUNTER — Ambulatory Visit: Admit: 2022-07-07 | Payer: BC Managed Care – PPO | Admitting: Otolaryngology

## 2022-07-07 SURGERY — SEPTOPLASTY, NOSE, WITH NASAL TURBINATE REDUCTION
Anesthesia: General | Laterality: Bilateral

## 2022-07-27 ENCOUNTER — Other Ambulatory Visit: Payer: Self-pay | Admitting: Family Medicine

## 2022-08-02 ENCOUNTER — Other Ambulatory Visit (HOSPITAL_BASED_OUTPATIENT_CLINIC_OR_DEPARTMENT_OTHER): Payer: Self-pay

## 2022-08-03 ENCOUNTER — Telehealth: Payer: Self-pay

## 2022-08-03 ENCOUNTER — Other Ambulatory Visit (HOSPITAL_BASED_OUTPATIENT_CLINIC_OR_DEPARTMENT_OTHER): Payer: Self-pay

## 2022-08-03 ENCOUNTER — Encounter: Payer: Self-pay | Admitting: Family Medicine

## 2022-08-03 MED ORDER — ZEPBOUND 7.5 MG/0.5ML ~~LOC~~ SOAJ
7.5000 mg | SUBCUTANEOUS | 0 refills | Status: DC
  Filled 2022-08-03: qty 2, 28d supply, fill #0

## 2022-08-03 NOTE — Telephone Encounter (Signed)
PA initiated via Covermymeds; KEY: [id number]. Awaiting determination.

## 2022-08-04 ENCOUNTER — Other Ambulatory Visit (HOSPITAL_BASED_OUTPATIENT_CLINIC_OR_DEPARTMENT_OTHER): Payer: Self-pay

## 2022-08-07 ENCOUNTER — Other Ambulatory Visit (HOSPITAL_BASED_OUTPATIENT_CLINIC_OR_DEPARTMENT_OTHER): Payer: Self-pay

## 2022-08-07 NOTE — Telephone Encounter (Signed)
PA approved.   Approved. Authorization Expiration Date: 12/07/2022

## 2022-08-07 NOTE — Telephone Encounter (Signed)
Patient informed of approval.  

## 2022-08-15 ENCOUNTER — Other Ambulatory Visit: Payer: Self-pay | Admitting: Cardiology

## 2022-08-31 ENCOUNTER — Other Ambulatory Visit: Payer: Self-pay | Admitting: Cardiology

## 2022-09-04 ENCOUNTER — Other Ambulatory Visit (HOSPITAL_BASED_OUTPATIENT_CLINIC_OR_DEPARTMENT_OTHER): Payer: Self-pay

## 2022-09-04 ENCOUNTER — Other Ambulatory Visit: Payer: Self-pay | Admitting: Family Medicine

## 2022-09-04 MED ORDER — ZEPBOUND 7.5 MG/0.5ML ~~LOC~~ SOAJ
7.5000 mg | SUBCUTANEOUS | 0 refills | Status: DC
  Filled 2022-09-04: qty 2, 28d supply, fill #0

## 2022-09-05 ENCOUNTER — Other Ambulatory Visit: Payer: Self-pay | Admitting: Cardiology

## 2022-09-06 ENCOUNTER — Other Ambulatory Visit: Payer: Self-pay | Admitting: Cardiology

## 2022-10-02 ENCOUNTER — Other Ambulatory Visit (HOSPITAL_BASED_OUTPATIENT_CLINIC_OR_DEPARTMENT_OTHER): Payer: Self-pay

## 2022-10-02 ENCOUNTER — Other Ambulatory Visit: Payer: Self-pay | Admitting: Family Medicine

## 2022-10-02 DIAGNOSIS — H6121 Impacted cerumen, right ear: Secondary | ICD-10-CM | POA: Diagnosis not present

## 2022-10-02 DIAGNOSIS — H6691 Otitis media, unspecified, right ear: Secondary | ICD-10-CM | POA: Diagnosis not present

## 2022-10-02 MED ORDER — ZEPBOUND 7.5 MG/0.5ML ~~LOC~~ SOAJ
7.5000 mg | SUBCUTANEOUS | 0 refills | Status: DC
  Filled 2022-10-02: qty 2, 28d supply, fill #0

## 2022-10-24 ENCOUNTER — Other Ambulatory Visit: Payer: Self-pay | Admitting: Family Medicine

## 2022-11-01 ENCOUNTER — Encounter: Payer: Self-pay | Admitting: Family Medicine

## 2022-11-01 ENCOUNTER — Other Ambulatory Visit (HOSPITAL_BASED_OUTPATIENT_CLINIC_OR_DEPARTMENT_OTHER): Payer: Self-pay

## 2022-11-01 ENCOUNTER — Ambulatory Visit: Payer: BC Managed Care – PPO | Admitting: Family Medicine

## 2022-11-01 VITALS — BP 120/78 | HR 59 | Temp 98.0°F | Ht 70.0 in | Wt 259.4 lb

## 2022-11-01 DIAGNOSIS — E669 Obesity, unspecified: Secondary | ICD-10-CM | POA: Diagnosis not present

## 2022-11-01 DIAGNOSIS — F411 Generalized anxiety disorder: Secondary | ICD-10-CM | POA: Diagnosis not present

## 2022-11-01 DIAGNOSIS — Z6837 Body mass index (BMI) 37.0-37.9, adult: Secondary | ICD-10-CM | POA: Diagnosis not present

## 2022-11-01 MED ORDER — ZEPBOUND 10 MG/0.5ML ~~LOC~~ SOAJ
10.0000 mg | SUBCUTANEOUS | 0 refills | Status: AC
  Filled 2022-11-01: qty 2, 28d supply, fill #0

## 2022-11-01 MED ORDER — ZEPBOUND 15 MG/0.5ML ~~LOC~~ SOAJ
15.0000 mg | SUBCUTANEOUS | 2 refills | Status: DC
Start: 2022-12-27 — End: 2023-09-25
  Filled 2022-11-01 – 2023-01-04 (×2): qty 6, 84d supply, fill #0
  Filled 2023-03-30: qty 6, 84d supply, fill #1
  Filled 2023-06-26: qty 6, 84d supply, fill #2

## 2022-11-01 MED ORDER — ZEPBOUND 12.5 MG/0.5ML ~~LOC~~ SOAJ
12.5000 mg | SUBCUTANEOUS | 0 refills | Status: AC
  Filled 2022-11-01 – 2022-12-05 (×2): qty 2, 28d supply, fill #0

## 2022-11-01 NOTE — Patient Instructions (Signed)
Keep the diet clean and stay active.  Keep an eye on your blood pressure as you continue to lose weight.  Let us know if you need anything.

## 2022-11-01 NOTE — Progress Notes (Signed)
Chief Complaint  Patient presents with   Weight Check    Subjective Devin [name] is a 52 y.o. male who presents for fu.  Obesity Taking Zepbound 7.5 mg/week. Compliant, no AE's. Has lost around 35 lbs since starting. Still losing wt. Interested in increasing the dosage.   GAD Taking Effexor XR 37.5 mg/d. Compliant no AE"s. No SI or HI. No self medication. Not following with a therapist.    Past Medical History:  Diagnosis Date   Allergic rhinitis    Anemia    not on meds at this time (06/15/2022)   Anxiety    on meds   Atrial fibrillation (HCC)    Constipation    Depression    on meds   GERD (gastroesophageal reflux disease)    hx of -random epidodes   History of MRSA infection    Hypertension    controlled   Keratoconus of both eyes    Seasonal allergies    Sleep apnea    not using CPAP at this time (06/15/2022   Tremor     Exam BP 120/78 (BP Location: Left Arm, Patient Position: Sitting, Cuff Size: Large)   Pulse (!) 59   Temp 98 F (36.7 C) (Oral)   Ht 5\' 10"  (1.778 m)   Wt 259 lb 6 oz (117.7 kg)   SpO2 96%   BMI 37.22 kg/m  General:  well developed, well nourished, in no apparent distress Heart: RRR, no bruits, no LE edema Lungs: clear to auscultation, no accessory muscle use Psych: well oriented with normal range of affect and appropriate judgment/insight  Obesity (BMI 30-39.9)  GAD (generalized anxiety disorder)  Chronic, improving. Increase Zepbound to 10 mg/week and up-titrate to 15 mg if tolerated. Counseled on diet and exercise. Chronic, stable. Cont Effexor XR 37.5 mg/d.  F/u in 6 mo. The patient voiced understanding and agreement to the plan.  Jilda Roche [place], DO 11/01/22  4:23 PM

## 2022-11-06 ENCOUNTER — Other Ambulatory Visit: Payer: Self-pay | Admitting: Cardiology

## 2022-11-06 ENCOUNTER — Encounter: Payer: Self-pay | Admitting: Cardiology

## 2022-11-06 ENCOUNTER — Ambulatory Visit: Payer: BC Managed Care – PPO | Attending: Cardiology | Admitting: Cardiology

## 2022-11-06 VITALS — BP 126/88 | HR 65 | Ht 69.0 in | Wt 257.4 lb

## 2022-11-06 DIAGNOSIS — I480 Paroxysmal atrial fibrillation: Secondary | ICD-10-CM

## 2022-11-06 DIAGNOSIS — Z79899 Other long term (current) drug therapy: Secondary | ICD-10-CM | POA: Diagnosis not present

## 2022-11-06 DIAGNOSIS — G4733 Obstructive sleep apnea (adult) (pediatric): Secondary | ICD-10-CM

## 2022-11-06 DIAGNOSIS — I10 Essential (primary) hypertension: Secondary | ICD-10-CM | POA: Diagnosis not present

## 2022-11-06 NOTE — Addendum Note (Signed)
Addended by: Reynolds Bowl on: 11/06/2022 10:28 AM   Modules accepted: Orders

## 2022-11-06 NOTE — Progress Notes (Signed)
Cardiology Office Note:    Date:  11/06/2022   ID:  Devin [name], DOB 12-17-1970, MRN [id number]  PCP:  Sharlene Dory, DO  Cardiologist:  Thomasene Ripple, DO  Electrophysiologist:  None   Referring MD: Sharlene Dory*   " I am doing well"   History of Present Illness:    Devin [name] is a 52 y.o. male with a hx of paroxysmal atrial fibrillation no indication yet for anticoagulation has been on propanolol which also help with his essential tremors, obstructive sleep apnea waiting for his CPAP due to shortage, hypertension, obesity.   I saw the patient on February 02, 2021 at that time his CPAP was still pending.  He was hypertensive but started patient on low-dose losartan 25 mg daily, he had been experiencing atrial fibrillation with rapid ventricular rate during that visit he was in sinus rhythm so I started the patient on flecainide 50 mg twice a day.  At his last visit he was doing well from a cardiovascular standpoint.   Past Medical History:  Diagnosis Date   Allergic rhinitis    Anemia    not on meds at this time (06/15/2022)   Anxiety    on meds   Atrial fibrillation (HCC)    Constipation    Depression    on meds   GERD (gastroesophageal reflux disease)    hx of -random epidodes   History of MRSA infection    Hypertension    controlled   Keratoconus of both eyes    Seasonal allergies    Sleep apnea    not using CPAP at this time (06/15/2022   Tremor     Past Surgical History:  Procedure Laterality Date   NECK SURGERY  02/03/14    Current Medications: Current Meds  Medication Sig   EPINEPHrine 0.3 mg/0.3 mL IJ SOAJ injection Inject 0.3 mg into the muscle as needed for anaphylaxis. As needed for life-threatening allergic reactions   flecainide (TAMBOCOR) 50 MG tablet TAKE ONE TABLET BY MOUTH TWICE DAILY (ROUND WHITE TABLET WITH YH 777 OR CC 11)   Loratadine (CLARITIN PO) Take 1 tablet by mouth daily at 6 (six) AM.   losartan  (COZAAR) 25 MG tablet TAKE ONE TABLET BY MOUTH EVERY DAY (OVAL WHITE TABLET WITH 5 I)   meloxicam (MOBIC) 15 MG tablet TAKE ONE TABLET BY MOUTH EVERY DAY AS NEEDED. (OBLONG YELLOW TABLET WITH UL 15)   montelukast (SINGULAIR) 10 MG tablet Take 1 tablet (10 mg total) by mouth at bedtime.   Olopatadine-Mometasone (RYALTRIS) 665-25 MCG/ACT SUSP 2 sprays each nostril daily   propranolol (INDERAL) 10 MG tablet TAKE ONE TABLET BY MOUTH THREE TIMES DAILY. (ROUND ORANGE TABLET)   tirzepatide (ZEPBOUND) 10 MG/0.5ML Pen Inject 10 mg into the skin once a week for 28 days.   venlafaxine XR (EFFEXOR-XR) 37.5 MG 24 hr capsule Take 1 capsule (37.5 mg total) by mouth daily with breakfast. (PINK CAPSULE)     Allergies:   Shellfish allergy and Penicillins   Social History   Socioeconomic History   Marital status: Married    Spouse name: Devin [name]   Number of children: 3   Years of education: Not on file   Highest education level: Not on file  Occupational History   Occupation: Owner    Employer: [employer]  Tobacco Use   Smoking status: Never   Smokeless tobacco: Never  Vaping Use   Vaping status: Never Used  Substance and Sexual Activity  Alcohol use: Not Currently    Alcohol/week: 0.0 - 5.0 standard drinks of alcohol    Comment: Rarely   Drug use: Never   Sexual activity: Not on file  Other Topics Concern   Not on file  Social History Narrative   Regular exercise: yes (runs and uses elliptical machine)Quit school in the 8th grade.   One story    Right Handed    Drinks caffeine   Social Determinants of Health   Financial Resource Strain: Not on file  Food Insecurity: Not on file  Transportation Needs: Not on file  Physical Activity: Not on file  Stress: Not on file  Social Connections: Not on file     Family History: The patient's family history includes ADD / ADHD in his daughter; Alcoholism in his father and mother; Asthma in his daughter; Brain cancer in his brother;  Cancer in his mother; Colon polyps in his father and sister; Diabetes in his mother; Drug abuse in his father; Heart disease in his mother; Hyperlipidemia in his mother; Hypertension in his father; Obesity in his father; Thyroid disease in his mother. There is no history of Colon cancer, Esophageal cancer, Stomach cancer, or Rectal cancer.  ROS:   Review of Systems  Constitution: Negative for decreased appetite, fever and weight gain.  HENT: Negative for congestion, ear discharge, hoarse voice and sore throat.   Eyes: Negative for discharge, redness, vision loss in right eye and visual halos.  Cardiovascular: Negative for chest pain, dyspnea on exertion, leg swelling, orthopnea and palpitations.  Respiratory: Negative for cough, hemoptysis, shortness of breath and snoring.   Endocrine: Negative for heat intolerance and polyphagia.  Hematologic/Lymphatic: Negative for bleeding problem. Does not bruise/bleed easily.  Skin: Negative for flushing, nail changes, rash and suspicious lesions.  Musculoskeletal: Negative for arthritis, joint pain, muscle cramps, myalgias, neck pain and stiffness.  Gastrointestinal: Negative for abdominal pain, bowel incontinence, diarrhea and excessive appetite.  Genitourinary: Negative for decreased libido, genital sores and incomplete emptying.  Neurological: Negative for brief paralysis, focal weakness, headaches and loss of balance.  Psychiatric/Behavioral: Negative for altered mental status, depression and suicidal ideas.  Allergic/Immunologic: Negative for HIV exposure and persistent infections.    EKGs/Labs/Other Studies Reviewed:    The following studies were reviewed today:   EKG: Sinus rhythm, heart rate 68 bpm with sinus arrhythmia  Zio monitor 08/2020 Sinus rhythm Nocturnal sinus bradycardia Rare premature ventricular contractions No afib No sustained arrhythmias   TTE 08/12/2019 IMPRESSIONS   1. Left ventricular ejection fraction, by estimation,  is 60 to 65%. The  left ventricle has normal function. The left ventricle has no regional  wall motion abnormalities. Left ventricular diastolic parameters were  normal.   2. Right ventricular systolic function is normal. The right ventricular  size is normal. There is normal pulmonary artery systolic pressure. The  estimated right ventricular systolic pressure is 21.3 mmHg.   3. The mitral valve is normal in structure. No evidence of mitral valve  regurgitation.   4. The aortic valve is tricuspid. Aortic valve regurgitation is not  visualized. No aortic stenosis is present.   5. The inferior vena cava is normal in size with <50% respiratory  variability, suggesting right atrial pressure of 8 mmHg.   FINDINGS   Left Ventricle: Left ventricular ejection fraction, by estimation, is 60  to 65%. The left ventricle has normal function. The left ventricle has no  regional wall motion abnormalities. The left ventricular internal cavity  size was normal in  size. There is   no left ventricular hypertrophy. Left ventricular diastolic parameters  were normal.   Right Ventricle: The right ventricular size is normal. No increase in  right ventricular wall thickness. Right ventricular systolic function is  normal. There is normal pulmonary artery systolic pressure. The tricuspid  regurgitant velocity is 2.14 m/s, and   with an assumed right atrial pressure of 3 mmHg, the estimated right  ventricular systolic pressure is 21.3 mmHg.   Left Atrium: Left atrial size was normal in size.   Right Atrium: Right atrial size was normal in size.   Pericardium: There is no evidence of pericardial effusion.   Mitral Valve: The mitral valve is normal in structure. No evidence of  mitral valve regurgitation.   Tricuspid Valve: The tricuspid valve is normal in structure. Tricuspid  valve regurgitation is trivial.   Aortic Valve: The aortic valve is tricuspid. Aortic valve regurgitation is  not  visualized. No aortic stenosis is present.   Pulmonic Valve: The pulmonic valve was not well visualized. Pulmonic valve  regurgitation is not visualized.   Aorta: The aortic root is normal in size and structure.   Venous: The inferior vena cava is normal in size with less than 50%  respiratory variability, suggesting right atrial pressure of 8 mmHg.   IAS/Shunts: The interatrial septum was not well visualized.    Recent Labs: 04/25/2022: ALT 25; BUN 17; Creatinine, Ser 0.90; Potassium 4.4; Sodium 139 06/13/2022: Hemoglobin 13.2; Platelets 317.0  Recent Lipid Panel    Component Value Date/Time   CHOL 188 04/25/2022 0753   CHOL 168 06/10/2020 1415   TRIG 115.0 04/25/2022 0753   HDL 53.10 04/25/2022 0753   HDL 58 06/10/2020 1415   CHOLHDL 4 04/25/2022 0753   VLDL 23.0 04/25/2022 0753   LDLCALC 112 (H) 04/25/2022 0753   LDLCALC 93 06/10/2020 1415    Physical Exam:    VS:  BP 126/88 (BP Location: Left Arm, Patient Position: Sitting, Cuff Size: Normal)   Pulse 65   Ht 5\' 9"  (1.753 m)   Wt 257 lb 6.4 oz (116.8 kg)   SpO2 99%   BMI 38.01 kg/m     Wt Readings from Last 3 Encounters:  11/06/22 257 lb 6.4 oz (116.8 kg)  11/01/22 259 lb 6 oz (117.7 kg)  06/21/22 274 lb (124.3 kg)     GEN: Well nourished, well developed in no acute distress HEENT: Normal NECK: No JVD; No carotid bruits LYMPHATICS: No lymphadenopathy CARDIAC: S1S2 noted,RRR, no murmurs, rubs, gallops RESPIRATORY:  Clear to auscultation without rales, wheezing or rhonchi  ABDOMEN: Soft, non-tender, non-distended, +bowel sounds, no guarding. EXTREMITIES: No edema, No cyanosis, no clubbing MUSCULOSKELETAL:  No deformity  SKIN: Warm and dry NEUROLOGIC:  Alert and oriented x 3, non-focal PSYCHIATRIC:  Normal affect, good insight  ASSESSMENT:    1. PAF (paroxysmal atrial fibrillation) (HCC)   2. Primary hypertension   3. OSA (obstructive sleep apnea)    PLAN:    He appears to be doing well from a  cardiovascular standpoint.  He offers no complaints at this time.  He is happy with the new medication regimen that he is on.  Paroxysmal atrial fibrillation -continue flecainide milligram twice daily he is also on propanolol.  Anticoagulation not indicated at this time CHA2DS2-VASc 1  Hypertension-blood pressure acceptable continue patient his current regimen.  The patient is in agreement with the above plan. The patient left the office in stable condition.  The patient will follow up  in 1 year or sooner if needed.   Medication Adjustments/Labs and Tests Ordered: Current medicines are reviewed at length with the patient today.  Concerns regarding medicines are outlined above.  Orders Placed This Encounter  Procedures   EKG 12-Lead   No orders of the defined types were placed in this encounter.   There are no Patient Instructions on file for this visit.   Adopting a Healthy Lifestyle.  Know what a healthy weight is for you (roughly BMI <25) and aim to maintain this   Aim for 7+ servings of fruits and vegetables daily   65-80+ fluid ounces of water or unsweet tea for healthy kidneys   Limit to max 1 drink of alcohol per day; avoid smoking/tobacco   Limit animal fats in diet for cholesterol and heart health - choose grass fed whenever available   Avoid highly processed foods, and foods high in saturated/trans fats   Aim for low stress - take time to unwind and care for your mental health   Aim for 150 min of moderate intensity exercise weekly for heart health, and weights twice weekly for bone health   Aim for 7-9 hours of sleep daily   When it comes to diets, agreement about the perfect plan isnt easy to find, even among the experts. Experts at the [facility] [employer] developed an idea known as the Healthy Eating Plate. Just imagine a plate divided into logical, healthy portions.   The emphasis is on diet quality:   Load up on vegetables and fruits - one-half  of your plate: Aim for color and variety, and remember that potatoes dont count.   Go for whole grains - one-quarter of your plate: Whole wheat, barley, wheat berries, quinoa, oats, brown rice, and foods made with them. If you want pasta, go with whole wheat pasta.   Protein power - one-quarter of your plate: Fish, chicken, beans, and nuts are all healthy, versatile protein sources. Limit red meat.   The diet, however, does go beyond the plate, offering a few other suggestions.   Use healthy plant oils, such as olive, canola, soy, corn, sunflower and peanut. Check the labels, and avoid partially hydrogenated oil, which have unhealthy trans fats.   If youre thirsty, drink water. Coffee and tea are good in moderation, but skip sugary drinks and limit milk and dairy products to one or two daily servings.   The type of carbohydrate in the diet is more important than the amount. Some sources of carbohydrates, such as vegetables, fruits, whole grains, and beans-are healthier than others.   Finally, stay active  Signed, Thomasene Ripple, DO  11/06/2022 10:18 AM    [facility]

## 2022-11-06 NOTE — Patient Instructions (Signed)
Medication Instructions:  Your physician recommends that you continue on your current medications as directed. Please refer to the Current Medication list given to you today.  *If you need a refill on your cardiac medications before your next appointment, please call your pharmacy*   Lab Work: Your physician recommends that you have labs drawn today: Flecainide  If you have labs (blood work) drawn today and your tests are completely normal, you will receive your results only by: MyChart Message (if you have MyChart) OR A paper copy in the mail If you have any lab test that is abnormal or we need to change your treatment, we will call you to review the results.   Testing/Procedures: None   Follow-Up: At [facility], you and your health needs are our priority.  As part of our continuing mission to provide you with exceptional heart care, we have created designated Provider Care Teams.  These Care Teams include your primary Cardiologist (physician) and Advanced Practice Providers (APPs -  Physician Assistants and Nurse Practitioners) who all work together to provide you with the care you need, when you need it.   Your next appointment:   1 year(s)  Provider:   Thomasene Ripple, DO

## 2022-11-17 ENCOUNTER — Other Ambulatory Visit: Payer: Self-pay | Admitting: Cardiology

## 2022-11-21 LAB — FLECAINIDE LEVEL: Flecainide: 0.13 ug/mL — ABNORMAL LOW (ref 0.20–1.00)

## 2022-11-27 ENCOUNTER — Other Ambulatory Visit: Payer: Self-pay | Admitting: Cardiology

## 2022-12-05 ENCOUNTER — Other Ambulatory Visit (HOSPITAL_BASED_OUTPATIENT_CLINIC_OR_DEPARTMENT_OTHER): Payer: Self-pay

## 2022-12-15 ENCOUNTER — Ambulatory Visit: Payer: BC Managed Care – PPO | Admitting: Family Medicine

## 2022-12-19 ENCOUNTER — Ambulatory Visit: Payer: BC Managed Care – PPO | Admitting: Family Medicine

## 2022-12-19 ENCOUNTER — Encounter: Payer: Self-pay | Admitting: Family Medicine

## 2022-12-19 VITALS — BP 126/76 | HR 67 | Temp 97.9°F | Resp 16 | Ht 69.0 in | Wt 250.4 lb

## 2022-12-19 DIAGNOSIS — E669 Obesity, unspecified: Secondary | ICD-10-CM | POA: Diagnosis not present

## 2022-12-19 DIAGNOSIS — Z6836 Body mass index (BMI) 36.0-36.9, adult: Secondary | ICD-10-CM

## 2022-12-19 NOTE — Progress Notes (Signed)
Chief Complaint  Patient presents with   Follow-up    Follow up    Subjective: Patient is a 52 y.o. male here for f/u.  Taking Zepbound 12.5 mg/week. Doing well, compliant, no AE's. Cravings and appetite well controlled. Feeling better and feels that his higher dosages are indeed more potent. Diet healthy. He is not exercise routinely. Has lost 43 lbs since starting. No CP or SOB.   Past Medical History:  Diagnosis Date   Allergic rhinitis    Anemia    not on meds at this time (06/15/2022)   Anxiety    on meds   Atrial fibrillation (HCC)    Constipation    Depression    on meds   GERD (gastroesophageal reflux disease)    hx of -random epidodes   History of MRSA infection    Hypertension    controlled   Keratoconus of both eyes    Seasonal allergies    Sleep apnea    not using CPAP at this time (06/15/2022   Tremor     Objective: BP 126/76 (BP Location: Left Arm, Patient Position: Sitting, Cuff Size: Normal)   Pulse 67   Temp 97.9 F (36.6 C) (Oral)   Resp 16   Ht 5\' 9"  (1.753 m)   Wt 250 lb 6.4 oz (113.6 kg)   SpO2 98%   BMI 36.98 kg/m  General: Awake, appears stated age Heart: RRR, no LE edema Lungs: CTAB, no rales, wheezes or rhonchi. No accessory muscle use Psych: Age appropriate judgment and insight, normal affect and mood  Assessment and Plan: Obesity (BMI 30-39.9)  Chronic, improved. Cont Zepbound 12.5 mg/week and increase to 15 mg/week in a couple weeks. Counseled on diet/exercise. F/u in 6 mo for CPE or prn.  The patient voiced understanding and agreement to the plan.  Jilda Roche [place], DO 12/19/22  4:15 PM

## 2022-12-19 NOTE — Patient Instructions (Addendum)
Very strong work with your weight loss.   Keep the diet clean and stay active.  Aim to do some physical exertion for 150 minutes per week. This is typically divided into 5 days per week, 30 minutes per day. The activity should be enough to get your heart rate up. Anything is better than nothing if you have time constraints.  Please consider adding some weight resistance exercise to your routine. Consider yoga as well.   When you need a refill, send me a message and include whether you want a 12 week supply or a 4 week supply.   Let us know if you need anything.

## 2023-01-04 ENCOUNTER — Other Ambulatory Visit (HOSPITAL_BASED_OUTPATIENT_CLINIC_OR_DEPARTMENT_OTHER): Payer: Self-pay

## 2023-01-04 ENCOUNTER — Telehealth: Payer: Self-pay

## 2023-01-04 ENCOUNTER — Encounter: Payer: Self-pay | Admitting: Family Medicine

## 2023-01-04 NOTE — Telephone Encounter (Signed)
PA initiated via Covermymeds; KEY: [id number]. Awaiting determination.

## 2023-01-05 ENCOUNTER — Other Ambulatory Visit (HOSPITAL_COMMUNITY): Payer: Self-pay

## 2023-01-05 ENCOUNTER — Other Ambulatory Visit (HOSPITAL_BASED_OUTPATIENT_CLINIC_OR_DEPARTMENT_OTHER): Payer: Self-pay

## 2023-01-08 ENCOUNTER — Other Ambulatory Visit: Payer: Self-pay | Admitting: Family Medicine

## 2023-01-08 ENCOUNTER — Other Ambulatory Visit (HOSPITAL_BASED_OUTPATIENT_CLINIC_OR_DEPARTMENT_OTHER): Payer: Self-pay

## 2023-01-08 NOTE — Telephone Encounter (Signed)
PA approved.   Approved. Authorization Expiration Date: 01/04/2024

## 2023-01-10 ENCOUNTER — Other Ambulatory Visit (HOSPITAL_BASED_OUTPATIENT_CLINIC_OR_DEPARTMENT_OTHER): Payer: Self-pay

## 2023-02-16 ENCOUNTER — Other Ambulatory Visit: Payer: Self-pay | Admitting: Cardiology

## 2023-02-19 ENCOUNTER — Other Ambulatory Visit: Payer: Self-pay | Admitting: Cardiology

## 2023-03-24 IMAGING — CT CT HEART MORP W/ CTA COR W/ SCORE W/ CA W/CM &/OR W/O CM
4 of 7 series · 8 of 20 positions shown, 9 images · non-contrast
Comparison: None.
COMPARISON: None.

Addendum:
EXAM:
OVER-READ INTERPRETATION  CT CHEST

The following report is an over-read performed by radiologist Dr.
Sanittey Pasanna [[email or web address]] on 09/30/2020. This
over-read does not include interpretation of cardiac or coronary
anatomy or pathology. The coronary calcium score/coronary CTA
interpretation by the cardiologist is attached.
CLINICAL DATA: This is a 49 year old male with chest pain.
Cardiac/Coronary  CTA
TECHNIQUE: The patient was scanned on a [employer] Force scanner.

[Series 6: best diast 75 % · axial · 0.39mm/px · z∈[-304,-266]mm · 2 of 286 slices shown]
[im 96/286  vessel]
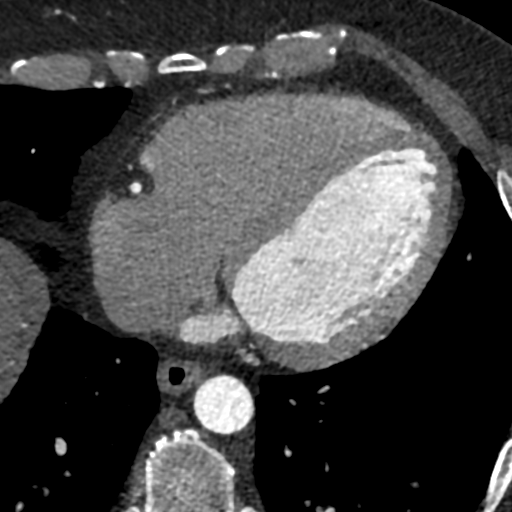
[im 191/286  vessel]
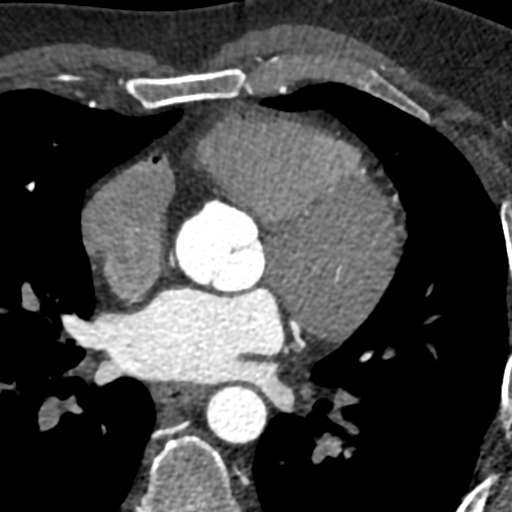

[Series 7: best syst · axial · 0.39mm/px · z∈[-304,-266]mm · 2 of 286 slices shown, 3 images]
[im 96/286  vessel]
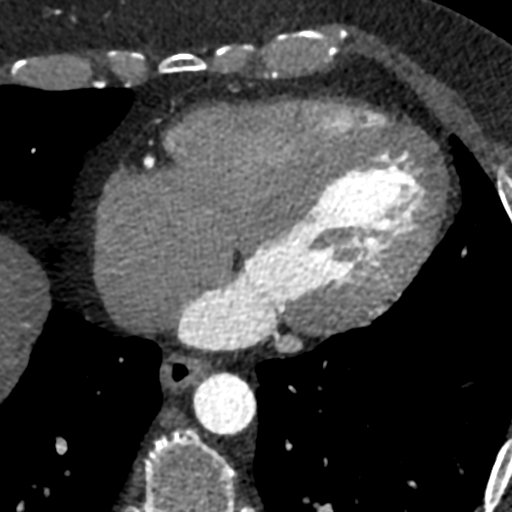
[im 96/286  lung]
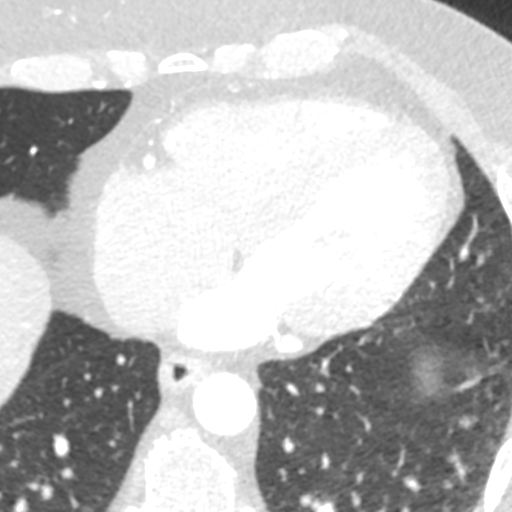
[im 191/286  vessel]
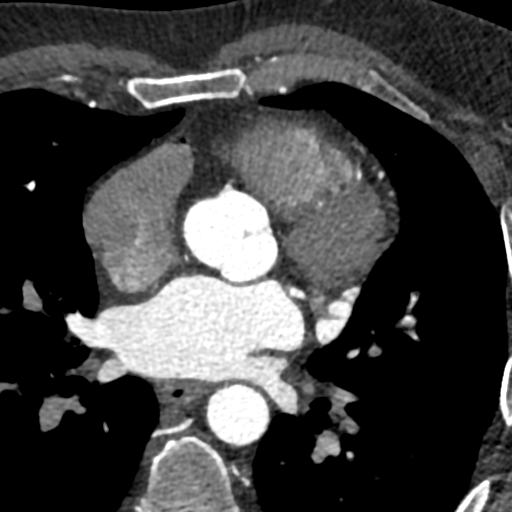

[Series 8: ts diast sharp 75 % · axial · 0.39mm/px · z∈[-304,-266]mm · 2 of 286 slices shown]
[im 96/286  lung]
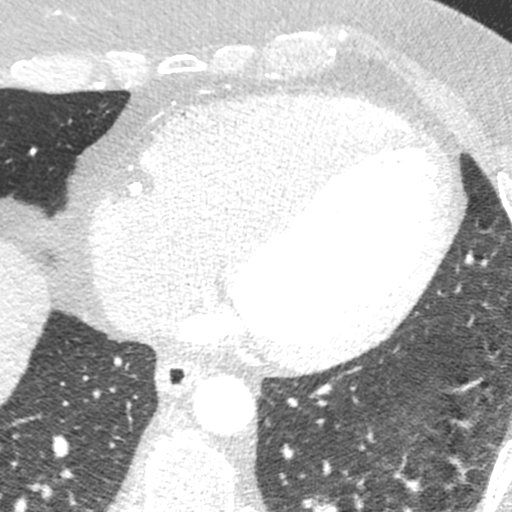
[im 191/286  lung]
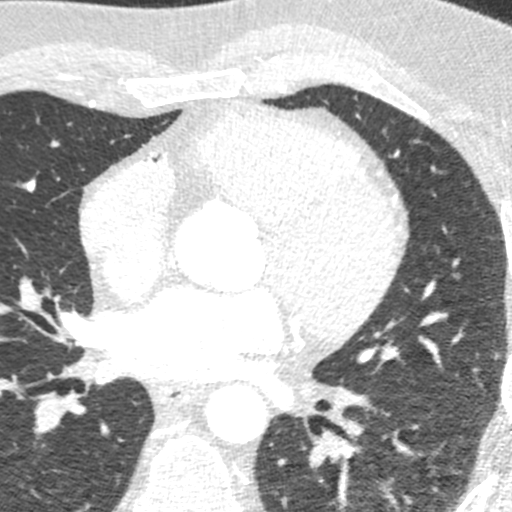

[Series 9: ts syst sharp · axial · 0.39mm/px · z∈[-304,-266]mm · 2 of 286 slices shown]
[im 96/286  lung]
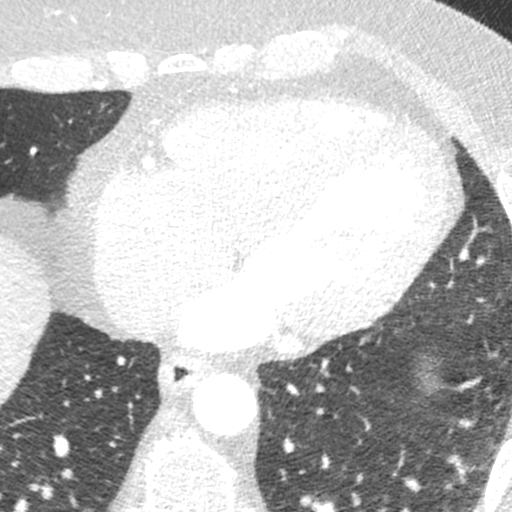
[im 191/286  lung]
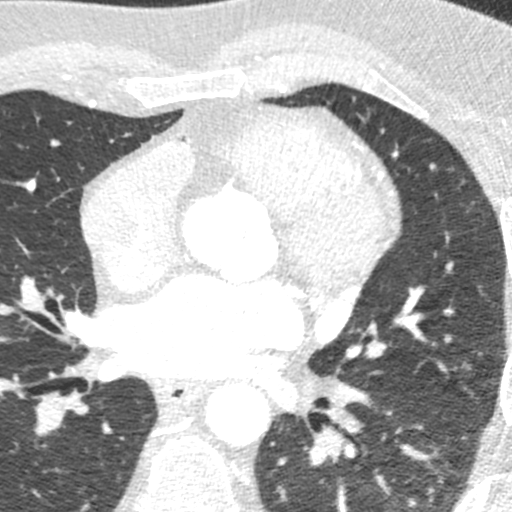

[8 of 20 positions shown; findings below may reference images not displayed]

FINDINGS: Within the visualized portions of the thorax there are no suspicious
appearing pulmonary nodules or masses, there is no acute
consolidative airspace disease, no pleural effusions, no
pneumothorax and no lymphadenopathy. Visualized portions of the
upper abdomen are unremarkable. There are no aggressive appearing
lytic or blastic lesions noted in the visualized portions of the
skeleton.
IMPRESSION: No significant incidental noncardiac findings are noted.
FINDINGS: A 100 kV prospective scan was triggered in the descending thoracic
aorta at 111 HU's. Axial non-contrast 3 mm slices were carried out
through the heart. The data set was analyzed on a dedicated work
station and scored using the Agatson method. Gantry rotation speed
was 250 msecs and collimation was .6 mm. No beta blockade and 0.8 mg
of sl NTG was given. The 3D data set was reconstructed in 5%
intervals of the 67-82 % of the R-R cycle. Diastolic phases were
analyzed on a dedicated work station using MPR, MIP and VRT modes.
The patient received 80 cc of contrast.

Aorta:  Normal size.  No calcifications.  No dissection.

Aortic Valve:  Trileaflet.  No calcifications.

Coronary Arteries:  Normal coronary origin.  Right dominance.

RCA is a large dominant artery that gives rise to PDA and PLA. There
is no plaque.

Left main is a large artery that gives rise to LAD and LCX arteries.

LAD is a large vessel that has no plaque.

LCX is a non-dominant artery that gives rise to one large OM1
branch. There is no plaque.

Other findings:

Normal pulmonary vein drainage into the left atrium.

Normal left atrial appendage without a thrombus.

Normal size of the pulmonary artery.
IMPRESSION: 1. Coronary calcium score of 0. This was 0 percentile for age and
sex matched control.

2. Normal coronary origin with right dominance.

3. No evidence of CAD. CAD-RADS 0. No evidence of CAD (0%). Consider
non-atherosclerotic causes of chest pain.

*** End of Addendum ***
EXAM:
OVER-READ INTERPRETATION  CT CHEST

The following report is an over-read performed by radiologist Dr.
Sanittey Pasanna [REDACTED] on 09/30/2020. This
over-read does not include interpretation of cardiac or coronary
anatomy or pathology. The coronary calcium score/coronary CTA
interpretation by the cardiologist is attached.
FINDINGS: Within the visualized portions of the thorax there are no suspicious
appearing pulmonary nodules or masses, there is no acute
consolidative airspace disease, no pleural effusions, no
pneumothorax and no lymphadenopathy. Visualized portions of the
upper abdomen are unremarkable. There are no aggressive appearing
lytic or blastic lesions noted in the visualized portions of the
skeleton.
IMPRESSION: No significant incidental noncardiac findings are noted.

## 2023-03-26 ENCOUNTER — Other Ambulatory Visit: Payer: Self-pay | Admitting: Family Medicine

## 2023-04-18 ENCOUNTER — Telehealth (INDEPENDENT_AMBULATORY_CARE_PROVIDER_SITE_OTHER): Payer: BC Managed Care – PPO | Admitting: Nurse Practitioner

## 2023-04-18 ENCOUNTER — Telehealth: Payer: BC Managed Care – PPO

## 2023-04-18 ENCOUNTER — Encounter: Payer: Self-pay | Admitting: Nurse Practitioner

## 2023-04-18 DIAGNOSIS — R6889 Other general symptoms and signs: Secondary | ICD-10-CM | POA: Diagnosis not present

## 2023-04-18 NOTE — Progress Notes (Signed)
 [facility] [address] [place] [address] Dept: [phone] Dept Fax: [phone]  Virtual Video Visit  I connected with Devin [name] on 04/18/23 at  1:20 PM EST by a video enabled telemedicine application and verified that I am speaking with the correct person using two identifiers.  Location patient: Home Location provider: Clinic Persons participating in the virtual visit: Patient; Rodman Pickle, NP; Jodelle Green, CMA  I discussed the limitations of evaluation and management by telemedicine and the availability of in person appointments. The patient expressed understanding and agreed to proceed.  Chief Complaint  Patient presents with   Acute Visit    Pt C/O of vomiting that began Saturday with no abdominal pain present; He also is experiencing cough, fatigue and congestion for 4 days with no fever, yellow and brownish phlegm. Pt took Sudafed for symptom; shingles vaccine is also due       SUBJECTIVE:  HPI: Devin [name] is a 53 y.o. male who presents with vomiting, cough, fatigue, and congestion for 4 days.   UPPER RESPIRATORY TRACT INFECTION  Fever: no Cough: yes - productive Shortness of breath: no Wheezing: no Chest pain: no Chest tightness: no Chest congestion: no Nasal congestion: yes Runny nose: no Post nasal drip: yes Sneezing: no Sore throat: yes Swollen glands: no Sinus pressure: no Headache: no Face pain: no Toothache: no Ear pain: no bilateral Ear pressure: no bilateral Eyes red/itching:no Eye drainage/crusting: no  Vomiting: yes once on Monday Rash: no Fatigue: yes Sick contacts: yes - dad had flu and pneumonia Context: stable Recurrent sinusitis: no Relief with OTC cold/cough medications: no  Treatments attempted: sudafed    Patient Active Problem List   Diagnosis Date Noted   Chronic sinusitis 01/18/2022   Costochondritis 01/18/2022   Depression, recurrent (HCC)  03/16/2021   GAD (generalized anxiety disorder) 03/16/2021   Fatigue 03/16/2021   Medication management 02/02/2021   PAF (paroxysmal atrial fibrillation) (HCC) 12/28/2020   Sinus bradycardia 12/28/2020   OSA (obstructive sleep apnea) 12/28/2020   Obesity (BMI 30-39.9) 12/28/2020   Constipation 09/09/2020   GERD (gastroesophageal reflux disease) 09/09/2020   History of MRSA infection 09/09/2020   Keratoconus of both eyes 09/09/2020   Paroxysmal atrial fibrillation (HCC) 08/17/2020   Insomnia 05/22/2019   Atypical chest pain 12/27/2014   Obesity (BMI 30.0-34.9) 09/18/2014   Bronchitis, subacute 04/14/2014   Neck pain 02/24/2014   Resting tremor 10/29/2013   Weight gain 12/21/2011   Chronic pain of left knee 06/26/2011   Depression with anxiety 04/29/2010   Keratoconus 04/29/2010   CARDIOMEGALY 03/25/2010   HTN (hypertension) 12/31/2006   HEART MURMUR, HX OF 12/31/2006    Past Surgical History:  Procedure Laterality Date   NECK SURGERY  02/03/14    Family History  Problem Relation Age of Onset   Diabetes Mother    Hyperlipidemia Mother    Heart disease Mother        CHF   Thyroid disease Mother        Graves Disease   Cancer Mother    Alcoholism Mother    Colon polyps Father    Hypertension Father    Alcoholism Father    Drug abuse Father    Obesity Father    Colon polyps Sister    Brain cancer Brother        GBM   Asthma Daughter    ADD / ADHD Daughter    Colon cancer Neg Hx  Esophageal cancer Neg Hx    Stomach cancer Neg Hx    Rectal cancer Neg Hx     Social History   Tobacco Use   Smoking status: Never   Smokeless tobacco: Never  Vaping Use   Vaping status: Never Used  Substance Use Topics   Alcohol use: Not Currently    Alcohol/week: 0.0 - 5.0 standard drinks of alcohol    Comment: Rarely   Drug use: Never     Current Outpatient Medications:    EPINEPHrine 0.3 mg/0.3 mL IJ SOAJ injection, Inject 0.3 mg into the muscle as needed for  anaphylaxis. As needed for life-threatening allergic reactions, Disp: 2 each, Rfl: 1   flecainide (TAMBOCOR) 50 MG tablet, TAKE ONE TABLET BY MOUTH TWICE DAILY (ROUND WHITE TABLET WITH BPL 025), Disp: 60 tablet, Rfl: 4   Loratadine (CLARITIN PO), Take 1 tablet by mouth daily at 6 (six) AM., Disp: , Rfl:    losartan (COZAAR) 25 MG tablet, TAKE ONE TABLET BY MOUTH EVERY DAY (OVAL WHITE TABLET WITH 5 I), Disp: 90 tablet, Rfl: 2   meloxicam (MOBIC) 15 MG tablet, TAKE ONE TABLET BY MOUTH EVERY DAY AS NEEDED. (OBLONG YELLOW TABLET WITH UL 15), Disp: 90 tablet, Rfl: 0   montelukast (SINGULAIR) 10 MG tablet, Take 1 tablet (10 mg total) by mouth at bedtime., Disp: 30 tablet, Rfl: 5   Olopatadine-Mometasone (RYALTRIS) 665-25 MCG/ACT SUSP, 2 sprays each nostril daily, Disp: 29 g, Rfl: 5   propranolol (INDERAL) 10 MG tablet, TAKE ONE TABLET BY MOUTH THREE TIMES DAILY. (ROUND ORANGE TABLET), Disp: 90 tablet, Rfl: 3   tirzepatide (ZEPBOUND) 15 MG/0.5ML Pen, Inject 15 mg into the skin once a week., Disp: 6 mL, Rfl: 2   venlafaxine XR (EFFEXOR-XR) 37.5 MG 24 hr capsule, TAKE ONE CAPSULE BY MOUTH EVERY DAY WITH BREAKFAST. (PINK CAPSULE OR GRAY AND PINK CAPSULE), Disp: 90 capsule, Rfl: 1   nitroGLYCERIN (NITROSTAT) 0.4 MG SL tablet, Place 1 tablet (0.4 mg total) under the tongue every 5 (five) minutes as needed for chest pain., Disp: 90 tablet, Rfl: 3  Allergies  Allergen Reactions   Shellfish Allergy Nausea And Vomiting    shrimp   Penicillins Swelling and Rash    Has patient had a PCN reaction causing immediate rash, facial/tongue/throat swelling, SOB or lightheadedness with hypotension: Yes Has patient had a PCN reaction causing severe rash involving mucus membranes or skin necrosis: No Has patient had a PCN reaction that required hospitalization: No Has patient had a PCN reaction occurring within the last 10 years: No If all of the above answers are "NO", then may proceed with Cephalosporin use. REACTION:  causes rash and sob.    ROS: See pertinent positives and negatives per HPI.  OBSERVATIONS/OBJECTIVE:  VITALS per patient if applicable: There were no vitals filed for this visit. There is no height or weight on file to calculate BMI.    GENERAL: Alert and oriented. Appears well and in no acute distress.  HEENT: Atraumatic. Conjunctiva clear. No obvious abnormalities on inspection of external nose and ears.  NECK: Normal movements of the head and neck.  LUNGS: On inspection, no signs of respiratory distress. Breathing rate appears normal. No obvious gross SOB, gasping or wheezing, and no conversational dyspnea.  CV: No obvious cyanosis.  MS: Moves all visible extremities without noticeable abnormality.  PSYCH/NEURO: Pleasant and cooperative. No obvious depression or anxiety. Speech and thought processing grossly intact.  ASSESSMENT AND PLAN:  Problem List Items Addressed This Visit  None Visit Diagnoses       Flu-like symptoms    -  Primary   Exposed, outside window for treatment. Symptom management, encourage fluids, rest. Mucinex BID prn cough/phlegm. F/U if not improving.        I discussed the assessment and treatment plan with the patient. The patient was provided an opportunity to ask questions and all were answered. The patient agreed with the plan and demonstrated an understanding of the instructions.   The patient was advised to call back or seek an in-person evaluation if the symptoms worsen or if the condition fails to improve as anticipated.   Gerre Scull, NP

## 2023-04-18 NOTE — Patient Instructions (Signed)
 It was great to see you!  Keep drinking plenty of fluids and getting rest  You can start mucinex twice a day to help with phlegm and cough  Let's follow-up if your symptoms worsen or don't improve.   Take care,  Rodman Pickle, NP

## 2023-06-19 ENCOUNTER — Encounter: Payer: BC Managed Care – PPO | Admitting: Family Medicine

## 2023-06-27 ENCOUNTER — Other Ambulatory Visit: Payer: Self-pay | Admitting: Cardiology

## 2023-07-04 ENCOUNTER — Encounter: Payer: Self-pay | Admitting: Family Medicine

## 2023-07-04 ENCOUNTER — Ambulatory Visit (INDEPENDENT_AMBULATORY_CARE_PROVIDER_SITE_OTHER): Admitting: Family Medicine

## 2023-07-04 VITALS — BP 124/72 | HR 65 | Temp 98.0°F | Resp 16 | Ht 69.0 in | Wt 219.0 lb

## 2023-07-04 DIAGNOSIS — Z0000 Encounter for general adult medical examination without abnormal findings: Secondary | ICD-10-CM

## 2023-07-04 NOTE — Progress Notes (Signed)
 Chief Complaint  Patient presents with   Annual Exam    CPE    Well Male Devin [name] is here for a complete physical.   His last physical was >1 year ago.  Current diet: in general, a "healthy" diet.  Current exercise: none Weight trend: Intentionally losing Fatigue out of ordinary? No. Seat belt? Yes.   Advanced directive? No  Health maintenance Shingrix- No Colonoscopy- Yes Tetanus- Yes HIV- Yes Hep C- Yes   Past Medical History:  Diagnosis Date   Allergic rhinitis    Anemia    not on meds at this time (06/15/2022)   Anxiety    on meds   Atrial fibrillation (HCC)    Constipation    Depression    on meds   GERD (gastroesophageal reflux disease)    hx of -random epidodes   History of MRSA infection    Hypertension    controlled   Keratoconus of both eyes    Seasonal allergies    Sleep apnea    not using CPAP at this time (06/15/2022   Tremor       Past Surgical History:  Procedure Laterality Date   NECK SURGERY  02/03/14    Medications  Current Outpatient Medications on File Prior to Visit  Medication Sig Dispense Refill   EPINEPHrine  0.3 mg/0.3 mL IJ SOAJ injection Inject 0.3 mg into the muscle as needed for anaphylaxis. As needed for life-threatening allergic reactions 2 each 1   flecainide  (TAMBOCOR ) 50 MG tablet TAKE ONE TABLET BY MOUTH TWICE DAILY (ROUND WHITE TABLET WITH cc 11 or BPL 025) 60 tablet 2   Loratadine (CLARITIN PO) Take 1 tablet by mouth daily at 6 (six) AM.     losartan  (COZAAR ) 25 MG tablet TAKE ONE TABLET BY MOUTH EVERY DAY (OVAL WHITE TABLET WITH 5 I) 90 tablet 2   meloxicam  (MOBIC ) 15 MG tablet TAKE ONE TABLET BY MOUTH EVERY DAY AS NEEDED. (OBLONG YELLOW TABLET WITH UL 15) 90 tablet 0   propranolol  (INDERAL ) 10 MG tablet TAKE ONE TABLET BY MOUTH THREE TIMES DAILY. (ROUND ORANGE TABLET) 90 tablet 3   tirzepatide  (ZEPBOUND ) 15 MG/0.5ML Pen Inject 15 mg into the skin once a week. 6 mL 2   venlafaxine  XR (EFFEXOR -XR) 37.5 MG 24 hr  capsule TAKE ONE CAPSULE BY MOUTH EVERY DAY WITH BREAKFAST. (PINK CAPSULE OR GRAY AND PINK CAPSULE) 90 capsule 1   nitroGLYCERIN  (NITROSTAT ) 0.4 MG SL tablet Place 1 tablet (0.4 mg total) under the tongue every 5 (five) minutes as needed for chest pain. 90 tablet 3    Allergies Allergies  Allergen Reactions   Shellfish Allergy  Nausea And Vomiting    shrimp   Penicillins Swelling and Rash    Has patient had a PCN reaction causing immediate rash, facial/tongue/throat swelling, SOB or lightheadedness with hypotension: Yes Has patient had a PCN reaction causing severe rash involving mucus membranes or skin necrosis: No Has patient had a PCN reaction that required hospitalization: No Has patient had a PCN reaction occurring within the last 10 years: No If all of the above answers are "NO", then may proceed with Cephalosporin use. REACTION: causes rash and sob.    Family History Family History  Problem Relation Age of Onset   Diabetes Mother    Hyperlipidemia Mother    Heart disease Mother        CHF   Thyroid  disease Mother        Graves Disease   Cancer Mother  Alcoholism Mother    Colon polyps Father    Hypertension Father    Alcoholism Father    Drug abuse Father    Obesity Father    Colon polyps Sister    Brain cancer Brother        GBM   Asthma Daughter    ADD / ADHD Daughter    Colon cancer Neg Hx    Esophageal cancer Neg Hx    Stomach cancer Neg Hx    Rectal cancer Neg Hx     Review of Systems: Constitutional:  no fevers Eye:  no recent significant change in vision Ear/Nose/Mouth/Throat:  Ears:  no hearing loss Nose/Mouth/Throat:  no complaints of nasal congestion, no sore throat Cardiovascular:  no chest pain Respiratory:  no shortness of breath Gastrointestinal:  no change in bowel habits GU:  Male: negative for dysuria, frequency Musculoskeletal/Extremities:  no joint pain Integumentary (Skin/Breast):  no abnormal skin lesions reported Neurologic:  no  headaches Endocrine: No unexpected weight changes Hematologic/Lymphatic:  no abnormal bleeding  Exam BP 124/72 (BP Location: Left Arm, Patient Position: Sitting)   Pulse 65   Temp 98 F (36.7 C) (Oral)   Resp 16   Ht 5\' 9"  (1.753 m)   Wt 219 lb (99.3 kg)   SpO2 96%   BMI 32.34 kg/m  General:  well developed, well nourished, in no apparent distress Skin:  no significant moles, warts, or growths Head:  no masses, lesions, or tenderness Eyes:  pupils equal and round, sclera anicteric without injection Ears:  canals without lesions, TMs shiny without retraction, no obvious effusion, no erythema Nose:  nares patent, mucosa normal Throat/Pharynx:  lips and gingiva without lesion; tongue and uvula midline; non-inflamed pharynx; no exudates or postnasal drainage Neck: neck supple without adenopathy, thyromegaly, or masses Cardiac: RRR, no bruits, no LE edema Lungs:  clear to auscultation, breath sounds equal bilaterally, no respiratory distress Abdomen: BS+, soft, non-tender, non-distended, no masses or organomegaly noted Rectal: Deferred Musculoskeletal:  symmetrical muscle groups noted without atrophy or deformity Neuro:  gait normal; deep tendon reflexes normal and symmetric Psych: well oriented with normal range of affect and appropriate judgment/insight  Assessment and Plan  Well adult exam - Plan: CBC, Comprehensive metabolic panel with GFR, Lipid panel   Well 53 y.o. male. Counseled on diet and exercise. He has lost a tremendous amount of weight and is doing much better. Advanced directive form provided today.  Shingrix rec'd.  Counseled on risks and benefits of prostate cancer screening with PSA. The patient agrees to forego testing. Immunizations, labs, and further orders as above. Follow up in 6 mo. The patient voiced understanding and agreement to the plan.  Shellie Dials Conway, DO 07/04/23 3:29 PM

## 2023-07-04 NOTE — Patient Instructions (Addendum)

## 2023-07-05 ENCOUNTER — Ambulatory Visit: Payer: Self-pay | Admitting: Family Medicine

## 2023-07-05 LAB — CBC
HCT: 37.9 % — ABNORMAL LOW (ref 39.0–52.0)
Hemoglobin: 13 g/dL (ref 13.0–17.0)
MCHC: 34.2 g/dL (ref 30.0–36.0)
MCV: 88.5 fl (ref 78.0–100.0)
Platelets: 305 10*3/uL (ref 150.0–400.0)
RBC: 4.29 Mil/uL (ref 4.22–5.81)
RDW: 13.8 % (ref 11.5–15.5)
WBC: 8.3 10*3/uL (ref 4.0–10.5)

## 2023-07-05 LAB — LIPID PANEL
Cholesterol: 188 mg/dL (ref 0–200)
HDL: 60.2 mg/dL
LDL Cholesterol: 107 mg/dL — ABNORMAL HIGH (ref 0–99)
NonHDL: 127.83
Total CHOL/HDL Ratio: 3
Triglycerides: 104 mg/dL (ref 0.0–149.0)
VLDL: 20.8 mg/dL (ref 0.0–40.0)

## 2023-07-05 LAB — COMPREHENSIVE METABOLIC PANEL WITH GFR
ALT: 17 U/L (ref 0–53)
AST: 14 U/L (ref 0–37)
Albumin: 4.5 g/dL (ref 3.5–5.2)
Alkaline Phosphatase: 75 U/L (ref 39–117)
BUN: 21 mg/dL (ref 6–23)
CO2: 26 meq/L (ref 19–32)
Calcium: 9.6 mg/dL (ref 8.4–10.5)
Chloride: 104 meq/L (ref 96–112)
Creatinine, Ser: 1 mg/dL (ref 0.40–1.50)
GFR: 87.78 mL/min
Glucose, Bld: 99 mg/dL (ref 70–99)
Potassium: 4.9 meq/L (ref 3.5–5.1)
Sodium: 140 meq/L (ref 135–145)
Total Bilirubin: 0.5 mg/dL (ref 0.2–1.2)
Total Protein: 7.2 g/dL (ref 6.0–8.3)

## 2023-07-12 ENCOUNTER — Telehealth: Payer: Self-pay | Admitting: Cardiology

## 2023-07-12 NOTE — Telephone Encounter (Signed)
 Patient c/o Palpitations:  STAT if patient reporting lightheadedness, shortness of breath, or chest pain  How long have you had palpitations/irregular HR/ Afib? Are you having the symptoms now? Yesterday, yes  Are you currently experiencing lightheadedness, SOB or CP? no  Do you have a history of afib (atrial fibrillation) or irregular heart rhythm? Yes afib  Have you checked your BP or HR? (document readings if available): no   Are you experiencing any other symptoms? No

## 2023-07-12 NOTE — Telephone Encounter (Signed)
 Spoke with spouse and she states yesterday patient asked for her app,e watch because he felt like he was in AFIB. His kardia mobile needed a new a battery. Her apple watched stated he was in AFIB. He took an extra dose of propanolol. Last night HR was at 150. Unable to tell me what heart rate or BP is today. They are getting a new battery for kardia mobile and will upload the images when she get home. She denies patient have any chest pain, SOB, headache, nausea or vomiting. I did discuss ED precautions with her. Hard to give recommendations and we do not have any vitals or know how the patient feels at this time.  Tried to reach patient .Left voicemail for patient to return call to office

## 2023-07-27 ENCOUNTER — Other Ambulatory Visit: Payer: Self-pay | Admitting: Family Medicine

## 2023-07-27 ENCOUNTER — Other Ambulatory Visit: Payer: Self-pay | Admitting: Cardiology

## 2023-08-28 ENCOUNTER — Other Ambulatory Visit: Payer: Self-pay

## 2023-08-28 MED ORDER — LOSARTAN POTASSIUM 25 MG PO TABS: 25.0000 mg | ORAL_TABLET | Freq: Every day | ORAL | 0 refills | Status: DC

## 2023-09-17 ENCOUNTER — Other Ambulatory Visit: Payer: Self-pay | Admitting: Cardiology

## 2023-09-25 ENCOUNTER — Other Ambulatory Visit: Payer: Self-pay | Admitting: Family Medicine

## 2023-09-26 ENCOUNTER — Other Ambulatory Visit (HOSPITAL_BASED_OUTPATIENT_CLINIC_OR_DEPARTMENT_OTHER): Payer: Self-pay

## 2023-09-26 MED ORDER — ZEPBOUND 15 MG/0.5ML ~~LOC~~ SOAJ
15.0000 mg | SUBCUTANEOUS | 2 refills | Status: AC
  Filled 2023-09-26 – 2023-10-08 (×2): qty 6, 84d supply, fill #0
  Filled 2024-01-10 – 2024-02-04 (×3): qty 6, 84d supply, fill #1

## 2023-10-08 ENCOUNTER — Other Ambulatory Visit (HOSPITAL_BASED_OUTPATIENT_CLINIC_OR_DEPARTMENT_OTHER): Payer: Self-pay

## 2023-10-15 ENCOUNTER — Ambulatory Visit: Admitting: Family Medicine

## 2023-10-15 ENCOUNTER — Encounter: Payer: Self-pay | Admitting: Family Medicine

## 2023-10-15 VITALS — BP 128/70 | HR 60 | Temp 97.8°F | Resp 16 | Ht 69.0 in | Wt 212.8 lb

## 2023-10-15 DIAGNOSIS — K5900 Constipation, unspecified: Secondary | ICD-10-CM | POA: Diagnosis not present

## 2023-10-15 NOTE — Patient Instructions (Addendum)
 Try 2 tablespoons of milk of mag in 4 oz of warm prune juice. Do that and wait a couple hours. If no improvement, try a Dulcolax suppository and then let me know if we are still having issues.   Stay hydrated. Try to consume at least 60 oz of water daily.   Take Metamucil or Benefiber daily.  Give this 2-3 weeks and if no improvement, let me know.   Let us  know if you need anything.

## 2023-10-15 NOTE — Progress Notes (Signed)
 Chief Complaint  Patient presents with   Rectal Bleeding    Blood in stool    Subjective: Patient is a 53 y.o. male here for blood in stool.  Saw bright red blood in his stool when he has BM's over past couple weeks. Hard and straining when he has BM's. He has a hx of hemorrhoids. No pain, trauma, new N/V, fevers. Tried MiraLAX did not help. He does stay hydrated. No dizziness/lightheadedness.   Past Medical History:  Diagnosis Date   Allergic rhinitis    Anemia    not on meds at this time (06/15/2022)   Anxiety    on meds   Atrial fibrillation (HCC)    Constipation    Depression    on meds   GERD (gastroesophageal reflux disease)    hx of -random epidodes   History of MRSA infection    Hypertension    controlled   Keratoconus of both eyes    Seasonal allergies    Sleep apnea    not using CPAP at this time (06/15/2022   Tremor     Objective: BP 128/70 (BP Location: Left Arm, Patient Position: Sitting)   Pulse 60   Temp 97.8 F (36.6 C) (Oral)   Resp 16   Ht 5' 9 (1.753 m)   Wt 212 lb 12.8 oz (96.5 kg)   SpO2 100%   BMI 31.43 kg/m  General: Awake, appears stated age Heart: RRR Lungs: CTAB, no rales, wheezes or rhonchi. No accessory muscle use Rectal: Sphincter of good tone, no external lesions, no active bleeding or discharge Abdomen: Bowel sounds present, soft, nontender, nondistended Mouth: MMM, no subglossal icterus Psych: Age appropriate judgment and insight, normal affect and mood  Assessment and Plan: Constipation, unspecified constipation type  Probably has an internal hemorrhoid secondary to constipation secondary to Zepbound  usage.  He wants to lose a bit more weight so does not wish to reduce the Zepbound  dosage.  He will start taking a powdered fiber supplement daily and boosting his water intake to at least 60 ounces per day.  A combination of 2 tablespoons of milk of magnesia mixed with 4 ounces of warm prune juice was recommended on an as-needed  basis.  He will let me know in the next few weeks if we are not turning the corner. The patient voiced understanding and agreement to the plan.  Mabel [place], DO 10/15/23  2:45 PM

## 2023-10-16 ENCOUNTER — Other Ambulatory Visit: Payer: Self-pay | Admitting: Family Medicine

## 2023-11-02 ENCOUNTER — Encounter: Payer: Self-pay | Admitting: Cardiology

## 2023-11-16 ENCOUNTER — Other Ambulatory Visit: Payer: Self-pay | Admitting: Family Medicine

## 2023-11-16 ENCOUNTER — Other Ambulatory Visit: Payer: Self-pay | Admitting: Cardiology

## 2023-11-20 NOTE — Progress Notes (Deleted)
 Cardiology Office Note:  .   Date:  11/20/2023  ID:  Devin [name], DOB December 03, 1970, MRN [id number] PCP: [name], DO  [facility] Providers Cardiologist:  [name], DO {  History of Present Illness: .   Devin [name] is a 53 y.o. male  with PMHx of paroxysmal atrial fibrillation (2022, not on AC), OSA, HTN, obesity, essential tremors who reports to [facility] office for follow up.   Last seen in heartcare 10/2022 by Dr. Huntsman.  Overall doing well from cardiac standpoint without any complaints.  Noted still waiting for his CPAP due to shortage.  Continued on flecainide  50 mg twice daily, losartan  25 mg daily, propranolol  10 mg TID.   Today, reports ### and denies ###.  Reports compliance with medications.  Dietary habitats:  Activity level:  Social: Denies tobacco use/alcohol/drug use  Denies any recent hospitalizations or visits to the emergency department.   PAF (paroxysmal atrial fibrillation) (HCC) ECHO 07/2019: EF 60 to 65% with normal structure and function Zio 08/2019: sinus rhythm, nocturnal AF sinus tachycardia, rare PVCs, no A-fib/sustained arrhythmias. Cardiac CTA 09/2020: CAC score of 0, 0 percentile, no evidence of CAD.  Exercise Myoview  01/2021: normal study, low risk Reviewed EKG on or EKG today shows  On exam noted regular, rate and rhythm OR irregularly irregular.  Denies palpitations or active bleeding.  CHADSVASC 1. No indications at this time for [facility].  Continue on  flecainide  50 mg twice daily, propranolol  10 mg TID.   Primary hypertension Reports well controlled Home BP:  BP this OV well controlled today:  Continue on losartan  25 mg daily Encourage physical activity for 150 minutes per week and heart healthy low sodium diet. Discussed limiting sodium intake to < 2 grams daily.     OSA (obstructive sleep apnea)   ROS: 10 point review of system has been reviewed and considered negative except ones been listed in the HPI.   Studies  Reviewed: SABRA   ECHO 07/2019 IMPRESSIONS   1. Left ventricular ejection fraction, by estimation, is 60 to 65%. The  left ventricle has normal function. The left ventricle has no regional  wall motion abnormalities. Left ventricular diastolic parameters were  normal.   2. Right ventricular systolic function is normal. The right ventricular  size is normal. There is normal pulmonary artery systolic pressure. The  estimated right ventricular systolic pressure is 21.3 mmHg.   3. The mitral valve is normal in structure. No evidence of mitral valve  regurgitation.   4. The aortic valve is tricuspid. Aortic valve regurgitation is not  visualized. No aortic stenosis is present.   5. The inferior vena cava is normal in size with <50% respiratory  variability, suggesting right atrial pressure of 8 mmHg.   Zio 08/2019 Sinus rhythm Nocturnal sinus bradycardia Rare premature ventricular contractions No afib No sustained arrhythmias  Cardiac CTA 09/2020 IMPRESSION: 1. Coronary calcium score of 0. This was 0 percentile for age and sex matched control. 2. Normal coronary origin with right dominance. 3. No evidence of CAD. CAD-RADS 0. No evidence of CAD (0%). Consider non-atherosclerotic causes of chest pain.  Exercise Myoview  01/2021   The study is normal. The study is low risk.   Patient exercised according to the BRUCE protocol for 9:29min achieving 11.   Target HR was achieved (166bpm; 96% MPHR)   No ST deviation was noted.   LV perfusion is normal.   Left ventricular function is normal. End diastolic cavity size is normal.  Prior study not available for comparison.  Risk Assessment/Calculations:   {Does this patient have ATRIAL FIBRILLATION?:[phone]} No BP recorded.  {Refresh Note OR Click here to enter BP  :1}***       Physical Exam:   VS:  There were no vitals taken for this visit.   Wt Readings from Last 3 Encounters:  10/15/23 212 lb 12.8 oz (96.5 kg)  07/04/23 219 lb  (99.3 kg)  12/19/22 250 lb 6.4 oz (113.6 kg)    GEN: Well nourished, well developed in no acute distress while sitting in chair.  NECK: No JVD; No carotid bruits CARDIAC: ***RRR, no murmurs, rubs, gallops RESPIRATORY:  Clear to auscultation without rales, wheezing or rhonchi  ABDOMEN: Soft, non-tender, non-distended EXTREMITIES:  No edema; No deformity   ASSESSMENT AND PLAN: .   ***    {Are you ordering a CV Procedure (e.g. stress test, cath, DCCV, TEE, etc)?   Press F2        :[id number]}  Dispo: ***  Signed, [name], PA-C

## 2023-11-22 ENCOUNTER — Ambulatory Visit: Admitting: General Practice

## 2023-11-22 DIAGNOSIS — G4733 Obstructive sleep apnea (adult) (pediatric): Secondary | ICD-10-CM

## 2023-11-22 DIAGNOSIS — I10 Essential (primary) hypertension: Secondary | ICD-10-CM

## 2023-11-22 DIAGNOSIS — I480 Paroxysmal atrial fibrillation: Secondary | ICD-10-CM

## 2023-11-27 ENCOUNTER — Ambulatory Visit (HOSPITAL_BASED_OUTPATIENT_CLINIC_OR_DEPARTMENT_OTHER)
Admission: EM | Admit: 2023-11-27 | Discharge: 2023-11-27 | Disposition: A | Discharge status: Home / Self Care | Attending: Family Medicine | Admitting: Family Medicine

## 2023-11-27 ENCOUNTER — Other Ambulatory Visit (HOSPITAL_BASED_OUTPATIENT_CLINIC_OR_DEPARTMENT_OTHER): Payer: Self-pay

## 2023-11-27 ENCOUNTER — Encounter (HOSPITAL_BASED_OUTPATIENT_CLINIC_OR_DEPARTMENT_OTHER): Payer: Self-pay

## 2023-11-27 DIAGNOSIS — J0100 Acute maxillary sinusitis, unspecified: Secondary | ICD-10-CM | POA: Diagnosis not present

## 2023-11-27 DIAGNOSIS — H65191 Other acute nonsuppurative otitis media, right ear: Secondary | ICD-10-CM

## 2023-11-27 MED ORDER — CETIRIZINE HCL 10 MG PO TABS
10.0000 mg | ORAL_TABLET | Freq: Every day | ORAL | 1 refills | Status: AC
  Filled 2023-11-27: qty 30, 30d supply, fill #0

## 2023-11-27 MED ORDER — PREDNISONE 20 MG PO TABS
40.0000 mg | ORAL_TABLET | Freq: Every day | ORAL | 0 refills | Status: AC
  Filled 2023-11-27: qty 10, 5d supply, fill #0

## 2023-11-27 NOTE — ED Triage Notes (Signed)
 Pt states for the past week or 2 he has been having nasal congestion. Yesterday he started to have right ear fullness and feeling like he was in a bubble. His left ear has started to feel irritated as well. He has not taken anything for his symptoms.

## 2023-11-27 NOTE — ED Provider Notes (Signed)
 Devin [name]    CSN: [id number] Arrival date & time: 11/27/23  0805      History   Chief Complaint Chief Complaint  Patient presents with   Ear Fullness    HPI Devin [name] is a 53 y.o. male.   Patient is a 53 year old male who presents today with ear fullness and nasal congestion.  Pt states for the past week or 2 he has been having nasal congestion. Yesterday he started to have right ear fullness and feeling like he was in a bubble. His left ear has started to feel irritated as well. He has not taken anything for his symptoms. No fever.     Ear Fullness    Past Medical History:  Diagnosis Date   Allergic rhinitis    Anemia    not on meds at this time (06/15/2022)   Anxiety    on meds   Atrial fibrillation (HCC)    Constipation    Depression    on meds   GERD (gastroesophageal reflux disease)    hx of -random epidodes   History of MRSA infection    Hypertension    controlled   Keratoconus of both eyes    Seasonal allergies    Sleep apnea    not using CPAP at this time (06/15/2022   Tremor     Patient Active Problem List   Diagnosis Date Noted   Chronic sinusitis 01/18/2022   Costochondritis 01/18/2022   Depression, recurrent 03/16/2021   GAD (generalized anxiety disorder) 03/16/2021   Fatigue 03/16/2021   Medication management 02/02/2021   PAF (paroxysmal atrial fibrillation) (HCC) 12/28/2020   Sinus bradycardia 12/28/2020   OSA (obstructive sleep apnea) 12/28/2020   Obesity (BMI 30-39.9) 12/28/2020   Constipation 09/09/2020   GERD (gastroesophageal reflux disease) 09/09/2020   History of MRSA infection 09/09/2020   Keratoconus of both eyes 09/09/2020   Paroxysmal atrial fibrillation (HCC) 08/17/2020   Insomnia 05/22/2019   Atypical chest pain 12/27/2014   Obesity (BMI 30.0-34.9) 09/18/2014   Bronchitis, subacute 04/14/2014   Neck pain 02/24/2014   Resting tremor 10/29/2013   Weight gain 12/21/2011   Chronic pain of left knee  06/26/2011   Depression with anxiety 04/29/2010   Keratoconus 04/29/2010   CARDIOMEGALY 03/25/2010   HTN (hypertension) 12/31/2006   HEART MURMUR, HX OF 12/31/2006    Past Surgical History:  Procedure Laterality Date   NECK SURGERY  02/03/14       Home Medications    Prior to Admission medications   Medication Sig Start Date End Date Taking? Authorizing Provider  cetirizine (ZYRTEC ALLERGY ) 10 MG tablet Take 1 tablet (10 mg total) by mouth daily. 11/27/23  Yes Pati Thinnes A, FNP  predniSONE  (DELTASONE ) 20 MG tablet Take 2 tablets (40 mg total) by mouth daily with breakfast for 5 days. 11/27/23 12/02/23 Yes Magdaleno Lortie A, FNP  EPINEPHrine  0.3 mg/0.3 mL IJ SOAJ injection Inject 0.3 mg into the muscle as needed for anaphylaxis. As needed for life-threatening allergic reactions 03/23/22   Kozlow, Camellia PARAS, MD  flecainide  (TAMBOCOR ) 50 MG tablet TAKE ONE TABLET BY MOUTH TWICE DAILY (ROUND WHITE TABLET WITH cc 11 or BPL 025) 06/27/23   Tobb, Kardie, DO  losartan  (COZAAR ) 25 MG tablet Take 1 tablet (25 mg total) by mouth daily. (OVAL WHITE TABLET WITH 5 l) 08/28/23   Tobb, Kardie, DO  nitroGLYCERIN  (NITROSTAT ) 0.4 MG SL tablet Place 1 tablet (0.4 mg total) under the tongue every 5 (five) minutes as  needed for chest pain. 09/22/20 06/15/22  Tobb, Kardie, DO  propranolol  (INDERAL ) 10 MG tablet TAKE ONE TABLET BY MOUTH THREE TIMES DAILY. (ROUND ORANGE TABLET) 02/19/23   Tobb, Kardie, DO  tirzepatide  (ZEPBOUND ) 15 MG/0.5ML Pen Inject 15 mg into the skin once a week. 09/26/23   [name], DO  venlafaxine  XR (EFFEXOR -XR) 37.5 MG 24 hr capsule TAKE ONE CAPSULE BY MOUTH EVERY DAY WITH BREAKFAST. (PINK CAPSULE OR GRAY AND PINK CAPSULE) 10/16/23   Wendling, Mabel Mt, DO    Family History Family History  Problem Relation Age of Onset   Diabetes Mother    Hyperlipidemia Mother    Heart disease Mother        CHF   Thyroid  disease Mother        Graves Disease   Cancer Mother    Alcoholism  Mother    Colon polyps Father    Hypertension Father    Alcoholism Father    Drug abuse Father    Obesity Father    Colon polyps Sister    Brain cancer Brother        GBM   Asthma Daughter    ADD / ADHD Daughter    Colon cancer Neg Hx    Esophageal cancer Neg Hx    Stomach cancer Neg Hx    Rectal cancer Neg Hx     Social History Social History   Tobacco Use   Smoking status: Never   Smokeless tobacco: Never  Vaping Use   Vaping status: Never Used  Substance Use Topics   Alcohol use: Not Currently    Alcohol/week: 0.0 - 5.0 standard drinks of alcohol    Comment: Rarely   Drug use: Never     Allergies   Shellfish allergy  and Penicillins   Review of Systems Review of Systems  See HPI Physical Exam Triage Vital Signs ED Triage Vitals  Encounter Vitals Group     BP 11/27/23 0820 (!) 142/87     Girls Systolic BP Percentile --      Girls Diastolic BP Percentile --      Boys Systolic BP Percentile --      Boys Diastolic BP Percentile --      Pulse Rate 11/27/23 0820 (!) 54     Resp 11/27/23 0820 20     Temp 11/27/23 0820 97.7 F (36.5 C)     Temp Source 11/27/23 0820 Oral     SpO2 11/27/23 0820 98 %     Weight --      Height --      Head Circumference --      Peak Flow --      Pain Score 11/27/23 0818 1     Pain Loc --      Pain Education --      Exclude from Growth Chart --    No data found.  Updated Vital Signs BP (!) 142/87 (BP Location: Right Arm)   Pulse (!) 54   Temp 97.7 F (36.5 C) (Oral)   Resp 20   SpO2 98%   Visual Acuity Right Eye Distance:   Left Eye Distance:   Bilateral Distance:    Right Eye Near:   Left Eye Near:    Bilateral Near:     Physical Exam Constitutional:      General: He is not in acute distress.    Appearance: Normal appearance. He is not ill-appearing, toxic-appearing or diaphoretic.  HENT:     Right Ear: A  middle ear effusion is present. Tympanic membrane is injected.     Left Ear: There is impacted  cerumen.     Nose: Congestion present.  Eyes:     Conjunctiva/sclera: Conjunctivae normal.  Pulmonary:     Effort: Pulmonary effort is normal.  Musculoskeletal:        General: Normal range of motion.  Skin:    General: Skin is warm and dry.  Neurological:     Mental Status: He is alert.  Psychiatric:        Mood and Affect: Mood normal.      UC Treatments / Results  Labs (all labs ordered are listed, but only abnormal results are displayed) Labs Reviewed - No data to display  EKG   Radiology No results found.  Procedures Procedures (including critical [name] time)  Medications Ordered in UC Medications - No data to display  Initial Impression / Assessment and Plan / UC Course  I have reviewed the triage vital signs and the nursing notes.  Pertinent labs & imaging results that were available during my [name] of the patient were reviewed by me and considered in my medical decision making (see chart for details).     Acute sinusitis with right ear effusion- Prescribing prednisone  to take daily over the next 5 days.  Take this with food.  This should hopefully help with the sinus inflammation and drainage of the ear.  I have also sent in Zyrtec allergy  medication to start daily. If symptoms do not improve please follow-up Final Clinical Impressions(s) / UC Diagnoses   Final diagnoses:  Acute non-recurrent maxillary sinusitis  Acute effusion of right ear     Discharge Instructions      Prescribing prednisone  to take daily over the next 5 days.  Take this with food.  This should hopefully help with the sinus inflammation and drainage of the ear.  I have also sent in Zyrtec allergy  medication to start daily. If symptoms do not improve please follow-up     ED Prescriptions     Medication Sig Dispense Auth. Provider   predniSONE  (DELTASONE ) 20 MG tablet Take 2 tablets (40 mg total) by mouth daily with breakfast for 5 days. 10 tablet Rane Blitch A, FNP    cetirizine (ZYRTEC ALLERGY ) 10 MG tablet Take 1 tablet (10 mg total) by mouth daily. 30 tablet Adah Wilbert LABOR, FNP      PDMP not reviewed this encounter.   Adah Wilbert LABOR, FNP 11/27/23 [phone]

## 2023-11-27 NOTE — Discharge Instructions (Addendum)
 Prescribing prednisone  to take daily over the next 5 days.  Take this with food.  This should hopefully help with the sinus inflammation and drainage of the ear.  I have also sent in Zyrtec allergy  medication to start daily. If symptoms do not improve please follow-up

## 2023-11-30 ENCOUNTER — Ambulatory Visit: Attending: Cardiology | Admitting: Cardiology

## 2023-11-30 ENCOUNTER — Telehealth: Payer: Self-pay | Admitting: Cardiology

## 2023-11-30 ENCOUNTER — Encounter: Payer: Self-pay | Admitting: Cardiology

## 2023-11-30 VITALS — BP 118/72 | HR 59 | Ht 69.0 in | Wt 219.2 lb

## 2023-11-30 DIAGNOSIS — Z79899 Other long term (current) drug therapy: Secondary | ICD-10-CM | POA: Diagnosis not present

## 2023-11-30 DIAGNOSIS — I10 Essential (primary) hypertension: Secondary | ICD-10-CM | POA: Diagnosis not present

## 2023-11-30 NOTE — Telephone Encounter (Signed)
 Hi Katie This is Placido's wife Keene  I told him to clear his voicemail!  The reason he called is that it's not normal for him his HR is usually low in the mornings usually around 45-50 and it was jumping around from 50 up to 70 then 77 then back down to 50 then 48 and then back up and he could feel it in the middle of the night and when he woke up he was nauseous and still felt it so he checked it on his watch and we noticed the jumping up and down so we wanted to see if he could get an appointment just to be sure nothing else is going on.  Called the patient again- s/w the patient. He denies any chest pain, shortness of breath, or dizziness at this time. Heart rate has been jumping around. Woke him up last night and even felt a little nauseous. Appointment made for today at 2:20 with APP. Informed of location and floor. Verbalized understanding

## 2023-11-30 NOTE — Telephone Encounter (Signed)
 Tried to call patient back and voicemail full. Will all so send mychart message

## 2023-11-30 NOTE — Telephone Encounter (Signed)
 STAT if HR is under 50 or over 120  (normal HR is 60-100 beats per minute)  What is your heart rate?  Patient thinks his HR has been elevated. He says his baseline is normally around 45 bpm. Recently it has been in 70's. This morning it was 77.  Do you have a log of your heart rate readings (document readings)?  No log available.  Do you have any other symptoms?  No

## 2023-11-30 NOTE — Progress Notes (Unsigned)
 Cardiology Office Note    Date:  11/30/2023  ID:  Devin [name], DOB 11-01-1970, MRN [id number] PCP:  [name], DO  Cardiologist:  Kardie Tobb, DO  Electrophysiologist:  None   Chief Complaint: Follow up for palpitations   History of Present Illness: .    Devin [name] is a 53 y.o. male with visit-pertinent history of paroxysmal atrial fibrillation not on anticoagulation, OSA, hypertension and obesity.  Patient initially presented to atrial fibrillation clinic in 2021 after presenting to the emergency department with atrial fibrillation with RVR, spontaneously converted.  Patient was previously on propranolol  for tremors with resultant bradycardia with heart rate in the upper 30s and 40s.   Patient was later started on flecainide  50 mg twice a day.  Today patient presents regarding increased heart rates at home.  Patient reports earlier this year that he had an episode of atrial fibrillation. Patient notes that his heart rate typically runs in the 50-60's. Patient reports that his heart rate was in the 70's this morning when he woke up during the night and felt as though his heart was beating fast, notes that this did not feel like his typical atrial fibrillation.  He noted that his heart rate was jumping per his Apple watch from the 40s into the 70s, he also noted some increased palpitations.  Patient denied any sustained high or low heart rates just noted that his heart rate was jumping.  Patient notes that he was started on prednisone  earlier this week in setting of sinusitis, reports that this has overall resolved and questions if his heart rate fluctuations is related to prednisone  use.  Patient denies any chest pain, lower extremity edema, shortness of breath, orthopnea or PND.  Patient reports that he is no longer going to take any prednisone .  ROS: .   Today he denies chest pain, shortness of breath, lower extremity edema, fatigue, melena, hematuria,  hemoptysis, diaphoresis, weakness, presyncope, syncope, orthopnea, and PND.  All other systems are reviewed and otherwise negative. Studies Reviewed: SABRA   EKG:  EKG is ordered today, personally reviewed, demonstrating  EKG Interpretation Date/Time:  Friday November 30 2023 14:19:01 EDT Ventricular Rate:  64 PR Interval:  168 QRS Duration:  92 QT Interval:  432 QTC Calculation: 445 R Axis:   21  Text Interpretation: Normal sinus rhythm Normal ECG When compared with ECG of 06-Nov-2022 09:59, Incomplete right bundle branch block is no longer Present Confirmed by Arleigh Dicola [phone]) on 11/30/2023 2:24:16 PM   CV Studies: Cardiac studies reviewed are outlined and summarized above. Otherwise please see EMR for full report. Cardiac Studies & Procedures   ______________________________________________________________________________________________   STRESS TESTS  MYOCARDIAL PERFUSION IMAGING 02/04/2021  Interpretation Summary   The study is normal. The study is low risk.   Patient exercised according to the BRUCE protocol for 9:94min achieving 11.   Target HR was achieved (166bpm; 96% MPHR)   No ST deviation was noted.   LV perfusion is normal.   Left ventricular function is normal. End diastolic cavity size is normal.   Prior study not available for comparison.   ECHOCARDIOGRAM  ECHOCARDIOGRAM COMPLETE 08/12/2019  Narrative ECHOCARDIOGRAM REPORT    Patient Name:   Devin [name] Aten Date of Exam: 08/12/2019 Medical Rec #:  [id number]        Height:       70.0 in Accession #:    [id number]       Weight:  223.4 lb Date of Birth:  1970/03/18       BSA:          2.188 m Patient Age:    48 years         BP:           138/74 mmHg Patient Gender: M                HR:           47 bpm. Exam Location:  Outpatient  Procedure: 2D Echo  Indications:    Atrial Fibrillation I48.91  History:        Patient has no prior history of Echocardiogram examinations. Risk  Factors:Hypertension.  Sonographer:    Augustin Seals RDCS (AE) Referring Phys: [phone] DONNA C CARROLL  IMPRESSIONS   1. Left ventricular ejection fraction, by estimation, is 60 to 65%. The left ventricle has normal function. The left ventricle has no regional wall motion abnormalities. Left ventricular diastolic parameters were normal. 2. Right ventricular systolic function is normal. The right ventricular size is normal. There is normal pulmonary artery systolic pressure. The estimated right ventricular systolic pressure is 21.3 mmHg. 3. The mitral valve is normal in structure. No evidence of mitral valve regurgitation. 4. The aortic valve is tricuspid. Aortic valve regurgitation is not visualized. No aortic stenosis is present. 5. The inferior vena cava is normal in size with <50% respiratory variability, suggesting right atrial pressure of 8 mmHg.  FINDINGS Left Ventricle: Left ventricular ejection fraction, by estimation, is 60 to 65%. The left ventricle has normal function. The left ventricle has no regional wall motion abnormalities. The left ventricular internal cavity size was normal in size. There is no left ventricular hypertrophy. Left ventricular diastolic parameters were normal.  Right Ventricle: The right ventricular size is normal. No increase in right ventricular wall thickness. Right ventricular systolic function is normal. There is normal pulmonary artery systolic pressure. The tricuspid regurgitant velocity is 2.14 m/s, and with an assumed right atrial pressure of 3 mmHg, the estimated right ventricular systolic pressure is 21.3 mmHg.  Left Atrium: Left atrial size was normal in size.  Right Atrium: Right atrial size was normal in size.  Pericardium: There is no evidence of pericardial effusion.  Mitral Valve: The mitral valve is normal in structure. No evidence of mitral valve regurgitation.  Tricuspid Valve: The tricuspid valve is normal in structure. Tricuspid  valve regurgitation is trivial.  Aortic Valve: The aortic valve is tricuspid. Aortic valve regurgitation is not visualized. No aortic stenosis is present.  Pulmonic Valve: The pulmonic valve was not well visualized. Pulmonic valve regurgitation is not visualized.  Aorta: The aortic root is normal in size and structure.  Venous: The inferior vena cava is normal in size with less than 50% respiratory variability, suggesting right atrial pressure of 8 mmHg.  IAS/Shunts: The interatrial septum was not well visualized.   LEFT VENTRICLE PLAX 2D LVIDd:         5.30 cm      Diastology LVIDs:         3.60 cm      LV e' lateral:   11.50 cm/s LV PW:         0.80 cm      LV E/e' lateral: 9.6 LV IVS:        0.80 cm      LV e' medial:    11.50 cm/s LVOT diam:     2.00 cm  LV E/e' medial:  9.6 LV SV:         88 LV SV Index:   40 LVOT Area:     3.14 cm  LV Volumes (MOD) LV vol d, MOD A2C: 71.7 ml LV vol d, MOD A4C: 113.0 ml LV vol s, MOD A2C: 25.2 ml LV vol s, MOD A4C: 52.8 ml LV SV MOD A2C:     46.5 ml LV SV MOD A4C:     113.0 ml LV SV MOD BP:      56.7 ml  RIGHT VENTRICLE RV S prime:     13.30 cm/s TAPSE (M-mode): 3.2 cm  LEFT ATRIUM             Index       RIGHT ATRIUM           Index LA diam:        3.60 cm 1.65 cm/m  RA Area:     17.80 cm LA Vol (A2C):   58.7 ml 26.83 ml/m RA Volume:   40.90 ml  18.69 ml/m LA Vol (A4C):   42.4 ml 19.38 ml/m LA Biplane Vol: 50.9 ml 23.27 ml/m AORTIC VALVE LVOT Vmax:   106.00 cm/s LVOT Vmean:  65.900 cm/s LVOT VTI:    0.279 m  AORTA Ao Root diam: 3.10 cm  MITRAL VALVE                TRICUSPID VALVE MV Area (PHT): 4.06 cm     TR Peak grad:   18.3 mmHg MV Decel Time: 187 msec     TR Vmax:        214.00 cm/s MV E velocity: 110.00 cm/s MV A velocity: 67.70 cm/s   SHUNTS MV E/A ratio:  1.62         Systemic VTI:  0.28 m Systemic Diam: 2.00 cm  Lonni Nanas MD Electronically signed by Lonni Nanas MD Signature  Date/Time: 08/12/2019/9:50:45 PM    Final    MONITORS  LONG TERM MONITOR (3-14 DAYS) 09/05/2019  Narrative Sinus rhythm Nocturnal sinus bradycardia Rare premature ventricular contractions No afib No sustained arrhythmias   CT SCANS  CT CORONARY MORPH W/CTA COR W/SCORE 09/30/2020  Addendum 09/30/2020 12:20 PM ADDENDUM REPORT: 09/30/2020 12:17  CLINICAL DATA:  This is a 53 year old male with chest pain.  EXAM: Cardiac/Coronary  CTA  TECHNIQUE: The patient was scanned on a [employer].  FINDINGS: A 100 kV prospective scan was triggered in the descending thoracic aorta at 111 HU's. Axial non-contrast 3 mm slices were carried out through the heart. The data set was analyzed on a dedicated work station and scored using the Agatson method. Gantry rotation speed was 250 msecs and collimation was .6 mm. No beta blockade and 0.8 mg of sl NTG was given. The 3D data set was reconstructed in 5% intervals of the 67-82 % of the R-R cycle. Diastolic phases were analyzed on a dedicated work station using MPR, MIP and VRT modes. The patient received 80 cc of contrast.  Aorta:  Normal size.  No calcifications.  No dissection.  Aortic Valve:  Trileaflet.  No calcifications.  Coronary Arteries:  Normal coronary origin.  Right dominance.  RCA is a large dominant artery that gives rise to PDA and PLA. There is no plaque.  Left main is a large artery that gives rise to LAD and LCX arteries.  LAD is a large vessel that has no plaque.  LCX is a non-dominant artery that gives  rise to one large OM1 branch. There is no plaque.  Other findings:  Normal pulmonary vein drainage into the left atrium.  Normal left atrial appendage without a thrombus.  Normal size of the pulmonary artery.  IMPRESSION: 1. Coronary calcium score of 0. This was 0 percentile for age and sex matched control.  2. Normal coronary origin with right dominance.  3. No evidence of CAD. CAD-RADS 0.  No evidence of CAD (0%). Consider non-atherosclerotic causes of chest pain.   Electronically Signed By: Kardie  Tobb DO On: 09/30/2020 12:17  Narrative EXAM: OVER-READ INTERPRETATION  CT CHEST  The following report is an over-read performed by radiologist Dr. Toribio Aye of [facility] on 09/30/2020. This over-read does not include interpretation of cardiac or coronary anatomy or pathology. The coronary calcium score/coronary CTA interpretation by the cardiologist is attached.  COMPARISON:  None.  FINDINGS: Within the visualized portions of the thorax there are no suspicious appearing pulmonary nodules or masses, there is no acute consolidative airspace disease, no pleural effusions, no pneumothorax and no lymphadenopathy. Visualized portions of the upper abdomen are unremarkable. There are no aggressive appearing lytic or blastic lesions noted in the visualized portions of the skeleton.  IMPRESSION: No significant incidental noncardiac findings are noted.  Electronically Signed: By: Toribio Aye M.D. On: 09/30/2020 08:57     ______________________________________________________________________________________________       Current Reported Medications:.    Current Meds  Medication Sig   cetirizine (ZYRTEC ALLERGY ) 10 MG tablet Take 1 tablet (10 mg total) by mouth daily.   EPINEPHrine  0.3 mg/0.3 mL IJ SOAJ injection Inject 0.3 mg into the muscle as needed for anaphylaxis. As needed for life-threatening allergic reactions   flecainide  (TAMBOCOR ) 50 MG tablet TAKE ONE TABLET BY MOUTH TWICE DAILY (ROUND WHITE TABLET WITH cc 11 or BPL 025)   losartan  (COZAAR ) 25 MG tablet Take 1 tablet (25 mg total) by mouth daily. (OVAL WHITE TABLET WITH 5 l)   nitroGLYCERIN  (NITROSTAT ) 0.4 MG SL tablet Place 1 tablet (0.4 mg total) under the tongue every 5 (five) minutes as needed for chest pain.   predniSONE  (DELTASONE ) 20 MG tablet Take 2 tablets (40 mg total) by  mouth daily with breakfast for 5 days.   propranolol  (INDERAL ) 10 MG tablet TAKE ONE TABLET BY MOUTH THREE TIMES DAILY. (ROUND ORANGE TABLET)   tirzepatide  (ZEPBOUND ) 15 MG/0.5ML Pen Inject 15 mg into the skin once a week.   venlafaxine  XR (EFFEXOR -XR) 37.5 MG 24 hr capsule TAKE ONE CAPSULE BY MOUTH EVERY DAY WITH BREAKFAST. (PINK CAPSULE OR GRAY AND PINK CAPSULE)    Physical Exam:    VS:  BP 118/72   Pulse (!) 59   Ht 5' 9 (1.753 m)   Wt 219 lb 3.2 oz (99.4 kg)   SpO2 99%   BMI 32.37 kg/m    Wt Readings from Last 3 Encounters:  11/30/23 219 lb 3.2 oz (99.4 kg)  10/15/23 212 lb 12.8 oz (96.5 kg)  07/04/23 219 lb (99.3 kg)    GEN: Well nourished, well developed in no acute distress NECK: No JVD; No carotid bruits CARDIAC: RRR, no murmurs, rubs, gallops RESPIRATORY:  Clear to auscultation without rales, wheezing or rhonchi  ABDOMEN: Soft, non-tender, non-distended EXTREMITIES:  No edema; No acute deformity     Asessement and Plan:.    PAF: Patient with history of paroxysmal atrial fibrillation on flecainide  and propranolol .  Patient notes that his atrial fibrillation is typically very well-controlled, notes 1 episode  of atrial fibrillation earlier in the year.  Patient reports that he was started on prednisone  earlier this week, he noted that last night he awoke with his heart feeling as it was beating faster than normal, did not feel like his typical atrial fibrillation.  He also reported that his heart rate was jumping moving from 70-60 then to 40 then back up into the 60s.  Patient denied any chest pain, shortness of breath, dizziness, lightheadedness, presyncope or syncope.  Patient feels this is likely related to prednisone  use, he will continue to monitor symptoms off of prednisone .  He will notify the office of any recurrence, can consider cardiac monitor at that time.  Discussed anticoagulation, through shared decision making elected not to start on anticoagulation at this time  given CHADS2 Vascor of 1. Patient denies any bleeding problems on Eliquis . Check CBC, BMET and flecainide  level.  CHA2DS2-VASc Score = 1 [CHF History: 0, HTN History: 1, Diabetes History: 0, Stroke History: 0, Vascular Disease History: 0, Age Score: 0, Gender Score: 0].  Therefore, the patient's annual risk of stroke is 0.6 %.      OSA: Notes he is unable to tolerate a CPAP device.     Disposition: F/u with Norma Ignasiak, NP in 8 weeks, discussed with patient that if he is feeling well at that time he can reschedule and plan for a 1 year follow-up.  Signed, Belanna Manring D Tajanay Hurley, NP

## 2023-11-30 NOTE — Patient Instructions (Addendum)
 Medication Instructions:  Your physician recommends that you continue on your current medications as directed. Please refer to the Current Medication list given to you today.  *If you need a refill on your cardiac medications before your next appointment, please call your pharmacy*  Lab Work: CBC, BMET, FLECAINIDE  LEVEL-TODAY If you have labs (blood work) drawn today and your tests are completely normal, you will receive your results only by: MyChart Message (if you have MyChart) OR A paper copy in the mail If you have any lab test that is abnormal or we need to change your treatment, we will call you to review the results.  Follow-Up: At [facility], you and your health needs are our priority.  As part of our continuing mission to provide you with exceptional heart care, our providers are all part of one team.  This team includes your primary Cardiologist (physician) and Advanced Practice Providers or APPs (Physician Assistants and Nurse Practitioners) who all work together to provide you with the care you need, when you need it.  Your next appointment:   8 week(s)  Provider:   Katlyn West, NP

## 2023-12-02 ENCOUNTER — Encounter: Payer: Self-pay | Admitting: Cardiology

## 2023-12-02 ENCOUNTER — Encounter: Payer: Self-pay | Admitting: Family Medicine

## 2023-12-05 ENCOUNTER — Ambulatory Visit: Payer: Self-pay | Admitting: Cardiology

## 2023-12-05 LAB — CBC
Hematocrit: 40.9 % (ref 37.5–51.0)
Hemoglobin: 13.6 g/dL (ref 13.0–17.7)
MCH: 30.6 pg (ref 26.6–33.0)
MCHC: 33.3 g/dL (ref 31.5–35.7)
MCV: 92 fL (ref 79–97)
Platelets: 348 x10E3/uL (ref 150–450)
RBC: 4.44 x10E6/uL (ref 4.14–5.80)
RDW: 12.8 % (ref 11.6–15.4)
WBC: 8.1 x10E3/uL (ref 3.4–10.8)

## 2023-12-05 LAB — BASIC METABOLIC PANEL WITH GFR
BUN/Creatinine Ratio: 18 (ref 9–20)
BUN: 17 mg/dL (ref 6–24)
CO2: 23 mmol/L (ref 20–29)
Calcium: 9.4 mg/dL (ref 8.7–10.2)
Chloride: 100 mmol/L (ref 96–106)
Creatinine, Ser: 0.96 mg/dL (ref 0.76–1.27)
Glucose: 82 mg/dL (ref 70–99)
Potassium: 4.6 mmol/L (ref 3.5–5.2)
Sodium: 140 mmol/L (ref 134–144)
eGFR: 96 mL/min/1.73 (ref >59)

## 2023-12-05 LAB — FLECAINIDE LEVEL: Flecainide: 0.09 ug/mL — AB (ref 0.20–1.00)

## 2023-12-17 ENCOUNTER — Telehealth: Payer: Self-pay | Admitting: Cardiology

## 2023-12-17 MED ORDER — FLECAINIDE ACETATE 50 MG PO TABS: 50.0000 mg | ORAL_TABLET | Freq: Two times a day (BID) | ORAL | 3 refills | Status: AC

## 2023-12-17 MED ORDER — LOSARTAN POTASSIUM 25 MG PO TABS: 25.0000 mg | ORAL_TABLET | Freq: Every day | ORAL | 3 refills | Status: AC

## 2023-12-17 NOTE — Telephone Encounter (Signed)
 Sent in refills

## 2023-12-17 NOTE — Telephone Encounter (Signed)
*  STAT* If patient is at the pharmacy, call can be transferred to refill team.   1. Which medications need to be refilled? (please list name of each medication and dose if known) flecainide  (TAMBOCOR ) 50 MG tablet   losartan  (COZAAR ) 25 MG tablet    2. Would you like to learn more about the convenience, safety, & potential cost savings by using the [facility]? No    3. Are you open to using the [facility] (Type [facility]. No    4. Which pharmacy/location (including street and city if local pharmacy) is medication to be sent to? [facility] - [place], [place] - [phone]     5. Do they need a 30 day or 90 day supply? 90 day

## 2023-12-25 ENCOUNTER — Ambulatory Visit: Admitting: General Practice

## 2024-01-04 ENCOUNTER — Ambulatory Visit: Admitting: Family Medicine

## 2024-01-10 ENCOUNTER — Telehealth (HOSPITAL_BASED_OUTPATIENT_CLINIC_OR_DEPARTMENT_OTHER): Payer: Self-pay | Admitting: Pharmacy Technician

## 2024-01-10 ENCOUNTER — Other Ambulatory Visit (HOSPITAL_BASED_OUTPATIENT_CLINIC_OR_DEPARTMENT_OTHER): Payer: Self-pay

## 2024-01-10 NOTE — Telephone Encounter (Signed)
 Medication: Zepbound  15mg   Able to fill? No Prior authorization required? Yes Co-pay before assistance: n/a

## 2024-01-11 ENCOUNTER — Other Ambulatory Visit (HOSPITAL_COMMUNITY): Payer: Self-pay

## 2024-01-11 ENCOUNTER — Telehealth (HOSPITAL_BASED_OUTPATIENT_CLINIC_OR_DEPARTMENT_OTHER): Payer: Self-pay

## 2024-01-11 NOTE — Telephone Encounter (Signed)
 Pharmacy Patient Advocate Encounter   Received notification from Pt Calls Messages that prior authorization for Zepbound  15mg /0.66ml is required/requested.   Insurance verification completed.   The patient is insured through [facility].   Per test claim: PA required; PA submitted to above mentioned insurance via Latent Key/confirmation #/EOC [id number] Status is pending

## 2024-01-14 ENCOUNTER — Ambulatory Visit: Admitting: Family Medicine

## 2024-01-14 ENCOUNTER — Other Ambulatory Visit (HOSPITAL_BASED_OUTPATIENT_CLINIC_OR_DEPARTMENT_OTHER): Payer: Self-pay

## 2024-01-14 NOTE — Telephone Encounter (Signed)
 Pharmacy Patient Advocate Encounter  Received notification from [facility] that Prior Authorization for Zepbound  15mg /0.24ml has been APPROVED from 01/14/24 to 01/10/25   PA #/Case ID/Reference #: [id number]

## 2024-01-15 NOTE — Progress Notes (Deleted)
 Cardiology Office Note    Date:  01/15/2024  ID:  Devin [name], DOB 1970-10-11, MRN [id number] PCP:  [name], DO  Cardiologist:  [name], DO  Electrophysiologist:  None   Chief Complaint: ***  History of Present Illness: .    Devin [name] is a 53 y.o. male with visit-pertinent history of paroxysmal atrial fibrillation not on anticoagulation, OSA, hypertension and obesity.   Patient initially presented to atrial fibrillation clinic in 2021 after presenting to the emergency department with atrial fibrillation with RVR, spontaneously converted.  Patient was previously on propranolol  for tremors with resultant bradycardia with heart rate in the upper 30s and 40s.    Patient was later started on flecainide  50 mg twice a day.    Labwork independently reviewed:   ROS: .   *** denies chest pain, shortness of breath, lower extremity edema, fatigue, palpitations, melena, hematuria, hemoptysis, diaphoresis, weakness, presyncope, syncope, orthopnea, and PND.  All other systems are reviewed and otherwise negative.  Studies Reviewed: SABRA    EKG:  EKG is ordered today, personally reviewed, demonstrating ***     CV Studies: Cardiac studies reviewed are outlined and summarized above. Otherwise please see EMR for full report. Cardiac Studies & Procedures   ______________________________________________________________________________________________   STRESS TESTS  MYOCARDIAL PERFUSION IMAGING 02/04/2021  Interpretation Summary   The study is normal. The study is low risk.   Patient exercised according to the BRUCE protocol for 9:52min achieving 11.   Target HR was achieved (166bpm; 96% MPHR)   No ST deviation was noted.   LV perfusion is normal.   Left ventricular function is normal. End diastolic cavity size is normal.   Prior study not available for comparison.   ECHOCARDIOGRAM  ECHOCARDIOGRAM COMPLETE 08/12/2019  Narrative ECHOCARDIOGRAM  REPORT    Patient Name:   Devin [name] Date of Exam: 08/12/2019 Medical Rec #:  [id number]        Height:       70.0 in Accession #:    [id number]       Weight:       223.4 lb Date of Birth:  [birth year]       BSA:          2.188 m Patient Age:    48 years         BP:           138/74 mmHg Patient Gender: M                HR:           47 bpm. Exam Location:  Outpatient  Procedure: 2D Echo  Indications:    Atrial Fibrillation I48.91  History:        Patient has no prior history of Echocardiogram examinations. Risk Factors:Hypertension.  Sonographer:    Augustin Seals RDCS (AE) Referring Phys: [phone] DONNA C CARROLL  IMPRESSIONS   1. Left ventricular ejection fraction, by estimation, is 60 to 65%. The left ventricle has normal function. The left ventricle has no regional wall motion abnormalities. Left ventricular diastolic parameters were normal. 2. Right ventricular systolic function is normal. The right ventricular size is normal. There is normal pulmonary artery systolic pressure. The estimated right ventricular systolic pressure is 21.3 mmHg. 3. The mitral valve is normal in structure. No evidence of mitral valve regurgitation. 4. The aortic valve is tricuspid. Aortic valve regurgitation is not visualized. No aortic stenosis is present. 5. The inferior vena cava is normal  in size with <50% respiratory variability, suggesting right atrial pressure of 8 mmHg.  FINDINGS Left Ventricle: Left ventricular ejection fraction, by estimation, is 60 to 65%. The left ventricle has normal function. The left ventricle has no regional wall motion abnormalities. The left ventricular internal cavity size was normal in size. There is no left ventricular hypertrophy. Left ventricular diastolic parameters were normal.  Right Ventricle: The right ventricular size is normal. No increase in right ventricular wall thickness. Right ventricular systolic function is normal. There is normal pulmonary  artery systolic pressure. The tricuspid regurgitant velocity is 2.14 m/s, and with an assumed right atrial pressure of 3 mmHg, the estimated right ventricular systolic pressure is 21.3 mmHg.  Left Atrium: Left atrial size was normal in size.  Right Atrium: Right atrial size was normal in size.  Pericardium: There is no evidence of pericardial effusion.  Mitral Valve: The mitral valve is normal in structure. No evidence of mitral valve regurgitation.  Tricuspid Valve: The tricuspid valve is normal in structure. Tricuspid valve regurgitation is trivial.  Aortic Valve: The aortic valve is tricuspid. Aortic valve regurgitation is not visualized. No aortic stenosis is present.  Pulmonic Valve: The pulmonic valve was not well visualized. Pulmonic valve regurgitation is not visualized.  Aorta: The aortic root is normal in size and structure.  Venous: The inferior vena cava is normal in size with less than 50% respiratory variability, suggesting right atrial pressure of 8 mmHg.  IAS/Shunts: The interatrial septum was not well visualized.   LEFT VENTRICLE PLAX 2D LVIDd:         5.30 cm      Diastology LVIDs:         3.60 cm      LV e' lateral:   11.50 cm/s LV PW:         0.80 cm      LV E/e' lateral: 9.6 LV IVS:        0.80 cm      LV e' medial:    11.50 cm/s LVOT diam:     2.00 cm      LV E/e' medial:  9.6 LV SV:         88 LV SV Index:   40 LVOT Area:     3.14 cm  LV Volumes (MOD) LV vol d, MOD A2C: 71.7 ml LV vol d, MOD A4C: 113.0 ml LV vol s, MOD A2C: 25.2 ml LV vol s, MOD A4C: 52.8 ml LV SV MOD A2C:     46.5 ml LV SV MOD A4C:     113.0 ml LV SV MOD BP:      56.7 ml  RIGHT VENTRICLE RV S prime:     13.30 cm/s TAPSE (M-mode): 3.2 cm  LEFT ATRIUM             Index       RIGHT ATRIUM           Index LA diam:        3.60 cm 1.65 cm/m  RA Area:     17.80 cm LA Vol (A2C):   58.7 ml 26.83 ml/m RA Volume:   40.90 ml  18.69 ml/m LA Vol (A4C):   42.4 ml 19.38 ml/m LA  Biplane Vol: 50.9 ml 23.27 ml/m AORTIC VALVE LVOT Vmax:   106.00 cm/s LVOT Vmean:  65.900 cm/s LVOT VTI:    0.279 m  AORTA Ao Root diam: 3.10 cm  MITRAL VALVE  TRICUSPID VALVE MV Area (PHT): 4.06 cm     TR Peak grad:   18.3 mmHg MV Decel Time: 187 msec     TR Vmax:        214.00 cm/s MV E velocity: 110.00 cm/s MV A velocity: 67.70 cm/s   SHUNTS MV E/A ratio:  1.62         Systemic VTI:  0.28 m Systemic Diam: 2.00 cm  Lonni Nanas MD Electronically signed by Lonni Nanas MD Signature Date/Time: 08/12/2019/9:50:45 PM    Final    MONITORS  LONG TERM MONITOR (3-14 DAYS) 09/05/2019  Narrative Sinus rhythm Nocturnal sinus bradycardia Rare premature ventricular contractions No afib No sustained arrhythmias   CT SCANS  CT CORONARY MORPH W/CTA COR W/SCORE 09/30/2020  Addendum 09/30/2020 12:20 PM ADDENDUM REPORT: 09/30/2020 12:17  CLINICAL DATA:  This is a 53 year old male with chest pain.  EXAM: Cardiac/Coronary  CTA  TECHNIQUE: The patient was scanned on a [employer].  FINDINGS: A 100 kV prospective scan was triggered in the descending thoracic aorta at 111 HU's. Axial non-contrast 3 mm slices were carried out through the heart. The data set was analyzed on a dedicated work station and scored using the Agatson method. Gantry rotation speed was 250 msecs and collimation was .6 mm. No beta blockade and 0.8 mg of sl NTG was given. The 3D data set was reconstructed in 5% intervals of the 67-82 % of the R-R cycle. Diastolic phases were analyzed on a dedicated work station using MPR, MIP and VRT modes. The patient received 80 cc of contrast.  Aorta:  Normal size.  No calcifications.  No dissection.  Aortic Valve:  Trileaflet.  No calcifications.  Coronary Arteries:  Normal coronary origin.  Right dominance.  RCA is a large dominant artery that gives rise to PDA and PLA. There is no plaque.  Left main is a large artery  that gives rise to LAD and LCX arteries.  LAD is a large vessel that has no plaque.  LCX is a non-dominant artery that gives rise to one large OM1 branch. There is no plaque.  Other findings:  Normal pulmonary vein drainage into the left atrium.  Normal left atrial appendage without a thrombus.  Normal size of the pulmonary artery.  IMPRESSION: 1. Coronary calcium score of 0. This was 0 percentile for age and sex matched control.  2. Normal coronary origin with right dominance.  3. No evidence of CAD. CAD-RADS 0. No evidence of CAD (0%). Consider non-atherosclerotic causes of chest pain.   Electronically Signed By: Kardie  Tobb DO On: 09/30/2020 12:17  Narrative EXAM: OVER-READ INTERPRETATION  CT CHEST  The following report is an over-read performed by radiologist Dr. Toribio Aye of [facility] on 09/30/2020. This over-read does not include interpretation of cardiac or coronary anatomy or pathology. The coronary calcium score/coronary CTA interpretation by the cardiologist is attached.  COMPARISON:  None.  FINDINGS: Within the visualized portions of the thorax there are no suspicious appearing pulmonary nodules or masses, there is no acute consolidative airspace disease, no pleural effusions, no pneumothorax and no lymphadenopathy. Visualized portions of the upper abdomen are unremarkable. There are no aggressive appearing lytic or blastic lesions noted in the visualized portions of the skeleton.  IMPRESSION: No significant incidental noncardiac findings are noted.  Electronically Signed: By: Toribio Aye M.D. On: 09/30/2020 08:57     ______________________________________________________________________________________________       Current Reported Medications:.    No  outpatient medications have been marked as taking for the 01/23/24 encounter (Appointment) with Kaveon Blatz D, NP.    Physical Exam:    VS:  There were no vitals  taken for this visit.   Wt Readings from Last 3 Encounters:  11/30/23 219 lb 3.2 oz (99.4 kg)  10/15/23 212 lb 12.8 oz (96.5 kg)  07/04/23 219 lb (99.3 kg)    GEN: Well nourished, well developed in no acute distress NECK: No JVD; No carotid bruits CARDIAC: ***RRR, no murmurs, rubs, gallops RESPIRATORY:  Clear to auscultation without rales, wheezing or rhonchi  ABDOMEN: Soft, non-tender, non-distended EXTREMITIES:  No edema; No acute deformity     Asessement and Plan:.     ***     Disposition: F/u with ***  Signed, Yanice Maqueda D Dartanyon Frankowski, NP

## 2024-01-16 ENCOUNTER — Ambulatory Visit: Admitting: Family Medicine

## 2024-01-16 ENCOUNTER — Encounter: Payer: Self-pay | Admitting: Family Medicine

## 2024-01-16 VITALS — BP 128/70 | HR 67 | Temp 98.0°F | Resp 16 | Ht 69.0 in | Wt 220.2 lb

## 2024-01-16 DIAGNOSIS — F411 Generalized anxiety disorder: Secondary | ICD-10-CM | POA: Diagnosis not present

## 2024-01-16 DIAGNOSIS — Z6832 Body mass index (BMI) 32.0-32.9, adult: Secondary | ICD-10-CM

## 2024-01-16 DIAGNOSIS — Z23 Encounter for immunization: Secondary | ICD-10-CM

## 2024-01-16 DIAGNOSIS — E669 Obesity, unspecified: Secondary | ICD-10-CM | POA: Diagnosis not present

## 2024-01-16 NOTE — Progress Notes (Signed)
 Chief Complaint  Patient presents with   Medication Refill    Medication Check     Subjective: Patient is a 53 y.o. male here for f/u.  On Zepbound  15 mg/week, compliant, no AE's. Has lost >70 lbs. Diet is healthy overall. He is a corporate investment banker, very active thru work.   GAD Pt is currently being treated with Effexor  XR 37.5 mg/d.  Reports doing well since treatment. No thoughts of harming self or others. No self-medication with alcohol, prescription drugs or illicit drugs. Pt is not following with a counselor/psychologist.  Past Medical History:  Diagnosis Date   Allergic rhinitis    Anemia    not on meds at this time (06/15/2022)   Anxiety    on meds   Atrial fibrillation (HCC)    Constipation    Depression    on meds   GERD (gastroesophageal reflux disease)    hx of -random epidodes   History of MRSA infection    Hypertension    controlled   Keratoconus of both eyes    Seasonal allergies    Sleep apnea    not using CPAP at this time (06/15/2022   Tremor     Objective: BP 128/70 (BP Location: Left Arm, Patient Position: Sitting)   Pulse 67   Temp 98 F (36.7 C) (Oral)   Resp 16   Ht 5' 9 (1.753 m)   Wt 220 lb 3.2 oz (99.9 kg)   SpO2 96%   BMI 32.52 kg/m  General: Awake, appears stated age Heart: RRR, no LE edema Lungs: CTAB, no rales, wheezes or rhonchi. No accessory muscle use Psych: Age appropriate judgment and insight, normal affect and mood  Assessment and Plan: Obesity (BMI 30-39.9)  GAD (generalized anxiety disorder)  Chronic, stable.  Continue Zepbound  15 mg weekly.  Counseled on diet and exercise.  He has lost over 70 pounds since starting. Chronic, stable.  Continue Effexor  XR 37.5 mg daily. PCV 20 today. Follow-up in 6 months for physical or as needed. The patient voiced understanding and agreement to the plan.  Mabel Mt Glenn, DO 01/16/24  3:21 PM

## 2024-01-16 NOTE — Patient Instructions (Signed)
 Keep the diet clean and stay active.  Let us know if you need anything.

## 2024-01-22 ENCOUNTER — Other Ambulatory Visit (HOSPITAL_BASED_OUTPATIENT_CLINIC_OR_DEPARTMENT_OTHER): Payer: Self-pay

## 2024-01-23 ENCOUNTER — Encounter: Payer: Self-pay | Admitting: Cardiology

## 2024-01-23 ENCOUNTER — Ambulatory Visit: Attending: Cardiology | Admitting: Cardiology

## 2024-01-24 ENCOUNTER — Other Ambulatory Visit (HOSPITAL_BASED_OUTPATIENT_CLINIC_OR_DEPARTMENT_OTHER): Payer: Self-pay

## 2024-02-04 ENCOUNTER — Other Ambulatory Visit (HOSPITAL_BASED_OUTPATIENT_CLINIC_OR_DEPARTMENT_OTHER): Payer: Self-pay

## 2024-02-11 ENCOUNTER — Emergency Department (HOSPITAL_BASED_OUTPATIENT_CLINIC_OR_DEPARTMENT_OTHER)

## 2024-02-11 ENCOUNTER — Emergency Department (HOSPITAL_BASED_OUTPATIENT_CLINIC_OR_DEPARTMENT_OTHER)
Admission: EM | Admit: 2024-02-11 | Discharge: 2024-02-11 | Disposition: A | Discharge status: Home / Self Care | Attending: Emergency Medicine | Admitting: Emergency Medicine

## 2024-02-11 ENCOUNTER — Encounter (HOSPITAL_BASED_OUTPATIENT_CLINIC_OR_DEPARTMENT_OTHER): Payer: Self-pay | Admitting: Emergency Medicine

## 2024-02-11 ENCOUNTER — Other Ambulatory Visit: Payer: Self-pay

## 2024-02-11 DIAGNOSIS — Z79899 Other long term (current) drug therapy: Secondary | ICD-10-CM | POA: Diagnosis not present

## 2024-02-11 DIAGNOSIS — G510 Bell's palsy: Secondary | ICD-10-CM | POA: Insufficient documentation

## 2024-02-11 DIAGNOSIS — R200 Anesthesia of skin: Secondary | ICD-10-CM | POA: Diagnosis present

## 2024-02-11 DIAGNOSIS — I10 Essential (primary) hypertension: Secondary | ICD-10-CM | POA: Insufficient documentation

## 2024-02-11 LAB — CBC
HCT: 35.9 % — ABNORMAL LOW (ref 39.0–52.0)
Hemoglobin: 12.4 g/dL — ABNORMAL LOW (ref 13.0–17.0)
MCH: 30 pg (ref 26.0–34.0)
MCHC: 34.5 g/dL (ref 30.0–36.0)
MCV: 86.9 fL (ref 80.0–100.0)
Platelets: 296 K/uL (ref 150–400)
RBC: 4.13 MIL/uL — ABNORMAL LOW (ref 4.22–5.81)
RDW: 12.5 % (ref 11.5–15.5)
WBC: 6.8 K/uL (ref 4.0–10.5)
nRBC: 0 % (ref 0.0–0.2)

## 2024-02-11 LAB — COMPREHENSIVE METABOLIC PANEL WITH GFR
ALT: 13 U/L (ref 0–44)
AST: 16 U/L (ref 15–41)
Albumin: 4.3 g/dL (ref 3.5–5.0)
Alkaline Phosphatase: 69 U/L (ref 38–126)
Anion gap: 12 (ref 5–15)
BUN: 19 mg/dL (ref 6–20)
CO2: 24 mmol/L (ref 22–32)
Calcium: 9.1 mg/dL (ref 8.9–10.3)
Chloride: 103 mmol/L (ref 98–111)
Creatinine, Ser: 0.82 mg/dL (ref 0.61–1.24)
GFR, Estimated: >60 mL/min
Glucose, Bld: 88 mg/dL (ref 70–99)
Potassium: 3.6 mmol/L (ref 3.5–5.1)
Sodium: 139 mmol/L (ref 135–145)
Total Bilirubin: 0.3 mg/dL (ref 0.0–1.2)
Total Protein: 6.7 g/dL (ref 6.5–8.1)

## 2024-02-11 MED ORDER — SODIUM CHLORIDE 0.9 % IV BOLUS
1000.0000 mL | Freq: Once | INTRAVENOUS | Status: AC
  Administered 2024-02-11: 1000 mL via INTRAVENOUS

## 2024-02-11 MED ORDER — IOHEXOL 350 MG/ML SOLN
75.0000 mL | Freq: Once | INTRAVENOUS | Status: AC | PRN
  Administered 2024-02-11: 75 mL via INTRAVENOUS

## 2024-02-11 MED ORDER — PREDNISONE 20 MG PO TABS
ORAL_TABLET | ORAL | 0 refills | Status: AC
  Filled 2024-02-11 – 2024-02-12 (×2): qty 12, 6d supply, fill #0

## 2024-02-11 MED ORDER — VALACYCLOVIR HCL 1 G PO TABS
1000.0000 mg | ORAL_TABLET | Freq: Three times a day (TID) | ORAL | 0 refills | Status: AC
  Filled 2024-02-11 – 2024-02-12 (×2): qty 21, 7d supply, fill #0

## 2024-02-11 NOTE — ED Triage Notes (Addendum)
 Pt reports numbness in left side of face ongoing x 1 week. Reports he did have tingling & numbness in left arm yesterday but it went away. Pt has bilateral sensation & strength in triage other than L side of face. No droop noted. Reports he has pcp appointment tomorrow but they sent him to ED.

## 2024-02-11 NOTE — ED Provider Notes (Signed)
 " [facility] Provider Note   CSN: [id number] Arrival date & time: 02/11/24  1743     Patient presents with: Numbness   SEVE MONETTE is a 53 y.o. male history of hypertension here presenting with left-sided numbness.  Patient states that he has left facial numbness for about a week or so.  He states that has not gone away.  He denies any trouble speaking or focal weakness.  He called his doctor and has appointment tomorrow but was sent in for further evaluation.  Denies any chest pain or shortness of breath.  Denies any history of stroke in the past   The history is provided by the patient.       Prior to Admission medications  Medication Sig Start Date End Date Taking? Authorizing Provider  cetirizine  (ZYRTEC  ALLERGY ) 10 MG tablet Take 1 tablet (10 mg total) by mouth daily. 11/27/23   Adah Corning A, FNP  EPINEPHrine  0.3 mg/0.3 mL IJ SOAJ injection Inject 0.3 mg into the muscle as needed for anaphylaxis. As needed for life-threatening allergic reactions 03/23/22   Kozlow, Camellia PARAS, MD  flecainide  (TAMBOCOR ) 50 MG tablet Take 1 tablet (50 mg total) by mouth 2 (two) times daily. 12/17/23   West, Katlyn D, NP  losartan  (COZAAR ) 25 MG tablet Take 1 tablet (25 mg total) by mouth daily. (OVAL WHITE TABLET WITH 5 l) 12/17/23   West, Katlyn D, NP  nitroGLYCERIN  (NITROSTAT ) 0.4 MG SL tablet Place 1 tablet (0.4 mg total) under the tongue every 5 (five) minutes as needed for chest pain. 09/22/20 11/30/23  Tobb, Kardie, DO  propranolol  (INDERAL ) 10 MG tablet TAKE ONE TABLET BY MOUTH THREE TIMES DAILY. (ROUND ORANGE TABLET) 02/19/23   Tobb, Kardie, DO  tirzepatide  (ZEPBOUND ) 15 MG/0.5ML Pen Inject 15 mg into the skin once a week. 09/26/23   [name], DO  venlafaxine  XR (EFFEXOR -XR) 37.5 MG 24 hr capsule TAKE ONE CAPSULE BY MOUTH EVERY DAY WITH BREAKFAST. (PINK CAPSULE OR GRAY AND PINK CAPSULE) 10/16/23   [name], DO    Allergies:  Shellfish allergy  and Penicillins    Review of Systems  Neurological:  Positive for numbness.  All other systems reviewed and are negative.   Updated Vital Signs BP (!) 151/75 (BP Location: Right Arm)   Pulse (!) 59   Temp 97.7 F (36.5 C)   Resp 16   Ht 5' 9 (1.753 m)   Wt 99.8 kg   SpO2 100%   BMI 32.49 kg/m   Physical Exam Vitals and nursing note reviewed.  HENT:     Head: Normocephalic.     Nose: Nose normal.     Mouth/Throat:     Mouth: Mucous membranes are moist.  Eyes:     Extraocular Movements: Extraocular movements intact.     Pupils: Pupils are equal, round, and reactive to light.  Cardiovascular:     Rate and Rhythm: Normal rate and regular rhythm.     Pulses: Normal pulses.     Heart sounds: Normal heart sounds.  Pulmonary:     Effort: Pulmonary effort is normal.     Breath sounds: Normal breath sounds.  Abdominal:     General: Abdomen is flat.     Palpations: Abdomen is soft.  Musculoskeletal:        General: Normal range of motion.     Cervical back: Normal range of motion.  Skin:    General: Skin is warm.  Neurological:     Mental Status: He is alert.     Comments: Slightly decreased sensation in the left lower face.  No obvious facial droop.  No obvious trouble speaking.  Patient has normal strength and sensation bilateral arms and legs.     (all labs ordered are listed, but only abnormal results are displayed) Labs Reviewed  COMPREHENSIVE METABOLIC PANEL WITH GFR  CBC  URINALYSIS, ROUTINE W REFLEX MICROSCOPIC    EKG: None  Radiology: No results found.   Procedures   Medications Ordered in the ED  sodium chloride  0.9 % bolus 1,000 mL (1,000 mLs Intravenous New Bag/Given 02/11/24 1852)                                    Medical Decision Making VISHAL SANDLIN is a 53 y.o. male here presenting with left facial numbness.  I wonder if he had Bell's palsy versus stroke.  Symptoms going on for a week.  Will get CTA to look for LVO  or stroke or bleed.  If CTA negative, will not need MRI currently as his symptoms going on for about a week and no other deficit other than slight numbness in the left face.  10:15 PM CTA did not show any LVO.  Labs unremarkable.  I wonder if patient has Bell's palsy.  Will prescribe a course of steroids and Valtrex   Problems Addressed: Bell's palsy: acute illness or injury Left facial numbness: acute illness or injury  Amount and/or Complexity of Data Reviewed Labs: ordered. Decision-making details documented in ED Course. Radiology: ordered and independent interpretation performed. Decision-making details documented in ED Course.  Risk Prescription drug management.     Final diagnoses:  None    ED Discharge Orders     None          Patt Alm Macho, MD 02/11/24 2216  "

## 2024-02-11 NOTE — ED Notes (Signed)

## 2024-02-11 NOTE — Discharge Instructions (Addendum)
 As we discussed your CT scan did not show any obvious stroke  I think you likely have Bell's palsy  I have prescribed steroids and Valtrex   Please follow-up with your doctor as scheduled tomorrow  Return to ER if you have worse numbness or trouble speaking or weakness

## 2024-02-12 ENCOUNTER — Other Ambulatory Visit (HOSPITAL_BASED_OUTPATIENT_CLINIC_OR_DEPARTMENT_OTHER): Payer: Self-pay

## 2024-02-12 ENCOUNTER — Ambulatory Visit: Admitting: Family Medicine

## 2024-02-12 ENCOUNTER — Telehealth: Payer: Self-pay

## 2024-02-12 NOTE — Telephone Encounter (Signed)
 Initial Comment Caller states the patient has numbness and tingling on his face. Translation No Nurse Assessment Nurse: Mavis, RN, Fonda Date/Time Titus Time): 02/11/2024 3:42:42 PM Confirm and document reason for call. If symptomatic, describe symptoms. ---Patient states for 2 weeks he has had numbness and tingling of his face. Does the patient have any new or worsening symptoms? ---Yes Will a triage be completed? ---Yes Related visit to physician within the last 2 weeks? ---N/A Does the PT have any chronic conditions? (i.e. diabetes, asthma, this includes High risk factors for pregnancy, etc.) ---Yes List chronic conditions. ---A fib, HTN Is this a behavioral health or substance abuse call? ---No Guidelines Guideline Title Affirmed Question Affirmed Notes Nurse Date/Time (Eastern Time) Neurologic Deficit [1] Numbness (i.e., loss of sensation) of the face, arm / hand, or leg / foot on one side of the body AND [2] sudden onset AND [3] present now Mavis OBIE Fonda 02/11/2024 4:44:07 PM Disp. Time Titus Time) Disposition Final User 02/11/2024 4:38:57 PM Send to Urgent Vallery Larry, Melissa PLEASE NOTE: All timestamps contained within this report are represented as Eastern Standard Time. CONFIDENTIALTY NOTICE: This fax transmission is intended only for the addressee. It contains information that is legally privileged, confidential or otherwise protected from use or disclosure. If you are not the intended recipient, you are strictly prohibited from reviewing, disclosing, copying using or disseminating any of this information or taking any action in reliance on or regarding this information. If you have received this fax in error, please notify us  immediately by telephone so that we can arrange for its return to us . Phone: [phone], Toll-Free: [phone], Fax: [phone] [email or web address] Feb 05, 1971 Page: 1 of2 CallId: [id number] Disp. Time Titus Time)  Disposition Final User 02/11/2024 4:45:17 PM Call EMS 911 Now Yes Mavis OBIE Fonda 02/11/2024 4:45:34 PM 911 Outcome Documentation Mavis, RN, Fonda Reason: refused callback Final Disposition 02/11/2024 4:45:17 PM Call EMS 911 Now Yes Mavis, RN, Fonda [place] Disagree/Comply Comply Caller Understands Yes PreDisposition InappropriateToAsk Care Advice Given Per Guideline CALL EMS 911 NOW: * Immediate medical attention is needed. You need to hang up and call 911 (or an ambulance). * Triager Discretion: I'll call you back in a few minutes to be sure you were able to reach them. CARE ADVICE given per Neurologic Deficit (Adult) guideline. Comments User: Fonda Mavis, RN Date/Time Titus Time): 02/11/2024 4:46:12 PM Patient states the numbness is only one on side of his face, constant, began around 2 weeks ago, sudden onset, has not been evaluated by a provider. Patient states he scheduled an appt with client office for [facility] afternoon but they instructed him to call us  for triage

## 2024-02-12 NOTE — Telephone Encounter (Signed)
 Pt seen at ED

## 2024-02-25 ENCOUNTER — Other Ambulatory Visit: Payer: Self-pay | Admitting: Family Medicine

## 2024-03-18 ENCOUNTER — Other Ambulatory Visit: Payer: Self-pay | Admitting: Cardiology
